# Patient Record
Sex: Female | Born: 1955 | Race: White | Hispanic: No | Smoking: Never smoker
Health system: Southern US, Community
[De-identification: ages and names within clinical notes are randomized; demographics above are authoritative.]

## PROBLEM LIST (undated history)

## (undated) DIAGNOSIS — K219 Gastro-esophageal reflux disease without esophagitis: Secondary | ICD-10-CM

## (undated) DIAGNOSIS — K5790 Diverticulosis of intestine, part unspecified, without perforation or abscess without bleeding: Secondary | ICD-10-CM

## (undated) DIAGNOSIS — S92819A Other fracture of unspecified foot, initial encounter for closed fracture: Secondary | ICD-10-CM

## (undated) DIAGNOSIS — T8859XA Other complications of anesthesia, initial encounter: Secondary | ICD-10-CM

## (undated) DIAGNOSIS — Z8719 Personal history of other diseases of the digestive system: Secondary | ICD-10-CM

## (undated) DIAGNOSIS — R112 Nausea with vomiting, unspecified: Secondary | ICD-10-CM

## (undated) DIAGNOSIS — Z800 Family history of malignant neoplasm of digestive organs: Secondary | ICD-10-CM

## (undated) DIAGNOSIS — T4145XA Adverse effect of unspecified anesthetic, initial encounter: Secondary | ICD-10-CM

## (undated) DIAGNOSIS — E669 Obesity, unspecified: Secondary | ICD-10-CM

## (undated) DIAGNOSIS — Z8489 Family history of other specified conditions: Secondary | ICD-10-CM

## (undated) DIAGNOSIS — M84376A Stress fracture, unspecified foot, initial encounter for fracture: Secondary | ICD-10-CM

## (undated) DIAGNOSIS — L409 Psoriasis, unspecified: Secondary | ICD-10-CM

## (undated) DIAGNOSIS — C801 Malignant (primary) neoplasm, unspecified: Secondary | ICD-10-CM

## (undated) DIAGNOSIS — K5730 Diverticulosis of large intestine without perforation or abscess without bleeding: Secondary | ICD-10-CM

## (undated) DIAGNOSIS — I10 Essential (primary) hypertension: Secondary | ICD-10-CM

## (undated) DIAGNOSIS — Z9889 Other specified postprocedural states: Secondary | ICD-10-CM

## (undated) HISTORY — PX: CHOLECYSTECTOMY: SHX55

## (undated) HISTORY — DX: Obesity, unspecified: E66.9

## (undated) HISTORY — PX: APPENDECTOMY: SHX54

## (undated) HISTORY — DX: Family history of malignant neoplasm of digestive organs: Z80.0

## (undated) HISTORY — PX: TONSILLECTOMY: SUR1361

## (undated) HISTORY — PX: OTHER SURGICAL HISTORY: SHX169

## (undated) HISTORY — DX: Essential (primary) hypertension: I10

## (undated) HISTORY — DX: Diverticulosis of large intestine without perforation or abscess without bleeding: K57.30

## (undated) HISTORY — DX: Stress fracture, unspecified foot, initial encounter for fracture: M84.376A

## (undated) HISTORY — DX: Gastro-esophageal reflux disease without esophagitis: K21.9

## (undated) HISTORY — DX: Psoriasis, unspecified: L40.9

## (undated) HISTORY — DX: Diverticulosis of intestine, part unspecified, without perforation or abscess without bleeding: K57.90

## (undated) HISTORY — DX: Other fracture of unspecified foot, initial encounter for closed fracture: S92.819A

## (undated) HISTORY — DX: Personal history of other diseases of the digestive system: Z87.19

## (undated) HISTORY — PX: ABDOMINAL HYSTERECTOMY: SHX81

---
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
## 1998-01-04 ENCOUNTER — Emergency Department (HOSPITAL_COMMUNITY): Admission: EM | Admit: 1998-01-04 | Discharge: 1998-01-04 | Payer: Self-pay

## 1998-01-10 ENCOUNTER — Emergency Department (HOSPITAL_COMMUNITY): Admission: EM | Admit: 1998-01-10 | Discharge: 1998-01-10 | Payer: Self-pay

## 1998-01-11 ENCOUNTER — Emergency Department (HOSPITAL_COMMUNITY): Admission: EM | Admit: 1998-01-11 | Discharge: 1998-01-11 | Payer: Self-pay

## 1998-01-12 ENCOUNTER — Emergency Department (HOSPITAL_COMMUNITY): Admission: EM | Admit: 1998-01-12 | Discharge: 1998-01-12 | Payer: Self-pay

## 1998-01-13 ENCOUNTER — Emergency Department (HOSPITAL_COMMUNITY): Admission: EM | Admit: 1998-01-13 | Discharge: 1998-01-13 | Payer: Self-pay | Admitting: Emergency Medicine

## 1998-05-15 ENCOUNTER — Other Ambulatory Visit: Admission: RE | Admit: 1998-05-15 | Discharge: 1998-05-15 | Payer: Self-pay | Admitting: Gastroenterology

## 1998-06-12 ENCOUNTER — Other Ambulatory Visit: Admission: RE | Admit: 1998-06-12 | Discharge: 1998-06-12 | Payer: Self-pay | Admitting: *Deleted

## 1999-09-01 ENCOUNTER — Encounter: Payer: Self-pay | Admitting: *Deleted

## 1999-09-01 ENCOUNTER — Emergency Department (HOSPITAL_COMMUNITY): Admission: EM | Admit: 1999-09-01 | Discharge: 1999-09-01 | Payer: Self-pay | Admitting: Emergency Medicine

## 2000-02-11 ENCOUNTER — Emergency Department (HOSPITAL_COMMUNITY): Admission: EM | Admit: 2000-02-11 | Discharge: 2000-02-11 | Payer: Self-pay | Admitting: Emergency Medicine

## 2000-02-12 ENCOUNTER — Encounter: Payer: Self-pay | Admitting: Emergency Medicine

## 2000-03-03 ENCOUNTER — Encounter: Payer: Self-pay | Admitting: Surgery

## 2000-03-05 ENCOUNTER — Ambulatory Visit (HOSPITAL_COMMUNITY): Admission: RE | Admit: 2000-03-05 | Discharge: 2000-03-06 | Payer: Self-pay | Admitting: Surgery

## 2000-07-31 ENCOUNTER — Encounter (INDEPENDENT_AMBULATORY_CARE_PROVIDER_SITE_OTHER): Payer: Self-pay | Admitting: Specialist

## 2000-07-31 ENCOUNTER — Other Ambulatory Visit: Admission: RE | Admit: 2000-07-31 | Discharge: 2000-07-31 | Payer: Self-pay | Admitting: Gastroenterology

## 2001-05-10 ENCOUNTER — Emergency Department (HOSPITAL_COMMUNITY): Admission: EM | Admit: 2001-05-10 | Discharge: 2001-05-11 | Payer: Self-pay | Admitting: Emergency Medicine

## 2001-06-08 ENCOUNTER — Emergency Department (HOSPITAL_COMMUNITY): Admission: EM | Admit: 2001-06-08 | Discharge: 2001-06-08 | Payer: Self-pay | Admitting: *Deleted

## 2001-06-11 ENCOUNTER — Emergency Department (HOSPITAL_COMMUNITY): Admission: EM | Admit: 2001-06-11 | Discharge: 2001-06-11 | Payer: Self-pay

## 2003-03-28 ENCOUNTER — Encounter: Admission: RE | Admit: 2003-03-28 | Discharge: 2003-03-28 | Payer: Self-pay | Admitting: Gastroenterology

## 2003-04-24 ENCOUNTER — Emergency Department (HOSPITAL_COMMUNITY): Admission: EM | Admit: 2003-04-24 | Discharge: 2003-04-25 | Payer: Self-pay | Admitting: Internal Medicine

## 2003-05-02 ENCOUNTER — Encounter: Payer: Self-pay | Admitting: Internal Medicine

## 2003-06-19 ENCOUNTER — Emergency Department (HOSPITAL_COMMUNITY): Admission: EM | Admit: 2003-06-19 | Discharge: 2003-06-20 | Payer: Self-pay | Admitting: Emergency Medicine

## 2003-06-20 ENCOUNTER — Ambulatory Visit (HOSPITAL_COMMUNITY): Admission: RE | Admit: 2003-06-20 | Discharge: 2003-06-20 | Payer: Self-pay | Admitting: Internal Medicine

## 2003-06-26 ENCOUNTER — Encounter: Admission: RE | Admit: 2003-06-26 | Discharge: 2003-06-26 | Payer: Self-pay | Admitting: Neurosurgery

## 2003-09-02 ENCOUNTER — Emergency Department (HOSPITAL_COMMUNITY): Admission: EM | Admit: 2003-09-02 | Discharge: 2003-09-02 | Payer: Self-pay | Admitting: Emergency Medicine

## 2004-03-12 ENCOUNTER — Ambulatory Visit: Payer: Self-pay | Admitting: Internal Medicine

## 2004-05-14 ENCOUNTER — Ambulatory Visit: Payer: Self-pay | Admitting: Internal Medicine

## 2004-08-06 ENCOUNTER — Ambulatory Visit: Payer: Self-pay | Admitting: Internal Medicine

## 2004-11-05 ENCOUNTER — Ambulatory Visit: Payer: Self-pay | Admitting: Internal Medicine

## 2004-12-17 ENCOUNTER — Ambulatory Visit: Payer: Self-pay | Admitting: Internal Medicine

## 2005-01-19 ENCOUNTER — Emergency Department (HOSPITAL_COMMUNITY): Admission: EM | Admit: 2005-01-19 | Discharge: 2005-01-20 | Payer: Self-pay | Admitting: *Deleted

## 2005-01-22 ENCOUNTER — Emergency Department (HOSPITAL_COMMUNITY): Admission: EM | Admit: 2005-01-22 | Discharge: 2005-01-22 | Payer: Self-pay | Admitting: Emergency Medicine

## 2005-02-19 ENCOUNTER — Ambulatory Visit: Payer: Self-pay | Admitting: Internal Medicine

## 2005-03-18 ENCOUNTER — Ambulatory Visit: Payer: Self-pay | Admitting: Internal Medicine

## 2005-05-13 ENCOUNTER — Ambulatory Visit: Payer: Self-pay | Admitting: Internal Medicine

## 2005-06-10 ENCOUNTER — Ambulatory Visit: Payer: Self-pay | Admitting: Internal Medicine

## 2005-07-15 ENCOUNTER — Ambulatory Visit: Payer: Self-pay | Admitting: Internal Medicine

## 2005-09-16 ENCOUNTER — Ambulatory Visit: Payer: Self-pay | Admitting: Internal Medicine

## 2005-12-16 ENCOUNTER — Ambulatory Visit: Payer: Self-pay | Admitting: Internal Medicine

## 2006-03-17 ENCOUNTER — Ambulatory Visit: Payer: Self-pay | Admitting: Internal Medicine

## 2006-06-16 ENCOUNTER — Ambulatory Visit: Payer: Self-pay | Admitting: Internal Medicine

## 2006-08-11 ENCOUNTER — Ambulatory Visit: Payer: Self-pay | Admitting: Internal Medicine

## 2006-09-29 DIAGNOSIS — K219 Gastro-esophageal reflux disease without esophagitis: Secondary | ICD-10-CM

## 2006-09-29 DIAGNOSIS — I872 Venous insufficiency (chronic) (peripheral): Secondary | ICD-10-CM

## 2006-09-29 DIAGNOSIS — J45909 Unspecified asthma, uncomplicated: Secondary | ICD-10-CM

## 2006-09-29 DIAGNOSIS — L409 Psoriasis, unspecified: Secondary | ICD-10-CM

## 2006-10-27 ENCOUNTER — Ambulatory Visit: Payer: Self-pay | Admitting: Internal Medicine

## 2007-01-26 ENCOUNTER — Ambulatory Visit: Payer: Self-pay | Admitting: Internal Medicine

## 2007-06-01 ENCOUNTER — Ambulatory Visit: Payer: Self-pay | Admitting: Internal Medicine

## 2007-06-01 DIAGNOSIS — I10 Essential (primary) hypertension: Secondary | ICD-10-CM

## 2007-06-07 ENCOUNTER — Emergency Department (HOSPITAL_COMMUNITY): Admission: EM | Admit: 2007-06-07 | Discharge: 2007-06-08 | Payer: Self-pay | Admitting: Emergency Medicine

## 2007-08-31 ENCOUNTER — Ambulatory Visit: Payer: Self-pay | Admitting: Internal Medicine

## 2007-11-23 ENCOUNTER — Ambulatory Visit: Payer: Self-pay | Admitting: Internal Medicine

## 2007-11-23 LAB — CONVERTED CEMR LAB
ALT: 27 units/L (ref 0–35)
Alkaline Phosphatase: 89 units/L (ref 39–117)
BUN: 12 mg/dL (ref 6–23)
Bilirubin, Direct: 0.1 mg/dL (ref 0.0–0.3)
CO2: 32 meq/L (ref 19–32)
Eosinophils Absolute: 0.2 10*3/uL (ref 0.0–0.7)
Glucose, Bld: 92 mg/dL (ref 70–99)
HCT: 36.7 % (ref 36.0–46.0)
HDL: 57.4 mg/dL (ref >39.0)
Lymphocytes Relative: 24.9 % (ref 12.0–46.0)
MCHC: 34.1 g/dL (ref 30.0–36.0)
Monocytes Absolute: 0.4 10*3/uL (ref 0.1–1.0)
Neutro Abs: 3.7 10*3/uL (ref 1.4–7.7)
Nitrite: NEGATIVE
RBC: 4.12 M/uL (ref 3.87–5.11)
RDW: 13.8 % (ref 11.5–14.6)
Sodium: 141 meq/L (ref 135–145)
TSH: 3.27 microintl units/mL (ref 0.35–5.50)
Total Bilirubin: 0.6 mg/dL (ref 0.3–1.2)
Triglycerides: 95 mg/dL (ref 0–149)
Urobilinogen, UA: 0.2
VLDL: 19 mg/dL (ref 0–40)
WBC: 5.7 10*3/uL (ref 4.5–10.5)
pH: 5.5

## 2007-11-30 ENCOUNTER — Ambulatory Visit: Payer: Self-pay | Admitting: Internal Medicine

## 2007-12-07 ENCOUNTER — Encounter: Admission: RE | Admit: 2007-12-07 | Discharge: 2007-12-07 | Payer: Self-pay | Admitting: Internal Medicine

## 2008-02-29 ENCOUNTER — Ambulatory Visit: Payer: Self-pay | Admitting: Internal Medicine

## 2008-02-29 DIAGNOSIS — J449 Chronic obstructive pulmonary disease, unspecified: Secondary | ICD-10-CM

## 2008-02-29 DIAGNOSIS — R05 Cough: Secondary | ICD-10-CM

## 2008-03-30 ENCOUNTER — Telehealth (INDEPENDENT_AMBULATORY_CARE_PROVIDER_SITE_OTHER): Payer: Self-pay | Admitting: *Deleted

## 2008-03-31 ENCOUNTER — Encounter (INDEPENDENT_AMBULATORY_CARE_PROVIDER_SITE_OTHER): Payer: Self-pay | Admitting: *Deleted

## 2008-04-11 ENCOUNTER — Ambulatory Visit: Payer: Self-pay | Admitting: Gastroenterology

## 2008-04-26 ENCOUNTER — Ambulatory Visit: Payer: Self-pay | Admitting: Gastroenterology

## 2008-04-26 LAB — HM COLONOSCOPY

## 2008-05-30 ENCOUNTER — Ambulatory Visit: Payer: Self-pay | Admitting: Internal Medicine

## 2008-05-30 DIAGNOSIS — K5730 Diverticulosis of large intestine without perforation or abscess without bleeding: Secondary | ICD-10-CM | POA: Insufficient documentation

## 2008-05-30 DIAGNOSIS — Z8719 Personal history of other diseases of the digestive system: Secondary | ICD-10-CM

## 2008-05-30 DIAGNOSIS — M79609 Pain in unspecified limb: Secondary | ICD-10-CM

## 2008-05-30 HISTORY — DX: Diverticulosis of large intestine without perforation or abscess without bleeding: K57.30

## 2008-05-30 HISTORY — DX: Personal history of other diseases of the digestive system: Z87.19

## 2008-07-05 ENCOUNTER — Encounter: Payer: Self-pay | Admitting: Internal Medicine

## 2008-08-22 ENCOUNTER — Telehealth: Payer: Self-pay | Admitting: Gastroenterology

## 2008-11-28 ENCOUNTER — Ambulatory Visit: Payer: Self-pay | Admitting: Internal Medicine

## 2008-11-28 LAB — CONVERTED CEMR LAB
ALT: 25 units/L (ref 0–35)
BUN: 10 mg/dL (ref 6–23)
Basophils Relative: 0.5 % (ref 0.0–3.0)
Bilirubin Urine: NEGATIVE
CO2: 30 meq/L (ref 19–32)
Creatinine, Ser: 0.6 mg/dL (ref 0.4–1.2)
Eosinophils Relative: 3 % (ref 0.0–5.0)
HCT: 38 % (ref 36.0–46.0)
HDL: 51.6 mg/dL (ref >39.00)
Ketones, urine, test strip: NEGATIVE
LDL Cholesterol: 120 mg/dL — ABNORMAL HIGH (ref 0–99)
Lymphs Abs: 1.7 10*3/uL (ref 0.7–4.0)
MCHC: 33.6 g/dL (ref 30.0–36.0)
MCV: 91.9 fL (ref 78.0–100.0)
Monocytes Absolute: 0.3 10*3/uL (ref 0.1–1.0)
Monocytes Relative: 4.8 % (ref 3.0–12.0)
Neutro Abs: 3.8 10*3/uL (ref 1.4–7.7)
Neutrophils Relative %: 64.2 % (ref 43.0–77.0)
RBC: 4.13 M/uL (ref 3.87–5.11)
Sodium: 144 meq/L (ref 135–145)
Specific Gravity, Urine: 1.02
Total Bilirubin: 0.9 mg/dL (ref 0.3–1.2)
Total CHOL/HDL Ratio: 4
Triglycerides: 115 mg/dL (ref 0.0–149.0)
WBC Urine, dipstick: NEGATIVE

## 2008-12-05 ENCOUNTER — Ambulatory Visit: Payer: Self-pay | Admitting: Internal Medicine

## 2008-12-05 DIAGNOSIS — M8580 Other specified disorders of bone density and structure, unspecified site: Secondary | ICD-10-CM

## 2008-12-05 DIAGNOSIS — M84376A Stress fracture, unspecified foot, initial encounter for fracture: Secondary | ICD-10-CM

## 2008-12-05 HISTORY — DX: Stress fracture, unspecified foot, initial encounter for fracture: M84.376A

## 2008-12-12 ENCOUNTER — Encounter: Payer: Self-pay | Admitting: Internal Medicine

## 2008-12-12 ENCOUNTER — Ambulatory Visit: Payer: Self-pay | Admitting: Family Medicine

## 2009-03-06 ENCOUNTER — Ambulatory Visit: Payer: Self-pay | Admitting: Internal Medicine

## 2009-04-07 ENCOUNTER — Emergency Department (HOSPITAL_COMMUNITY): Admission: EM | Admit: 2009-04-07 | Discharge: 2009-04-08 | Payer: Self-pay | Admitting: Emergency Medicine

## 2009-04-09 ENCOUNTER — Telehealth: Payer: Self-pay | Admitting: Internal Medicine

## 2009-04-10 ENCOUNTER — Ambulatory Visit: Payer: Self-pay | Admitting: Family Medicine

## 2009-04-24 ENCOUNTER — Ambulatory Visit: Payer: Self-pay | Admitting: Internal Medicine

## 2009-04-24 LAB — CONVERTED CEMR LAB
BUN: 7 mg/dL (ref 6–23)
CO2: 33 meq/L — ABNORMAL HIGH (ref 19–32)
Calcium: 8.9 mg/dL (ref 8.4–10.5)
Chloride: 104 meq/L (ref 96–112)
Creatinine, Ser: 0.7 mg/dL (ref 0.4–1.2)
GFR calc non Af Amer: 92.11 mL/min (ref >60)
Glucose, Bld: 96 mg/dL (ref 70–99)
Potassium: 3.6 meq/L (ref 3.5–5.1)
Sodium: 141 meq/L (ref 135–145)

## 2009-04-30 LAB — CONVERTED CEMR LAB: Vit D, 25-Hydroxy: 17 ng/mL — ABNORMAL LOW (ref 30–89)

## 2009-05-01 ENCOUNTER — Ambulatory Visit: Payer: Self-pay | Admitting: Internal Medicine

## 2009-05-15 ENCOUNTER — Telehealth: Payer: Self-pay | Admitting: Internal Medicine

## 2009-06-26 ENCOUNTER — Ambulatory Visit: Payer: Self-pay | Admitting: Internal Medicine

## 2009-06-26 DIAGNOSIS — B070 Plantar wart: Secondary | ICD-10-CM

## 2009-06-26 LAB — CONVERTED CEMR LAB
Basophils Absolute: 0 10*3/uL (ref 0.0–0.1)
Basophils Relative: 0.3 % (ref 0.0–3.0)
CO2: 33 meq/L — ABNORMAL HIGH (ref 19–32)
Creatinine, Ser: 0.5 mg/dL (ref 0.4–1.2)
Eosinophils Relative: 1.9 % (ref 0.0–5.0)
Glucose, Bld: 96 mg/dL (ref 70–99)
Hemoglobin: 13 g/dL (ref 12.0–15.0)
Lymphocytes Relative: 27.2 % (ref 12.0–46.0)
Monocytes Absolute: 0.4 10*3/uL (ref 0.1–1.0)
Monocytes Relative: 5.5 % (ref 3.0–12.0)
Neutro Abs: 4.8 10*3/uL (ref 1.4–7.7)
Platelets: 239 10*3/uL (ref 150.0–400.0)
Potassium: 4.1 meq/L (ref 3.5–5.1)
Sodium: 143 meq/L (ref 135–145)
WBC: 7.3 10*3/uL (ref 4.5–10.5)

## 2009-07-02 LAB — CONVERTED CEMR LAB: Vit D, 25-Hydroxy: 19 ng/mL — ABNORMAL LOW (ref 30–89)

## 2009-09-24 ENCOUNTER — Ambulatory Visit: Payer: Self-pay | Admitting: Internal Medicine

## 2009-09-24 DIAGNOSIS — N3281 Overactive bladder: Secondary | ICD-10-CM

## 2009-09-24 LAB — CONVERTED CEMR LAB
Eosinophils Absolute: 0.2 10*3/uL (ref 0.0–0.7)
Eosinophils Relative: 2.5 % (ref 0.0–5.0)
GFR calc non Af Amer: 142.15 mL/min (ref >60)
Glucose, Bld: 96 mg/dL (ref 70–99)
HCT: 37.7 % (ref 36.0–46.0)
MCV: 92.1 fL (ref 78.0–100.0)
Monocytes Absolute: 0.4 10*3/uL (ref 0.1–1.0)
Neutro Abs: 4.1 10*3/uL (ref 1.4–7.7)
Platelets: 206 10*3/uL (ref 150.0–400.0)
Potassium: 4.1 meq/L (ref 3.5–5.1)
RBC: 4.1 M/uL (ref 3.87–5.11)
RDW: 14.5 % (ref 11.5–14.6)

## 2009-11-20 ENCOUNTER — Ambulatory Visit: Payer: Self-pay | Admitting: Internal Medicine

## 2009-11-20 DIAGNOSIS — E559 Vitamin D deficiency, unspecified: Secondary | ICD-10-CM

## 2009-11-20 LAB — CONVERTED CEMR LAB
BUN: 11 mg/dL (ref 6–23)
Chloride: 104 meq/L (ref 96–112)
GFR calc non Af Amer: 114.2 mL/min (ref >60)
Glucose, Bld: 85 mg/dL (ref 70–99)
Potassium: 3.5 meq/L (ref 3.5–5.1)
Sodium: 141 meq/L (ref 135–145)

## 2009-12-01 ENCOUNTER — Emergency Department (HOSPITAL_COMMUNITY): Admission: EM | Admit: 2009-12-01 | Discharge: 2009-12-02 | Payer: Self-pay | Admitting: Emergency Medicine

## 2010-02-19 ENCOUNTER — Ambulatory Visit
Admission: RE | Admit: 2010-02-19 | Discharge: 2010-02-19 | Payer: Self-pay | Source: Home / Self Care | Attending: Internal Medicine | Admitting: Internal Medicine

## 2010-03-21 NOTE — Assessment & Plan Note (Signed)
Summary: ER follow up for bacterial inf in toe/per [place]e/cjr   Vital Signs:  Patient profile:   55 year old female Temp:     98 degrees F oral BP sitting:   120 / 78  (left arm) Cuff size:   large  Vitals Entered By: Sid Falcon LPN (April 10, 2009 9:30 AM) CC: Right middle toe infection follow-up   History of Present Illness: This patient is seen for urgent care followup. Last Thursday she noticed redness and a couple of pimple-like lesions right third toe. Patient went to urgent care placed on Bactrim. No cultures done. Antibiotics were started Saturday. Toe improved since then. No history of MRSA. Denies any fevers or chills. No history diabetes  Allergies: 1)  ! Advil (Ibuprofen) 2)  ! Aspirin 3)  ! Hyzaar (Losartan Potassium-Hctz) 4)  ! Strawberry Flavor (Flavoring Agent) 5)  ! * Golytely  Past History:  Past Medical History: Last updated: 12/05/2008 Asthma obesity psorisis venous stasis dematitis GERD Hypertension Diverticulitis, hx of Diverticulosis, colon stress fracture of the foot PMH reviewed for relevance  Review of Systems      See HPI  Physical Exam  General:  Well-developed,well-nourished,in no acute distress; alert,appropriate and cooperative throughout examination Extremities:  right third toe reveals minimal erythema distally. She has one very small pimple-like lesion which appears to be drying up. No fluctuance. No tenderness. No erythema involving the foot   Impression & Recommendations:  Problem # 1:  CELLULITIS, TOE (ICD-681.10) Assessment Improved finish out antibiotics. Frequent elevation. Follow up as needed  Complete Medication List: 1)  Enalapril-hydrochlorothiazide 5-12.5 Mg Tabs (Enalapril-hydrochlorothiazide) .... Once daily 2)  Calcium-vitamin D 600-200 Mg-unit Tabs (Calcium-vitamin d) .... Once daily 3)  Singulair 10 Mg Tabs (Montelukast sodium) .... Take 1 tablet by mouth once a day 4)  Advair Diskus 250-50 Mcg/dose Misc  (Fluticasone-salmeterol) .... Two times a day 5)  Furosemide 40 Mg Tabs (Furosemide) .... Once daily 6)  Multivitamins Caps (Multiple vitamin) .... Once daily 7)  Proair Hfa 108 (90 Base) Mcg/act Aers (Albuterol sulfate) .... As needed 8)  Ergocalciferol 50000 Unit Caps (Ergocalciferol) .... One by mouth weekly 9)  Tramadol Hcl 50 Mg Tabs (Tramadol hcl) .... One by mouth three times a day as needed pain  Patient Instructions: 1)  elevate foot frequently. 2)  Finish out antibiotics 3)  Follow up promptly if you notice any increased redness, warmth, or fever.

## 2010-03-21 NOTE — Progress Notes (Signed)
Summary: med request  Phone Note Call from Patient   Caller: Windy Canny, sister-in-law, [phone] Summary of Call: Mother passed yesterday.  Requesting med to help with the situation, a little something to take the edge off.  All tore up & give out, sitting at hospital a week +.  [facility] [address]  Allergic to aloe vera & see list.   Initial call taken by: Rudy Jew, RN,  May 15, 2009 9:29 AM  Follow-up for Phone Call        per dr Lovell Sheehan- may have xanax.25mg , #20-1 every 4-6 hours as needed for axiety Follow-up by: Willy Eddy, LPN,  May 15, 2009 9:34 AM  Additional Follow-up for Phone Call Additional follow up Details #1::        Phone Call Completed Additional Follow-up by: Rudy Jew, RN,  May 15, 2009 9:48 AM    New/Updated Medications: ALPRAZOLAM 0.25 MG TABS (ALPRAZOLAM) One every 4-6 hours as needed anxiety Prescriptions: ALPRAZOLAM 0.25 MG TABS (ALPRAZOLAM) One every 4-6 hours as needed anxiety  #20 x 0   Entered by:   Rudy Jew, RN   Authorized by:   Stacie Glaze MD   Signed by:   Rudy Jew, RN on 05/15/2009   Method used:   Telephoned to ...       [facility] [address] [phone]* (retail)       [address]       [place], [place]  [address]       Ph: [phone]       Fax: [phone]   RxID:   [id number]

## 2010-03-21 NOTE — Assessment & Plan Note (Signed)
Summary: 3 month fup//ccm   Vital Signs:  Patient profile:   55 year old female Height:      59 inches Weight:      284 pounds BMI:     57.57 Temp:     98.2 degrees F oral Pulse rate:   76 / minute Resp:     16 per minute BP sitting:   140 / 80  (left arm) Cuff size:   large  Vitals Entered By: Willy Eddy, LPN (February 19, 2010 11:52 AM) CC: roa, Hypertension Management Is Patient Diabetic? No   Primary Care Provider:  Stacie Glaze MD  CC:  roa and Hypertension Management.  History of Present Illness: Asthma has been stable but has a cold with increased cough thta has flaired her asthma no fever or chills The pt was exposed by her grandchild with a virus.... the infection started with a sore throat HTN has been stable Has been on dayquil for the cough   Hypertension History:      She denies headache, chest pain, palpitations, dyspnea with exertion, orthopnea, PND, peripheral edema, visual symptoms, neurologic problems, syncope, and side effects from treatment.        Positive major cardiovascular risk factors include female age 43 years old or older and hypertension.  Negative major cardiovascular risk factors include non-tobacco-user status.     Preventive Screening-Counseling & Management  Alcohol-Tobacco     Smoking Status: never  Problems Prior to Update: 1)  Unspecified Vitamin D Deficiency  (ICD-268.9) 2)  Urinary Incontinence, Stress, Female  (ICD-625.6) 3)  Plantar Wart, Right  (ICD-078.12) 4)  Osteopenia  (ICD-733.90) 5)  Stress Fracture, Foot  (ICD-733.94) 6)  Foot Pain, Bilateral  (ICD-729.5) 7)  Diverticulosis, Colon  (ICD-562.10) 8)  Diverticulitis, Hx of  (ICD-V12.79) 9)  Chronic Obstructive Asthma With Exacerbation  (ICD-493.22) 10)  Cough  (ICD-786.2) 11)  Preventive Health Care  (ICD-V70.0) 12)  Stasis Dermatitis, Chronic  (ICD-459.81) 13)  Hypertension  (ICD-401.9) 14)  Family History of Cervical Cancer  (ICD-V17.3) 15)   Insufficiency, Venous Nos  (ICD-459.81) 16)  Psoriasis  (ICD-696.1) 17)  Obesity, Morbid  (ICD-278.01) 18)  Gerd  (ICD-530.81) 19)  Asthma  (ICD-493.90)  Current Problems (verified): 1)  Unspecified Vitamin D Deficiency  (ICD-268.9) 2)  Urinary Incontinence, Stress, Female  (ICD-625.6) 3)  Plantar Wart, Right  (ICD-078.12) 4)  Osteopenia  (ICD-733.90) 5)  Stress Fracture, Foot  (ICD-733.94) 6)  Foot Pain, Bilateral  (ICD-729.5) 7)  Diverticulosis, Colon  (ICD-562.10) 8)  Diverticulitis, Hx of  (ICD-V12.79) 9)  Chronic Obstructive Asthma With Exacerbation  (ICD-493.22) 10)  Cough  (ICD-786.2) 11)  Preventive Health Care  (ICD-V70.0) 12)  Stasis Dermatitis, Chronic  (ICD-459.81) 13)  Hypertension  (ICD-401.9) 14)  Family History of Cervical Cancer  (ICD-V17.3) 15)  Insufficiency, Venous Nos  (ICD-459.81) 16)  Psoriasis  (ICD-696.1) 17)  Obesity, Morbid  (ICD-278.01) 18)  Gerd  (ICD-530.81) 19)  Asthma  (ICD-493.90)  Medications Prior to Update: 1)  Enalapril-Hydrochlorothiazide 5-12.5 Mg Tabs (Enalapril-Hydrochlorothiazide) .... Once Daily 2)  Calcium-Vitamin D 600-200 Mg-Unit  Tabs (Calcium-Vitamin D) .... Once Daily 3)  Singulair 10 Mg  Tabs (Montelukast Sodium) .... Take 1 Tablet By Mouth Once A Day 4)  Advair Diskus 250-50 Mcg/dose  Misc (Fluticasone-Salmeterol) .... Two Times A Day 5)  Furosemide 40 Mg  Tabs (Furosemide) .... Once Daily 6)  Multivitamins   Caps (Multiple Vitamin) .... Once Daily 7)  Proair Hfa 108 (90 Base) Mcg/act  Aers (Albuterol Sulfate) .... As Needed 8)  Ergocalciferol 50000 Unit Caps (Ergocalciferol) .... One By Mouth Twice A Week On Monday and Thursday 9)  Tramadol Hcl 50 Mg Tabs (Tramadol Hcl) .... One By Mouth Three Times A Day As Needed Pain 10)  B Complex-B12  Tabs (B Complex Vitamins) .Marland Kitchen.. 1 Once Daily 11)  Vesicare 5 Mg Tabs (Solifenacin Succinate) .... One By Mouth Daily 12)  Vitamin D 1000 Unit Tabs (Cholecalciferol) .... 2 Once Daily 13)   Boniva 150 Mg Tabs (Ibandronate Sodium) .... One By Mouth Each Monht On The Same Calendar Day 30 Min Before Eating in The Am  Current Medications (verified): 1)  Enalapril-Hydrochlorothiazide 5-12.5 Mg Tabs (Enalapril-Hydrochlorothiazide) .... Once Daily 2)  Calcium-Vitamin D 600-200 Mg-Unit  Tabs (Calcium-Vitamin D) .... Once Daily 3)  Singulair 10 Mg  Tabs (Montelukast Sodium) .... Take 1 Tablet By Mouth Once A Day 4)  Advair Diskus 250-50 Mcg/dose  Misc (Fluticasone-Salmeterol) .... Two Times A Day 5)  Furosemide 40 Mg  Tabs (Furosemide) .... Once Daily 6)  Multivitamins   Caps (Multiple Vitamin) .... Once Daily 7)  Proair Hfa 108 (90 Base) Mcg/act  Aers (Albuterol Sulfate) .... As Needed 8)  Ergocalciferol 50000 Unit Caps (Ergocalciferol) .... One By Mouth Twice A Week On Monday and Thursday 9)  Tramadol Hcl 50 Mg Tabs (Tramadol Hcl) .... One By Mouth Three Times A Day As Needed Pain 10)  B Complex-B12  Tabs (B Complex Vitamins) .Marland Kitchen.. 1 Once Daily 11)  Vesicare 5 Mg Tabs (Solifenacin Succinate) .... One By Mouth Daily 12)  Vitamin D 1000 Unit Tabs (Cholecalciferol) .... 2 Once Daily 13)  Boniva 150 Mg Tabs (Ibandronate Sodium) .... One By Mouth Each Monht On The Same Calendar Day 30 Min Before Eating in The Am  Allergies (verified): 1)  ! Advil (Ibuprofen) 2)  ! Aspirin 3)  ! Hyzaar (Losartan Potassium-Hctz) 4)  ! Strawberry Flavor (Flavoring Agent) 5)  ! * Golytely  Past History:  Family History: Last updated: 10/27/2006 mother Family History Hypertension obesity Family History of Stroke F 1st degree relative  father died from CAD in 67's  Social History: Last updated: 10/27/2006 Married Never Smoked Alcohol use-no Drug use-no Regular exercise-no  Risk Factors: Exercise: no (10/27/2006)  Risk Factors: Smoking Status: never (02/19/2010)  Past medical, surgical, family and social histories (including risk factors) reviewed, and no changes noted (except as noted  below).  Past Medical History: Reviewed history from 12/05/2008 and no changes required. Asthma obesity psorisis venous stasis dematitis GERD Hypertension Diverticulitis, hx of Diverticulosis, colon stress fracture of the foot  Past Surgical History: Reviewed history from 10/27/2006 and no changes required. Appendectomy Cholecystectomy Hysterectomy Tonsillectomy arthroscopic surgery to knee  Family History: Reviewed history from 10/27/2006 and no changes required. mother Family History Hypertension obesity Family History of Stroke F 1st degree relative  father died from CAD in 82's  Social History: Reviewed history from 10/27/2006 and no changes required. Married Never Smoked Alcohol use-no Drug use-no Regular exercise-no  Review of Systems       The patient complains of weight gain, dyspnea on exertion, prolonged cough, and headaches.  The patient denies anorexia, fever, weight loss, vision loss, decreased hearing, hoarseness, chest pain, syncope, peripheral edema, hemoptysis, abdominal pain, melena, hematochezia, severe indigestion/heartburn, hematuria, incontinence, genital sores, muscle weakness, suspicious skin lesions, transient blindness, difficulty walking, depression, unusual weight change, abnormal bleeding, enlarged lymph nodes, angioedema, breast masses, and testicular masses.    Physical Exam  General:  alert and overweight-appearing.   Head:  normocephalic and atraumatic.   Eyes:  pupils equal and pupils round.   Ears:  R ear normal and L ear normal.   Nose:  mucosal erythema and mucosal edema.   Mouth:  pharynx pink and moist and no erythema.   Neck:  No deformities, masses, or tenderness noted. Lungs:  normal respiratory effort and no wheezes.     Impression & Recommendations:  Problem # 1:  COUGH (ICD-786.2) Assessment Deteriorated moderately severe  Problem # 2:  CHRONIC OBSTRUCTIVE ASTHMA WITH EXACERBATION (ZOX-096.04) Assessment:  Deteriorated samples given Her updated medication list for this problem includes:    Singulair 10 Mg Tabs (Montelukast sodium) .Marland Kitchen... Take 1 tablet by mouth once a day    Advair Diskus 250-50 Mcg/dose Misc (Fluticasone-salmeterol) .Marland Kitchen..Marland Kitchen Two times a day    Proair Hfa 108 (90 Base) Mcg/act Aers (Albuterol sulfate) .Marland Kitchen... As needed  Current Asthma Management Plan:  Green Zone:  ADVAIR DISKUS 250-50 MCG/DOSE  MISC SINGULAIR 10 MG  TABS Yellow Zone:  PROAIR HFA 108 (90 BASE) MCG/ACT  AERS:  2 puffs every 3-4 hours (yellow zone)  Problem # 3:  HYPERTENSION (ICD-401.9) Assessment: Unchanged  Her updated medication list for this problem includes:    Enalapril-hydrochlorothiazide 5-12.5 Mg Tabs (Enalapril-hydrochlorothiazide) ..... Once daily    Furosemide 40 Mg Tabs (Furosemide) ..... Once daily  BP today: 140/80 Prior BP: 140/80 (11/20/2009)  Prior 10 Yr Risk Heart Disease: 11 % (06/26/2009)  Labs Reviewed: K+: 3.5 (11/20/2009) Creat: : 0.6 (11/20/2009)   Chol: 195 (11/28/2008)   HDL: 51.60 (11/28/2008)   LDL: 120 (11/28/2008)   TG: 115.0 (11/28/2008)  Complete Medication List: 1)  Enalapril-hydrochlorothiazide 5-12.5 Mg Tabs (Enalapril-hydrochlorothiazide) .... Once daily 2)  Calcium-vitamin D 600-200 Mg-unit Tabs (Calcium-vitamin d) .... Once daily 3)  Singulair 10 Mg Tabs (Montelukast sodium) .... Take 1 tablet by mouth once a day 4)  Advair Diskus 250-50 Mcg/dose Misc (Fluticasone-salmeterol) .... Two times a day 5)  Furosemide 40 Mg Tabs (Furosemide) .... Once daily 6)  Multivitamins Caps (Multiple vitamin) .... Once daily 7)  Proair Hfa 108 (90 Base) Mcg/act Aers (Albuterol sulfate) .... As needed 8)  Ergocalciferol 50000 Unit Caps (Ergocalciferol) .... One by mouth twice a week on monday and thursday 9)  Tramadol Hcl 50 Mg Tabs (Tramadol hcl) .... One by mouth three times a day as needed pain 10)  B Complex-b12 Tabs (B complex vitamins) .Marland Kitchen.. 1 once daily 11)  Vesicare 5 Mg  Tabs (Solifenacin succinate) .... One by mouth daily 12)  Vitamin D 1000 Unit Tabs (Cholecalciferol) .... 2 once daily 13)  Boniva 150 Mg Tabs (Ibandronate sodium) .... One by mouth each monht on the same calendar day 30 min before eating in the am 14)  Amoxicillin 500 Mg Caps (Amoxicillin) .... One by mouth three times a day for 10 days  Asthma Management Plan    Asthma Severity: Mild Persistent    Control Assessment: Not Well Controlled    Plan based on PEF formula: [place] and Dinah Beers Zone:ADVAIR DISKUS 250-50 MCG/DOSE  MISC SINGULAIR 10 MG  TABS  Yellow Zone: PROAIR HFA 108 (90 BASE) MCG/ACT  AERS:  2 puffs every 3-4 hours (yellow zone)  Hypertension Assessment/Plan:      The patient's hypertensive risk group is category B: At least one risk factor (excluding diabetes) with no target organ damage.  Her calculated 10 year risk of coronary heart disease is 11 %.  Today's blood pressure  is 140/80.  Her blood pressure goal is < 140/90.  Patient Instructions: 1)  Please schedule a follow-up appointment in 3 months. Prescriptions: AMOXICILLIN 500 MG CAPS (AMOXICILLIN) one by mouth three times a day for 10 days  #30 x 0   Entered and Authorized by:   Stacie Glaze MD   Signed by:   Stacie Glaze MD on 02/19/2010   Method used:   Electronically to        [facility] [phone]* (retail)       [address]       [place], [place]  [address]       Ph: [phone]       Fax: [phone]   RxID:   [id number]    Orders Added: 1)  Est. Patient Level IV [27253]

## 2010-03-21 NOTE — Assessment & Plan Note (Signed)
Summary: 2 month fup//ccm   Vital Signs:  Patient profile:   55 year old female Height:      59 inches Weight:      286 pounds BMI:     57.97 Temp:     98.2 degrees F oral Pulse rate:   76 / minute Resp:     16 per minute BP sitting:   140 / 80  (left arm)  Vitals Entered By: Willy Eddy, LPN (November 20, 2009 11:47 AM) CC: roa, Hypertension Management Is Patient Diabetic? No   Primary Care Provider:  Stacie Glaze MD  CC:  roa and Hypertension Management.  History of Present Illness: had been on 400 mg and has been taking 5 a day until she uses them up ( ran out sunday) she was treated for stress fracture she has been seeing the podiatriast she foot pain has worsened increased pain in foot and increased swelling Bone density showed osteoposis and osteopenia   Hypertension History:      She denies headache, chest pain, palpitations, dyspnea with exertion, orthopnea, PND, peripheral edema, visual symptoms, neurologic problems, syncope, and side effects from treatment.        Positive major cardiovascular risk factors include female age 67 years old or older and hypertension.  Negative major cardiovascular risk factors include non-tobacco-user status.     Preventive Screening-Counseling & Management  Alcohol-Tobacco     Smoking Status: never  Problems Prior to Update: 1)  Urinary Incontinence, Stress, Female  (ICD-625.6) 2)  Plantar Wart, Right  (ICD-078.12) 3)  Osteopenia  (ICD-733.90) 4)  Stress Fracture, Foot  (ICD-733.94) 5)  Foot Pain, Bilateral  (ICD-729.5) 6)  Diverticulosis, Colon  (ICD-562.10) 7)  Diverticulitis, Hx of  (ICD-V12.79) 8)  Chronic Obstructive Asthma With Exacerbation  (ICD-493.22) 9)  Cough  (ICD-786.2) 10)  Preventive Health Care  (ICD-V70.0) 11)  Stasis Dermatitis, Chronic  (ICD-459.81) 12)  Hypertension  (ICD-401.9) 13)  Family History of Cervical Cancer  (ICD-V17.3) 14)  Insufficiency, Venous Nos  (ICD-459.81) 15)  Psoriasis   (ICD-696.1) 16)  Obesity, Morbid  (ICD-278.01) 17)  Gerd  (ICD-530.81) 18)  Asthma  (ICD-493.90)  Current Problems (verified): 1)  Urinary Incontinence, Stress, Female  (ICD-625.6) 2)  Plantar Wart, Right  (ICD-078.12) 3)  Osteopenia  (ICD-733.90) 4)  Stress Fracture, Foot  (ICD-733.94) 5)  Foot Pain, Bilateral  (ICD-729.5) 6)  Diverticulosis, Colon  (ICD-562.10) 7)  Diverticulitis, Hx of  (ICD-V12.79) 8)  Chronic Obstructive Asthma With Exacerbation  (ICD-493.22) 9)  Cough  (ICD-786.2) 10)  Preventive Health Care  (ICD-V70.0) 11)  Stasis Dermatitis, Chronic  (ICD-459.81) 12)  Hypertension  (ICD-401.9) 13)  Family History of Cervical Cancer  (ICD-V17.3) 14)  Insufficiency, Venous Nos  (ICD-459.81) 15)  Psoriasis  (ICD-696.1) 16)  Obesity, Morbid  (ICD-278.01) 17)  Gerd  (ICD-530.81) 18)  Asthma  (ICD-493.90)  Medications Prior to Update: 1)  Enalapril-Hydrochlorothiazide 5-12.5 Mg Tabs (Enalapril-Hydrochlorothiazide) .... Once Daily 2)  Calcium-Vitamin D 600-200 Mg-Unit  Tabs (Calcium-Vitamin D) .... Once Daily 3)  Singulair 10 Mg  Tabs (Montelukast Sodium) .... Take 1 Tablet By Mouth Once A Day 4)  Advair Diskus 250-50 Mcg/dose  Misc (Fluticasone-Salmeterol) .... Two Times A Day 5)  Furosemide 40 Mg  Tabs (Furosemide) .... Once Daily 6)  Multivitamins   Caps (Multiple Vitamin) .... Once Daily 7)  Proair Hfa 108 (90 Base) Mcg/act  Aers (Albuterol Sulfate) .... As Needed 8)  Ergocalciferol 50000 Unit Caps (Ergocalciferol) .... One By Mouth  Twice A Week On Monday and Thursday 9)  Tramadol Hcl 50 Mg Tabs (Tramadol Hcl) .... One By Mouth Three Times A Day As Needed Pain 10)  B Complex-B12  Tabs (B Complex Vitamins) .Marland Kitchen.. 1 Once Daily 11)  Vesicare 5 Mg Tabs (Solifenacin Succinate) .... One By Mouth Daily 12)  Vitamin D 1000 Unit Tabs (Cholecalciferol) .... 2 Once Daily  Current Medications (verified): 1)  Enalapril-Hydrochlorothiazide 5-12.5 Mg Tabs (Enalapril-Hydrochlorothiazide)  .... Once Daily 2)  Calcium-Vitamin D 600-200 Mg-Unit  Tabs (Calcium-Vitamin D) .... Once Daily 3)  Singulair 10 Mg  Tabs (Montelukast Sodium) .... Take 1 Tablet By Mouth Once A Day 4)  Advair Diskus 250-50 Mcg/dose  Misc (Fluticasone-Salmeterol) .... Two Times A Day 5)  Furosemide 40 Mg  Tabs (Furosemide) .... Once Daily 6)  Multivitamins   Caps (Multiple Vitamin) .... Once Daily 7)  Proair Hfa 108 (90 Base) Mcg/act  Aers (Albuterol Sulfate) .... As Needed 8)  Ergocalciferol 50000 Unit Caps (Ergocalciferol) .... One By Mouth Twice A Week On Monday and Thursday 9)  Tramadol Hcl 50 Mg Tabs (Tramadol Hcl) .... One By Mouth Three Times A Day As Needed Pain 10)  B Complex-B12  Tabs (B Complex Vitamins) .Marland Kitchen.. 1 Once Daily 11)  Vesicare 5 Mg Tabs (Solifenacin Succinate) .... One By Mouth Daily 12)  Vitamin D 1000 Unit Tabs (Cholecalciferol) .... 2 Once Daily 13)  Boniva 150 Mg Tabs (Ibandronate Sodium) .... One By Mouth Each Monht On The Same Calendar Day 30 Min Before Eating in The Am  Allergies (verified): 1)  ! Advil (Ibuprofen) 2)  ! Aspirin 3)  ! Hyzaar (Losartan Potassium-Hctz) 4)  ! Strawberry Flavor (Flavoring Agent) 5)  ! * Golytely  Past History:  Family History: Last updated: 10/27/2006 mother Family History Hypertension obesity Family History of Stroke F 1st degree relative  father died from CAD in 72's  Social History: Last updated: 10/27/2006 Married Never Smoked Alcohol use-no Drug use-no Regular exercise-no  Risk Factors: Exercise: no (10/27/2006)  Risk Factors: Smoking Status: never (11/20/2009)  Past medical, surgical, family and social histories (including risk factors) reviewed, and no changes noted (except as noted below).  Past Medical History: Reviewed history from 12/05/2008 and no changes required. Asthma obesity psorisis venous stasis dematitis GERD Hypertension Diverticulitis, hx of Diverticulosis, colon stress fracture of the foot  Past  Surgical History: Reviewed history from 10/27/2006 and no changes required. Appendectomy Cholecystectomy Hysterectomy Tonsillectomy arthroscopic surgery to knee  Family History: Reviewed history from 10/27/2006 and no changes required. mother Family History Hypertension obesity Family History of Stroke F 1st degree relative  father died from CAD in 56's  Social History: Reviewed history from 10/27/2006 and no changes required. Married Never Smoked Alcohol use-no Drug use-no Regular exercise-no  Review of Systems  The patient denies anorexia, fever, weight loss, weight gain, vision loss, decreased hearing, hoarseness, chest pain, syncope, dyspnea on exertion, peripheral edema, prolonged cough, headaches, hemoptysis, abdominal pain, melena, hematochezia, severe indigestion/heartburn, hematuria, incontinence, genital sores, muscle weakness, suspicious skin lesions, transient blindness, difficulty walking, depression, unusual weight change, abnormal bleeding, enlarged lymph nodes, angioedema, and breast masses.         Flu Vaccine Consent Questions     Do you have a history of severe allergic reactions to this vaccine? no    Any prior history of allergic reactions to egg and/or gelatin? no    Do you have a sensitivity to the preservative Thimersol? no    Do you have a past  history of Guillan-Barre Syndrome? no    Do you currently have an acute febrile illness? no    Have you ever had a severe reaction to latex? no    Vaccine information given and explained to patient? yes    Are you currently pregnant? no    Lot Number:[id number]   Exp Date:08/17/2010   Site Given  Left Deltoid IM   Physical Exam  General:  alert and overweight-appearing.   Head:  normocephalic and atraumatic.   Eyes:  pupils equal and pupils round.   Ears:  R ear normal and L ear normal.   Nose:  mucosal erythema and mucosal edema.   Neck:  No deformities, masses, or tenderness noted. Lungs:  normal  respiratory effort and no wheezes.   Heart:  normal rate and regular rhythm.   Abdomen:  soft, no rigidity, and distended.   Msk:  no joint warmth, joint tenderness, and joint swelling.   Extremities:  2+ left pedal edema and 2+ right pedal edema.   Neurologic:  alert & oriented X3 and abnormal gait.     Impression & Recommendations:  Problem # 1:  OSTEOPENIA (ICD-733.90)  Orders: Venipuncture (04540) T-Vitamin D (25-Hydroxy) (98119-14782)  Discussed medication use, applications of heat or ice, and exercises.   Her updated medication list for this problem includes:    Boniva 150 Mg Tabs (Ibandronate sodium) ..... One by mouth each monht on the same calendar day 30 min before eating in the am  Problem # 2:  STRESS FRACTURE, FOOT (ICD-733.94)  refer to orthopedist, failed the conservative approach of podiatry swelling does not get better after laying down and pain is worse Discussed medication use, applications of heat or ice, and exercises.   Her updated medication list for this problem includes:    Boniva 150 Mg Tabs (Ibandronate sodium) ..... One by mouth each monht on the same calendar day 30 min before eating in the am  Orders: Orthopedic Referral (Ortho)  Problem # 3:  CHRONIC OBSTRUCTIVE ASTHMA WITH EXACERBATION (NFA-213.08)  Her updated medication list for this problem includes:    Singulair 10 Mg Tabs (Montelukast sodium) .Marland Kitchen... Take 1 tablet by mouth once a day    Advair Diskus 250-50 Mcg/dose Misc (Fluticasone-salmeterol) .Marland Kitchen..Marland Kitchen Two times a day    Proair Hfa 108 (90 Base) Mcg/act Aers (Albuterol sulfate) .Marland Kitchen... As needed  Current Asthma Management Plan:  Green Zone:  ADVAIR DISKUS 250-50 MCG/DOSE  MISC SINGULAIR 10 MG  TABS Yellow Zone:  PROAIR HFA 108 (90 BASE) MCG/ACT  AERS:  2 puffs every 3-4 hours (yellow zone)  Problem # 4:  HYPERTENSION (ICD-401.9)  Her updated medication list for this problem includes:    Enalapril-hydrochlorothiazide 5-12.5 Mg Tabs  (Enalapril-hydrochlorothiazide) ..... Once daily    Furosemide 40 Mg Tabs (Furosemide) ..... Once daily  BP today: 140/80 Prior BP: 140/80 (09/24/2009)  Prior 10 Yr Risk Heart Disease: 11 % (06/26/2009)  Labs Reviewed: K+: 4.1 (09/24/2009) Creat: : 0.5 (09/24/2009)   Chol: 195 (11/28/2008)   HDL: 51.60 (11/28/2008)   LDL: 120 (11/28/2008)   TG: 115.0 (11/28/2008)  Orders: TLB-BMP (Basic Metabolic Panel-BMET) (80048-METABOL)  Problem # 5:  STASIS DERMATITIS, CHRONIC (ICD-459.81) worsening swelling in feet with pain  Complete Medication List: 1)  Enalapril-hydrochlorothiazide 5-12.5 Mg Tabs (Enalapril-hydrochlorothiazide) .... Once daily 2)  Calcium-vitamin D 600-200 Mg-unit Tabs (Calcium-vitamin d) .... Once daily 3)  Singulair 10 Mg Tabs (Montelukast sodium) .... Take 1 tablet by mouth once a day 4)  Advair Diskus  250-50 Mcg/dose Misc (Fluticasone-salmeterol) .... Two times a day 5)  Furosemide 40 Mg Tabs (Furosemide) .... Once daily 6)  Multivitamins Caps (Multiple vitamin) .... Once daily 7)  Proair Hfa 108 (90 Base) Mcg/act Aers (Albuterol sulfate) .... As needed 8)  Ergocalciferol 50000 Unit Caps (Ergocalciferol) .... One by mouth twice a week on monday and thursday 9)  Tramadol Hcl 50 Mg Tabs (Tramadol hcl) .... One by mouth three times a day as needed pain 10)  B Complex-b12 Tabs (B complex vitamins) .Marland Kitchen.. 1 once daily 11)  Vesicare 5 Mg Tabs (Solifenacin succinate) .... One by mouth daily 12)  Vitamin D 1000 Unit Tabs (Cholecalciferol) .... 2 once daily 13)  Boniva 150 Mg Tabs (Ibandronate sodium) .... One by mouth each monht on the same calendar day 30 min before eating in the am  Other Orders: Admin 1st Vaccine (28413) Flu Vaccine 43yrs + (24401)  Hypertension Assessment/Plan:      The patient's hypertensive risk group is category B: At least one risk factor (excluding diabetes) with no target organ damage.  Her calculated 10 year risk of coronary heart disease is 11 %.   Today's blood pressure is 140/80.  Her blood pressure goal is < 140/90.  Patient Instructions: 1)  Please schedule a follow-up appointment in 3 months.  Appended Document: Orders Update    Clinical Lists Changes  Orders: Added new Service order of Specimen Handling (02725) - Signed

## 2010-03-21 NOTE — Assessment & Plan Note (Signed)
Summary: 3 mnth rov//slm   Vital Signs:  Patient profile:   55 year old female Height:      59 inches Weight:      290 pounds BMI:     58.78 Temp:     98.2 degrees F oral Pulse rate:   76 / minute Resp:     14 per minute BP sitting:   138 / 80  (left arm) Cuff size:   large  Vitals Entered By: Willy Eddy, LPN (March 06, 2009 11:08 AM) CC: roa- bone density, Hypertension Management   CC:  roa- bone density and Hypertension Management.  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) bone density reviewed takes calcium with d but not extra D her asthma has been stable biut she has increased restrictive lung dz from the obesity  Asthma History    Initial Asthma Severity Rating:    Age range: 12+ years    Symptoms: 0-2 days/week    Nighttime Awakenings: 0-2/month    Interferes w/ normal activity: minor limitations    Exacerbations requiring oral systemic steroids: 0-1/year    Asthma Severity Assessment: Mild Persistent  Hypertension History:      She denies headache, chest pain, palpitations, dyspnea with exertion, orthopnea, PND, peripheral edema, visual symptoms, neurologic problems, syncope, and side effects from treatment.        Positive major cardiovascular risk factors include female age 11 years old or older and hypertension.  Negative major cardiovascular risk factors include non-tobacco-user status.      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 Once Daily 2)  Calcium-Vitamin D 600-200 Mg-Unit  Tabs (Calcium-Vitamin D) .... Once Daily 3)  Singulair 10 Mg  Tabs (Montelukast Sodium) .... Take 1 Tablet By Mouth Once A Day 4)  Advair Diskus 250-50 Mcg/dose  Misc (Fluticasone-Salmeterol) .... Two Times A Day 5)  Propoxyphene N-Apap 100-650 Mg  Tabs (Propoxyphene N-Apap) .... Take 1 Tablet By Mouth Once A Day  and As Needed 6)  Furosemide 40 Mg  Tabs (Furosemide) .... Once Daily 7)  Multivitamins   Caps (Multiple Vitamin) .... Once Daily 8)  Proair Hfa 108 (90 Base) Mcg/act  Aers (Albuterol Sulfate) .... As Needed  Current Medications (verified): 1)  Enalapril-Hydrochlorothiazide 5-12.5 Mg Tabs (Enalapril-Hydrochlorothiazide) .... Once Daily 2)  Calcium-Vitamin D 600-200 Mg-Unit  Tabs (Calcium-Vitamin D) .... Once Daily 3)  Singulair 10 Mg  Tabs (Montelukast Sodium) .... Take 1 Tablet By Mouth Once A Day 4)  Advair Diskus 250-50 Mcg/dose  Misc (Fluticasone-Salmeterol) .... Two Times A Day 5)  Furosemide 40 Mg  Tabs (Furosemide) .... Once Daily  6)  Multivitamins   Caps (Multiple Vitamin) .... Once Daily 7)  Proair Hfa 108 (90 Base) Mcg/act  Aers (Albuterol Sulfate) .... As Needed 8)  Ergocalciferol 50000 Unit Caps (Ergocalciferol) .... One By Mouth Weekly 9)  Tramadol Hcl 50 Mg Tabs (Tramadol Hcl) .... One By Mouth Three Times A Day As Needed Pain  Allergies (verified): 1)  ! Advil (Ibuprofen) 2)  ! Aspirin 3)  ! Hyzaar (Losartan Potassium-Hctz) 4)  ! Strawberry Flavor (Flavoring Agent) 5)  ! * Golytely  Past  History:  Family History: Last updated: 10/27/2006 mother Family History Hypertension obesity Family History of Stroke F 1st degree relative  father died from CAD in 45's  Social History: Last updated: 10/27/2006 Married Never Smoked Alcohol use-no Drug use-no Regular exercise-no  Risk Factors: Exercise: no (10/27/2006)  Risk Factors: Smoking Status: never (03/06/2009)  Past medical, surgical, family and social histories (including risk factors) reviewed, and no changes noted (except as noted below).  Past Medical History: Reviewed history from 12/05/2008 and no changes required. Asthma obesity psorisis venous stasis dematitis GERD Hypertension Diverticulitis, hx of Diverticulosis, colon stress fracture of the foot  Past Surgical History: Reviewed history from 10/27/2006 and no changes required. Appendectomy Cholecystectomy Hysterectomy Tonsillectomy arthroscopic surgery to knee  Family History: Reviewed history from 10/27/2006 and no changes required. mother Family History Hypertension obesity Family History of Stroke F 1st degree relative  father died from CAD in 26's  Social History: Reviewed history from 10/27/2006 and no changes required. Married Never Smoked Alcohol use-no Drug use-no Regular exercise-no  Review of Systems       The patient complains of weight gain, hoarseness, dyspnea on exertion, and peripheral edema.  The patient denies anorexia, fever, weight loss, vision loss, decreased hearing, chest pain, syncope, prolonged cough, headaches, hemoptysis, abdominal pain, melena, hematochezia, severe indigestion/heartburn, hematuria, incontinence, genital sores, muscle weakness, suspicious skin lesions, transient blindness, difficulty walking, depression, unusual weight change, abnormal bleeding, enlarged lymph nodes, angioedema, and breast masses.    Physical Exam  General:  alert and overweight-appearing.   Head:  Normocephalic and  atraumatic without obvious abnormalities. No apparent alopecia or balding. Ears:  R ear normal and L ear normal.   Nose:  mucosal erythema and mucosal edema.   Neck:  No deformities, masses, or tenderness noted. Lungs:    end exp wheezeR wheezes and L wheezes.  distant breath sound bilaterally  Heart:  normal rate and regular rhythm.   Abdomen:  soft, no rigidity, and distended.   Msk:  no joint tenderness, no joint swelling, and no joint warmth.   Extremities:  1+ left pedal edema and 1+ right pedal edema.   Skin:  stasis dermatitis Psych:  Oriented X3 and not anxious appearing.     Impression & Recommendations:  Problem # 1:  OSTEOPENIA (ICD-733.90) measure vitamin d and found to be low Discussed medication use, applications of heat or ice, and exercises.  add ergocalciferol  Problem # 2:  HYPERTENSION (ICD-401.9) fair control Her updated medication list for this problem includes:    Enalapril-hydrochlorothiazide 5-12.5 Mg Tabs (Enalapril-hydrochlorothiazide) ..... Once daily    Furosemide 40 Mg Tabs (Furosemide) ..... Once daily  BP today: 138/80 Prior BP: 144/80 (12/05/2008)  10 Yr Risk Heart Disease: 8 % Prior 10 Yr Risk Heart Disease: 7 % (02/29/2008)  Labs Reviewed: K+: 3.5 (11/28/2008) Creat: : 0.6 (11/28/2008)   Chol: 195 (11/28/2008)   HDL: 51.60 (11/28/2008)   LDL: 120 (11/28/2008)   TG: 115.0 (11/28/2008)  Problem # 3:  STRESS FRACTURE, FOOT (ICD-733.94) risk is obesisty weight loss and dereaqsed weigth bearing of the joint, ortho consult persistant pain  Discussed medication use, applications of heat or ice, and exercises.   Problem # 4:  CHRONIC OBSTRUCTIVE ASTHMA WITH EXACERBATION (ICD-493.22) stable Her updated medication list for this problem includes:    Singulair 10 Mg Tabs (Montelukast sodium) .Marland Kitchen... Take 1 tablet by mouth once a day    Advair Diskus 250-50 Mcg/dose Misc (Fluticasone-salmeterol) .Marland Kitchen..Marland Kitchen Two times a day    Proair Hfa 108 (90 Base)  Mcg/act Aers (Albuterol sulfate) .Marland Kitchen... As needed  Problem # 5:  STASIS DERMATITIS, CHRONIC (ICD-459.81) weigth loss elevate legs refused copression stockings  Complete Medication List: 1)  Enalapril-hydrochlorothiazide 5-12.5 Mg Tabs (Enalapril-hydrochlorothiazide) .... Once daily 2)  Calcium-vitamin D 600-200 Mg-unit Tabs (Calcium-vitamin d) .... Once daily 3)  Singulair 10 Mg Tabs (Montelukast sodium) .... Take 1 tablet by mouth once a day 4)  Advair Diskus 250-50 Mcg/dose Misc (Fluticasone-salmeterol) .... Two times a day 5)  Furosemide 40 Mg Tabs (Furosemide) .... Once daily 6)  Multivitamins Caps (Multiple vitamin) .... Once daily 7)  Proair Hfa 108 (90 Base) Mcg/act Aers (Albuterol sulfate) .... As needed 8)  Ergocalciferol 50000 Unit Caps (Ergocalciferol) .... One by mouth weekly 9)  Tramadol Hcl 50 Mg Tabs (Tramadol hcl) .... One by mouth three times a day as needed pain  Hypertension Assessment/Plan:      The patient's hypertensive risk group is category B: At least one risk factor (excluding diabetes) with no target organ damage.  Her calculated 10 year risk of coronary heart disease is 8 %.  Today's blood pressure is 138/80.  Her blood pressure goal is < 140/90.  Patient Instructions: 1)  Please schedule a follow-up appointment in 2 months. 2)  BMP prior to visit, ICD-9: 401.90 3)  vit d level 733.00 Prescriptions: ERGOCALCIFEROL 50000 UNIT CAPS (ERGOCALCIFEROL) one by mouth weekly  #5 x 11   Entered by:   Willy Eddy, LPN   Authorized by:   Stacie Glaze MD   Signed by:   Willy Eddy, LPN on 16/11/9602   Method used:   Electronically to        [facility] [phone]* (retail)       [address]       [place], [place]  [address]       Ph: [phone]       Fax: [phone]   RxID:   [id number] FUROSEMIDE 40 MG  TABS (FUROSEMIDE) once daily  #90 x 3   Entered by:   Willy Eddy, LPN   Authorized by:   Stacie Glaze MD   Signed by:   Willy Eddy, LPN on 25/36/6440   Method used:   Electronically to        [facility] [phone]* (retail)       [address]       [place], [place]  [address]       Ph: [phone]       Fax: [phone]   RxID:   [id number] SINGULAIR 10 MG  TABS (MONTELUKAST SODIUM) Take 1 tablet by mouth once a day  #30 x 6   Entered by:   Willy Eddy, LPN   Authorized by:   Stacie Glaze MD   Signed by:   Willy Eddy, LPN on 20/25/4270   Method used:   Electronically to  [facility] [address] [phone]* (retail)       [address]       [place], [place]  [address]       Ph: [phone]       Fax: [phone]   RxID:   [id number] ENALAPRIL-HYDROCHLOROTHIAZIDE 5-12.5 MG TABS (ENALAPRIL-HYDROCHLOROTHIAZIDE) once daily  #90 x 3   Entered by:   Willy Eddy, LPN   Authorized by:   Stacie Glaze MD   Signed by:   Willy Eddy, LPN on 03/47/4259   Method used:   Electronically to        [facility] [phone]* (retail)       [address]       [place], [place]  [address]       Ph: [phone]       Fax: [phone]   RxID:   [id number] ERGOCALCIFEROL 50000 UNIT CAPS (ERGOCALCIFEROL) one by mouth weekly  #5 x 11   Entered and Authorized by:   Stacie Glaze MD   Signed by:   Stacie Glaze MD on 03/06/2009   Method used:   Electronically to        [facility] [phone]* (retail)       [address]       [place], [place]  [address]       Ph: [phone]       Fax: [phone]   RxID:   [id number] FUROSEMIDE 40 MG  TABS (FUROSEMIDE) once daily  #90 x 3   Entered and Authorized by:   Stacie Glaze MD   Signed by:   Stacie Glaze MD on 03/06/2009   Method used:   Electronically to        [facility] [phone]* (retail)       [address]       [place], [place]  [address]       Ph: [phone]       Fax: [phone]   RxID:   [id number] ENALAPRIL-HYDROCHLOROTHIAZIDE 5-12.5 MG TABS (ENALAPRIL-HYDROCHLOROTHIAZIDE) once  daily  #90 x 3   Entered and Authorized by:   Stacie Glaze MD   Signed by:   Stacie Glaze MD on 03/06/2009   Method used:   Electronically to        [facility] [phone]* (retail)       [address]       [place], [place]  [address]       Ph: [phone]       Fax: [phone]   RxID:   [id number] SINGULAIR 10 MG  TABS (MONTELUKAST SODIUM) Take 1 tablet by mouth once a day  #30 x 6   Entered and Authorized by:   Stacie Glaze MD   Signed by:   Stacie Glaze MD on 03/06/2009   Method used:   Electronically to        [facility] [phone]* (retail)       [address]       [place], [place]  [address]       Ph: [phone]       Fax: [phone]   RxID:   [id number] TRAMADOL HCL 50 MG TABS (TRAMADOL HCL) one by mouth three times a day as needed pain  #90 x 3   Entered and Authorized by:   Stacie Glaze MD   Signed by:   Stacie Glaze MD on 03/06/2009   Method  used:   Electronically to        [facility] [phone]* (retail)       [address]       [place], [place]  [address]       Ph: [phone]       Fax: [phone]   RxID:   [id number] FUROSEMIDE 40 MG  TABS (FUROSEMIDE) once daily  #90 x 3   Entered and Authorized by:   Stacie Glaze MD   Signed by:   Stacie Glaze MD on 03/06/2009   Method used:   Electronically to        [facility] [address] [phone]* (retail)       [address]       [place], [place]  [address]       Ph: [phone] or [phone]       Fax: [phone]   RxID:   [id number] ENALAPRIL-HYDROCHLOROTHIAZIDE 5-12.5 MG TABS (ENALAPRIL-HYDROCHLOROTHIAZIDE) once daily  #90 x 3   Entered and Authorized by:   Stacie Glaze MD   Signed by:   Stacie Glaze MD on 03/06/2009   Method used:   Electronically to        [facility] [address] [phone]* (retail)       [address]       [place], [place]  [address]       Ph: [phone] or [phone]       Fax:  [phone]   RxID:   [id number] SINGULAIR 10 MG  TABS (MONTELUKAST SODIUM) Take 1 tablet by mouth once a day  #30 x 6   Entered and Authorized by:   Stacie Glaze MD   Signed by:   Stacie Glaze MD on 03/06/2009   Method used:   [facility]        [facility] [address] [phone]* (retail)       [address]       [place], [place]  [address]       Ph: [phone] or [phone]       Fax: [phone]   RxID:   [id number] ERGOCALCIFEROL 50000 UNIT CAPS (ERGOCALCIFEROL) one by mouth weekly  #5 x 11   Entered and Authorized by:   Stacie Glaze MD   Signed by:   Stacie Glaze MD on 03/06/2009   Method used:   [facility]ron[facility]        [facility] [address] [phone]* (retail)       [address]       [place], [place]  [address]       Ph: [phone] or [phone]       Fax: [phone]   RxID:   [id number]

## 2010-03-21 NOTE — Assessment & Plan Note (Signed)
Summary: 2 month rov/njr   Vital Signs:  Patient profile:   55 year old female Height:      59 inches Weight:      288 pounds BMI:     58.38 Temp:     98.2 degrees F oral Pulse rate:   76 / minute Resp:     14 per minute BP sitting:   150 / 80  (left arm) Cuff size:   large  Vitals Entered By: Willy Eddy, LPN (May 01, 2009 12:02 PM) CC: roa- labs- has been taking vitamind 50,900 weekly since january    CC:  roa- labs- has been taking vitamind 50 and 900 weekly since january.  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) 13)  Psoriasis  (ICD-696.1) 14)  Obesity, Morbid  (ICD-278.01) 15)  Gerd  (ICD-530.81) 16)  Asthma  (ICD-493.90)  Current Problems (verified): 1)  Osteopenia  (ICD-733.90) 2)  Stress Fracture, Foot  (ICD-733.94) 3)  Foot Pain, Bilateral  (ICD-729.5) 4)  Diverticulosis, Colon  (ICD-562.10) 5)  Diverticulitis, Hx of  (ICD-V12.79) 6)  Chronic Obstructive Asthma With Exacerbation  (ICD-493.22) 7)  Cough  (ICD-786.2) 8)  Preventive Health Care  (ICD-V70.0) 9)  Stasis Dermatitis, Chronic  (ICD-459.81) 10)  Hypertension  (ICD-401.9) 11)  Family History of Cervical Cancer  (ICD-V17.3) 12)  Insufficiency, 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 Once Daily 2)  Calcium-Vitamin D 600-200 Mg-Unit  Tabs (Calcium-Vitamin D) .... Once Daily 3)  Singulair 10 Mg  Tabs (Montelukast Sodium) .... Take 1 Tablet By Mouth Once A Day 4)  Advair Diskus 250-50 Mcg/dose  Misc (Fluticasone-Salmeterol) .... Two Times A Day 5)  Furosemide 40 Mg  Tabs (Furosemide) .... Once Daily 6)  Multivitamins   Caps (Multiple Vitamin) .... Once Daily 7)  Proair Hfa 108 (90 Base) Mcg/act  Aers (Albuterol Sulfate) .... As Needed 8)  Ergocalciferol 50000 Unit Caps (Ergocalciferol) .... One By Mouth Weekly 9)  Tramadol Hcl 50 Mg Tabs (Tramadol Hcl) .... One By Mouth Three Times A Day As Needed Pain  Current Medications (verified): 1)  Enalapril-Hydrochlorothiazide 5-12.5 Mg Tabs (Enalapril-Hydrochlorothiazide) .... Once Daily 2)  Calcium-Vitamin D 600-200 Mg-Unit  Tabs (Calcium-Vitamin D) .... Once Daily 3)  Singulair 10 Mg  Tabs (Montelukast Sodium) .... Take 1 Tablet By Mouth Once A Day 4)  Advair Diskus 250-50 Mcg/dose  Misc (Fluticasone-Salmeterol) .... Two Times A Day 5)  Furosemide 40 Mg  Tabs (Furosemide) .... Once Daily 6)  Multivitamins   Caps (Multiple Vitamin) .... Once Daily 7)  Proair Hfa 108 (90 Base) Mcg/act  Aers (Albuterol Sulfate) .... As Needed 8)  Ergocalciferol 50000 Unit Caps (Ergocalciferol) .... One By Mouth Twice A Week On Monday and Thursday 9)  Tramadol Hcl 50 Mg Tabs (Tramadol Hcl) .Marland KitchenMarland KitchenMarland Kitchen  One By Mouth Three Times A Day As Needed Pain  Allergies (verified): 1)  ! Advil (Ibuprofen) 2)  ! Aspirin 3)  ! Hyzaar (Losartan Potassium-Hctz) 4)  ! Strawberry Flavor (Flavoring Agent) 5)  ! * Golytely  Past History:  Family History: Last updated: 10/27/2006 mother Family History Hypertension obesity Family History of Stroke F 1st degree relative  father died from CAD in 69's  Social History: Last updated: 10/27/2006 Married Never Smoked Alcohol use-no Drug  use-no Regular exercise-no  Risk Factors: Exercise: no (10/27/2006)  Risk Factors: Smoking Status: never (05/01/2009)  Past medical, surgical, family and social histories (including risk factors) reviewed, and no changes noted (except as noted below).  Past Medical History: Reviewed history from 12/05/2008 and no changes required. Asthma obesity psorisis venous stasis dematitis GERD Hypertension Diverticulitis, hx of Diverticulosis, colon stress fracture of the foot  Past Surgical History: Reviewed history from 10/27/2006 and no changes required. Appendectomy Cholecystectomy Hysterectomy Tonsillectomy arthroscopic surgery to knee  Family History: Reviewed history from 10/27/2006 and no changes required. mother Family History Hypertension obesity Family History of Stroke F 1st degree relative  father died from CAD in 38's  Social History: Reviewed history from 10/27/2006 and no changes required. Married Never Smoked Alcohol use-no Drug use-no Regular exercise-no  Review of Systems       The patient complains of peripheral edema.  The patient denies anorexia, fever, weight loss, weight gain, vision loss, decreased hearing, hoarseness, chest pain, syncope, dyspnea on exertion, prolonged cough, headaches, hemoptysis, abdominal pain, melena, hematochezia, severe indigestion/heartburn, hematuria, incontinence, genital sores, muscle weakness, suspicious skin lesions, transient blindness, difficulty walking, depression, unusual weight change, abnormal bleeding, enlarged lymph nodes, angioedema, and breast masses.    Physical Exam  General:  alert and overweight-appearing.   Head:  normocephalic and atraumatic.   Eyes:  pupils equal and pupils round.   Ears:  R ear normal and L ear normal.   Neck:  No deformities, masses, or tenderness noted. Lungs:  normal respiratory effort and no wheezes.   Heart:  normal rate and regular rhythm.   Abdomen:  soft, no rigidity,  and distended.   Msk:  no joint tenderness, no joint swelling, and no joint warmth.   Extremities:  right third toe reveals minimal erythema distally. She has one very small pimple-like lesion which appears to be drying up. No fluctuance. No tenderness. No erythema involving the foot Neurologic:  alert & oriented X3, cranial nerves II-XII intact, and abnormal gait.     Impression & Recommendations:  Problem # 1:  OSTEOPENIA (ICD-733.90) Assessment Deteriorated  Discussed medication use, applications of heat or ice, and exercises.   Problem # 2:  STRESS FRACTURE, FOOT (ICD-733.94) Assessment: Unchanged due to osteopenia and vit  d def? also urging calcium use and weight loss and part of the therapy plan.  Problem # 3:  CHRONIC OBSTRUCTIVE ASTHMA WITH EXACERBATION (ZOX-096.04) Assessment: Unchanged  Her updated medication list for this problem includes:    Singulair 10 Mg Tabs (Montelukast sodium) .Marland Kitchen... Take 1 tablet by mouth once a day    Advair Diskus 250-50 Mcg/dose Misc (Fluticasone-salmeterol) .Marland Kitchen..Marland Kitchen Two times a day    Proair Hfa 108 (90 Base) Mcg/act Aers (Albuterol sulfate) .Marland Kitchen... As needed  Problem # 4:  HYPERTENSION (ICD-401.9) Assessment: Unchanged increased stress Her updated medication list for this problem includes:    Enalapril-hydrochlorothiazide 5-12.5 Mg Tabs (Enalapril-hydrochlorothiazide) ..... Once daily    Furosemide 40 Mg Tabs (Furosemide) ..... Once daily  BP today:  150/80 Prior BP: 120/78 (04/10/2009)  Prior 10 Yr Risk Heart Disease: 8 % (03/06/2009)  Labs Reviewed: K+: 3.6 (04/24/2009) Creat: : 0.7 (04/24/2009)   Chol: 195 (11/28/2008)   HDL: 51.60 (11/28/2008)   LDL: 120 (11/28/2008)   TG: 115.0 (11/28/2008)  Complete Medication List: 1)  Enalapril-hydrochlorothiazide 5-12.5 Mg Tabs (Enalapril-hydrochlorothiazide) .... Once daily 2)  Calcium-vitamin D 600-200 Mg-unit Tabs (Calcium-vitamin d) .... Once daily 3)  Singulair 10 Mg Tabs (Montelukast  sodium) .... Take 1 tablet by mouth once a day 4)  Advair Diskus 250-50 Mcg/dose Misc (Fluticasone-salmeterol) .... Two times a day 5)  Furosemide 40 Mg Tabs (Furosemide) .... Once daily 6)  Multivitamins Caps (Multiple vitamin) .... Once daily 7)  Proair Hfa 108 (90 Base) Mcg/act Aers (Albuterol sulfate) .... As needed 8)  Ergocalciferol 50000 Unit Caps (Ergocalciferol) .... One by mouth twice a week on monday and thursday 9)  Tramadol Hcl 50 Mg Tabs (Tramadol hcl) .... One by mouth three times a day as needed pain  Patient Instructions: 1)  Please schedule a follow-up appointment in 2 months. Prescriptions: ERGOCALCIFEROL 50000 UNIT CAPS (ERGOCALCIFEROL) one by mouth twice a week on monday and thursday  #10 x 11   Entered and Authorized by:   Stacie Glaze MD   Signed by:   Stacie Glaze MD on 05/01/2009   Method used:   Electronically to        [facility] [phone]* (retail)       [address]       [place], [place]  [address]       Ph: [phone]       Fax: [phone]   RxID:   [id number]

## 2010-03-21 NOTE — Assessment & Plan Note (Signed)
Summary: 2 month rov/njr   Vital Signs:  Patient profile:   55 year old female Height:      59 inches Weight:      282 pounds BMI:     57.16 Temp:     98.2 degrees F oral Pulse rate:   76 / minute Resp:     14 per minute BP sitting:   140 / 80  (left arm) Cuff size:   large  Vitals Entered By: Willy Eddy, LPN (Jun 26, 2009 9:59 AM) CC: roa, Hypertension Management   CC:  roa and Hypertension Management.  History of Present Illness: follow up for obesity  and HTN weight is is down 5 pounds!  Asthma History    Asthma Control Assessment:    Age range: 12+ years    Symptoms: 0-2 days/week    Nighttime Awakenings: 1-3/week    Interferes w/ normal activity: no limitations    SABA use (not for EIB): 0-2 days/week    Asthma Control Assessment: Not Well Controlled  Hypertension History:      She denies headache, chest pain, palpitations, dyspnea with exertion, orthopnea, PND, peripheral edema, visual symptoms, neurologic problems, syncope, and side effects from treatment.        Positive major cardiovascular risk factors include female age 58 years old or older and hypertension.  Negative major cardiovascular risk factors include non-tobacco-user status.       Preventive Screening-Counseling & Management  Alcohol-Tobacco     Smoking Status: never  Current Problems (verified): 1)  Osteopenia  (ICD-733.90) 2)  Stress Fracture, Foot  (ICD-733.94) 3)  Foot Pain, Bilateral  (ICD-729.5) 4)  Diverticulosis, Colon  (ICD-562.10) 5)  Diverticulitis, Hx of  (ICD-V12.79) 6)  Chronic Obstructive Asthma With Exacerbation  (ICD-493.22) 7)  Cough  (ICD-786.2) 8)  Preventive Health Care  (ICD-V70.0) 9)  Stasis Dermatitis, Chronic  (ICD-459.81) 10)  Hypertension  (ICD-401.9) 11)  Family History of Cervical Cancer  (ICD-V17.3) 12)  Insufficiency, Venous Nos  (ICD-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 Once Daily 2)  Calcium-Vitamin D 600-200 Mg-Unit  Tabs (Calcium-Vitamin D) .... Once Daily 3)  Singulair 10 Mg  Tabs (Montelukast Sodium) .... Take 1 Tablet By Mouth Once A Day 4)  Advair Diskus 250-50 Mcg/dose  Misc (Fluticasone-Salmeterol) .... Two Times A Day 5)  Furosemide 40 Mg  Tabs (Furosemide) .... Once Daily 6)  Multivitamins   Caps (Multiple Vitamin) .... Once Daily 7)  Proair Hfa 108 (90 Base) Mcg/act  Aers (Albuterol Sulfate) .... As Needed 8)  Ergocalciferol 50000 Unit Caps (Ergocalciferol) .... One By Mouth Twice A Week On Monday and Thursday 9)  Tramadol Hcl 50 Mg Tabs (Tramadol Hcl) .... One By Mouth Three Times A Day As Needed Pain 10)  Alprazolam 0.25 Mg Tabs (Alprazolam) .... One Every 4-6 Hours As Needed Anxiety 11)  B Complex-B12  Tabs (B Complex Vitamins) .Marland Kitchen.. 1 Once Daily  Allergies (verified): 1)  ! Advil (Ibuprofen) 2)  ! Aspirin 3)  ! Hyzaar (Losartan Potassium-Hctz) 4)  ! Strawberry Flavor (Flavoring Agent) 5)  ! * Golytely   Impression & Recommendations:  Problem # 1:  CHRONIC OBSTRUCTIVE ASTHMA WITH EXACERBATION (ICD-493.22) fair control with monitmal flairs the key to therapy is weight reduction Her updated medication list for this problem includes:    Singulair 10 Mg Tabs (  Montelukast sodium) .Marland Kitchen... Take 1 tablet by mouth once a day    Advair Diskus 250-50 Mcg/dose Misc (Fluticasone-salmeterol) .Marland Kitchen..Marland Kitchen Two times a day    Proair Hfa 108 (90 Base) Mcg/act Aers (Albuterol sulfate) .Marland Kitchen... As needed  Current Asthma Management Plan:  Green Zone:  ADVAIR DISKUS 250-50 MCG/DOSE  MISC SINGULAIR 10 MG  TABS Yellow Zone:  PROAIR HFA 108 (90 BASE) MCG/ACT  AERS:  2 puffs every 3-4 hours (yellow zone)  Problem # 2:  STRESS FRACTURE, FOOT (ICD-733.94)  healed but still has some pain  Discussed medication use, applications of  heat or ice, and exercises.   Problem # 3:  STASIS DERMATITIS, CHRONIC (ICD-459.81)  the obesity results in edema ans venous stasis changes no ulcers see  Orders: Durable Medical Equipment (DME) TLB-CBC Platelet - w/Differential (85025-CBCD)  Problem # 4:  HYPERTENSION (ICD-401.9)  Her updated medication list for this problem includes:    Enalapril-hydrochlorothiazide 5-12.5 Mg Tabs (Enalapril-hydrochlorothiazide) ..... Once daily    Furosemide 40 Mg Tabs (Furosemide) ..... Once daily  BP today: 140/80 Prior BP: 150/80 (05/01/2009)  10 Yr Risk Heart Disease: 11 % Prior 10 Yr Risk Heart Disease: 8 % (03/06/2009)  Labs Reviewed: K+: 3.6 (04/24/2009) Creat: : 0.7 (04/24/2009)   Chol: 195 (11/28/2008)   HDL: 51.60 (11/28/2008)   LDL: 120 (11/28/2008)   TG: 115.0 (11/28/2008)  Orders: TLB-BMP (Basic Metabolic Panel-BMET) (80048-METABOL)  Problem # 5:  OSTEOPENIA (ICD-733.90) on 50,000 international units twice a week Orders: T-Vitamin D (25-Hydroxy) (16109-60454)  Discussed medication use, applications of heat or ice, and exercises.   Problem # 6:  PLANTAR WART, RIGHT (ICD-078.12) Assessment: Unchanged the lesion was identifies as a   plantar wart on the right great toe       and 40 seconds of cryotherapy with the liguid nitrogen gun was apllied to the site. The pt tolerated the procedure and post procedure care was discussed  Orders: Cryotherapy/Destruction benign or premalignant lesion (1st lesion)  (17000)  Complete Medication List: 1)  Enalapril-hydrochlorothiazide 5-12.5 Mg Tabs (Enalapril-hydrochlorothiazide) .... Once daily 2)  Calcium-vitamin D 600-200 Mg-unit Tabs (Calcium-vitamin d) .... Once daily 3)  Singulair 10 Mg Tabs (Montelukast sodium) .... Take 1 tablet by mouth once a day 4)  Advair Diskus 250-50 Mcg/dose Misc (Fluticasone-salmeterol) .... Two times a day 5)  Furosemide 40 Mg Tabs (Furosemide) .... Once daily 6)  Multivitamins Caps (Multiple vitamin)  .... Once daily 7)  Proair Hfa 108 (90 Base) Mcg/act Aers (Albuterol sulfate) .... As needed 8)  Ergocalciferol 50000 Unit Caps (Ergocalciferol) .... One by mouth twice a week on monday and thursday 9)  Tramadol Hcl 50 Mg Tabs (Tramadol hcl) .... One by mouth three times a day as needed pain 10)  Alprazolam 0.25 Mg Tabs (Alprazolam) .... One every 4-6 hours as needed anxiety 11)  B Complex-b12 Tabs (B complex vitamins) .Marland Kitchen.. 1 once daily  Asthma Management Plan    Asthma Severity: Mild Persistent    Control Assessment: Not Well Controlled    Plan based on PEF formula: [place] and Dinah Beers Zone:ADVAIR DISKUS 250-50 MCG/DOSE  MISC SINGULAIR 10 MG  TABS  Yellow Zone: PROAIR HFA 108 (90 BASE) MCG/ACT  AERS:  2 puffs every 3-4 hours (yellow zone)  Hypertension Assessment/Plan:      The patient's hypertensive risk group is category B: At least one risk factor (excluding diabetes) with no target organ damage.  Her calculated 10 year risk of coronary heart disease is 11 %.  Today's blood pressure is 140/80.  Her blood pressure goal is < 140/90.  Patient Instructions: 1)  Please schedule a follow-up appointment in 3 months.

## 2010-03-21 NOTE — Assessment & Plan Note (Signed)
Summary: 3 month fup//ccm   Vital Signs:  Patient profile:   55 year old female Height:      59 inches Weight:      290 pounds BMI:     58.78 Temp:     98.2 degrees F oral Pulse rate:   80 / minute Resp:     16 per minute BP sitting:   140 / 80  (left arm) Cuff size:   large  Vitals Entered By: Willy Eddy, LPN (September 24, 2009 9:13 AM) CC: roa, Hypertension Management Is Patient Diabetic? No   Primary Care Provider:  Stacie Glaze MD  CC:  roa and Hypertension Management.  History of Present Illness: has gained  weight not walking but has been working  long hours works at [employer] and she has a problem with the fried foods and the carbohydrates has noted wheezing with the heat has not had to use the rescue inhailer has episodes of stress incontinance  Hypertension History:      She denies headache, chest pain, palpitations, dyspnea with exertion, orthopnea, PND, peripheral edema, visual symptoms, neurologic problems, syncope, and side effects from treatment.        Positive major cardiovascular risk factors include female age 36 years old or older and hypertension.  Negative major cardiovascular risk factors include non-tobacco-user status.     Preventive Screening-Counseling & Management  Alcohol-Tobacco     Smoking Status: never  Problems Prior to Update: 1)  Plantar Wart, Right  (ICD-078.12) 2)  Osteopenia  (ICD-733.90) 3)  Stress Fracture, Foot  (ICD-733.94) 4)  Foot Pain, Bilateral  (ICD-729.5) 5)  Diverticulosis, Colon  (ICD-562.10) 6)  Diverticulitis, Hx of  (ICD-V12.79) 7)  Chronic Obstructive Asthma With Exacerbation  (ICD-493.22) 8)  Cough  (ICD-786.2) 9)  Preventive Health Care  (ICD-V70.0) 10)  Stasis Dermatitis, Chronic  (ICD-459.81) 11)  Hypertension  (ICD-401.9) 12)  Family History of Cervical Cancer  (ICD-V17.3) 13)  Insufficiency, Venous Nos  (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) 14)  Psoriasis  (ICD-696.1) 15)  Obesity, Morbid  (ICD-278.01) 16)  Gerd   (ICD-530.81) 17)  Asthma  (ICD-493.90)  Current Problems (verified): 1)  Plantar Wart, Right  (ICD-078.12) 2)  Osteopenia  (ICD-733.90) 3)  Stress Fracture, Foot  (ICD-733.94) 4)  Foot Pain, Bilateral  (ICD-729.5) 5)  Diverticulosis, Colon  (ICD-562.10) 6)  Diverticulitis, Hx of  (ICD-V12.79) 7)  Chronic Obstructive Asthma With Exacerbation  (ICD-493.22) 8)  Cough  (ICD-786.2) 9)  Preventive Health Care  (ICD-V70.0) 10)  Stasis Dermatitis, Chronic  (ICD-459.81) 11)  Hypertension  (ICD-401.9) 12)  Family History of Cervical Cancer  (ICD-V17.3) 13)  Insufficiency, 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 Once Daily 2)  Calcium-Vitamin D 600-200 Mg-Unit  Tabs (Calcium-Vitamin D) .... Once Daily 3)  Singulair 10 Mg  Tabs (Montelukast Sodium) .... Take 1 Tablet By Mouth Once A Day 4)  Advair Diskus 250-50 Mcg/dose  Misc (Fluticasone-Salmeterol) .... Two Times A Day 5)  Furosemide 40 Mg  Tabs (Furosemide) .... Once Daily 6)  Multivitamins   Caps (Multiple Vitamin) .... Once Daily 7)  Proair Hfa 108 (90 Base) Mcg/act  Aers (Albuterol Sulfate) .... As Needed 8)  Ergocalciferol 50000 Unit Caps (Ergocalciferol) .... One By Mouth Twice A Week On Monday and Thursday 9)  Tramadol Hcl 50 Mg Tabs (Tramadol Hcl) .Marland KitchenMarland KitchenMarland Kitchen  One By Mouth Three Times A Day As Needed Pain 10)  Alprazolam 0.25 Mg Tabs (Alprazolam) .... One Every 4-6 Hours As Needed Anxiety 11)  B Complex-B12  Tabs (B Complex Vitamins) .Marland Kitchen.. 1 Once Daily  Current Medications (verified): 1)  Enalapril-Hydrochlorothiazide 5-12.5 Mg Tabs (Enalapril-Hydrochlorothiazide) .... Once Daily 2)  Calcium-Vitamin D 600-200 Mg-Unit  Tabs (Calcium-Vitamin D) .... Once Daily 3)  Singulair 10 Mg  Tabs (Montelukast Sodium) .... Take 1 Tablet By  Mouth Once A Day 4)  Advair Diskus 250-50 Mcg/dose  Misc (Fluticasone-Salmeterol) .... Two Times A Day 5)  Furosemide 40 Mg  Tabs (Furosemide) .... Once Daily 6)  Multivitamins   Caps (Multiple Vitamin) .... Once Daily 7)  Proair Hfa 108 (90 Base) Mcg/act  Aers (Albuterol Sulfate) .... As Needed 8)  Ergocalciferol 50000 Unit Caps (Ergocalciferol) .... One By Mouth Twice A Week On Monday and Thursday 9)  Tramadol Hcl 50 Mg Tabs (Tramadol Hcl) .... One By Mouth Three Times A Day As Needed Pain 10)  B Complex-B12  Tabs (B Complex Vitamins) .Marland Kitchen.. 1 Once Daily  Allergies (verified): 1)  ! Advil (Ibuprofen) 2)  ! Aspirin 3)  ! Hyzaar (Losartan Potassium-Hctz) 4)  ! Strawberry Flavor (Flavoring Agent) 5)  ! * Golytely  Past History:  Family History: Last updated: 10/27/2006 mother Family History Hypertension obesity Family History of Stroke F 1st degree relative  father died from CAD in 29's  Social History: Last updated: 10/27/2006 Married Never Smoked Alcohol use-no Drug use-no Regular exercise-no  Risk Factors: Exercise: no (10/27/2006)  Risk Factors: Smoking Status: never (09/24/2009)  Past medical, surgical, family and social histories (including risk factors) reviewed, and no changes noted (except as noted below).  Past Medical History: Reviewed history from 12/05/2008 and no changes required. Asthma obesity psorisis venous stasis dematitis GERD Hypertension Diverticulitis, hx of Diverticulosis, colon stress fracture of the foot  Past Surgical History: Reviewed history from 10/27/2006 and no changes required. Appendectomy Cholecystectomy Hysterectomy Tonsillectomy arthroscopic surgery to knee  Family History: Reviewed history from 10/27/2006 and no changes required. mother Family History Hypertension obesity Family History of Stroke F 1st degree relative  father died from CAD in 49's  Social History: Reviewed history from 10/27/2006 and no changes  required. Married Never Smoked Alcohol use-no Drug use-no Regular exercise-no  Review of Systems       The patient complains of weight gain, dyspnea on exertion, and peripheral edema.  The patient denies anorexia, fever, weight loss, vision loss, decreased hearing, hoarseness, chest pain, syncope, prolonged cough, headaches, hemoptysis, abdominal pain, melena, hematochezia, severe indigestion/heartburn, hematuria, incontinence, genital sores, muscle weakness, suspicious skin lesions, transient blindness, difficulty walking, depression, unusual weight change, abnormal bleeding, enlarged lymph nodes, angioedema, and breast masses.    Physical Exam  General:  alert and overweight-appearing.   Head:  normocephalic and atraumatic.   Eyes:  pupils equal and pupils round.   Ears:  R ear normal and L ear normal.   Nose:  mucosal erythema and mucosal edema.   Mouth:  pharynx pink and moist and no erythema.   Neck:  No deformities, masses, or tenderness noted. Lungs:  normal respiratory effort and no wheezes.   Heart:  normal rate and regular rhythm.   Msk:  no joint swelling, no joint warmth, no redness over joints, decreased ROM, and joint tenderness.   Extremities:  1+ left pedal edema and 1+ right pedal edema.   Neurologic:  alert & oriented X3 and finger-to-nose normal.  Impression & Recommendations:  Problem # 1:  OSTEOPENIA (ICD-733.90)  Discussed medication use, applications of heat or ice, and exercises.   Orders: Venipuncture (81191) T-Vitamin D (25-Hydroxy) (47829-56213)  Problem # 2:  URINARY INCONTINENCE, STRESS, FEMALE (ICD-625.6) vesicare trial of 5 mg samples  Problem # 3:  HYPERTENSION (ICD-401.9)  Her updated medication list for this problem includes:    Enalapril-hydrochlorothiazide 5-12.5 Mg Tabs (Enalapril-hydrochlorothiazide) ..... Once daily    Furosemide 40 Mg Tabs (Furosemide) ..... Once daily  Orders: TLB-BMP (Basic Metabolic Panel-BMET)  (80048-METABOL)  BP today: 140/80 Prior BP: 140/80 (06/26/2009)  Prior 10 Yr Risk Heart Disease: 11 % (06/26/2009)  Labs Reviewed: K+: 4.1 (06/26/2009) Creat: : 0.5 (06/26/2009)   Chol: 195 (11/28/2008)   HDL: 51.60 (11/28/2008)   LDL: 120 (11/28/2008)   TG: 115.0 (11/28/2008)  Problem # 4:  CHRONIC OBSTRUCTIVE ASTHMA WITH EXACERBATION (YQM-578.46)  Her updated medication list for this problem includes:    Singulair 10 Mg Tabs (Montelukast sodium) .Marland Kitchen... Take 1 tablet by mouth once a day    Advair Diskus 250-50 Mcg/dose Misc (Fluticasone-salmeterol) .Marland Kitchen..Marland Kitchen Two times a day    Proair Hfa 108 (90 Base) Mcg/act Aers (Albuterol sulfate) .Marland Kitchen... As needed  Current Asthma Management Plan:  Green Zone:  ADVAIR DISKUS 250-50 MCG/DOSE  MISC SINGULAIR 10 MG  TABS Yellow Zone:  PROAIR HFA 108 (90 BASE) MCG/ACT  AERS:  2 puffs every 3-4 hours (yellow zone)  Complete Medication List: 1)  Enalapril-hydrochlorothiazide 5-12.5 Mg Tabs (Enalapril-hydrochlorothiazide) .... Once daily 2)  Calcium-vitamin D 600-200 Mg-unit Tabs (Calcium-vitamin d) .... Once daily 3)  Singulair 10 Mg Tabs (Montelukast sodium) .... Take 1 tablet by mouth once a day 4)  Advair Diskus 250-50 Mcg/dose Misc (Fluticasone-salmeterol) .... Two times a day 5)  Furosemide 40 Mg Tabs (Furosemide) .... Once daily 6)  Multivitamins Caps (Multiple vitamin) .... Once daily 7)  Proair Hfa 108 (90 Base) Mcg/act Aers (Albuterol sulfate) .... As needed 8)  Ergocalciferol 50000 Unit Caps (Ergocalciferol) .... One by mouth twice a week on monday and thursday 9)  Tramadol Hcl 50 Mg Tabs (Tramadol hcl) .... One by mouth three times a day as needed pain 10)  B Complex-b12 Tabs (B complex vitamins) .Marland Kitchen.. 1 once daily 11)  Vesicare 5 Mg Tabs (Solifenacin succinate) .... One by mouth daily  Other Orders: TLB-CBC Platelet - w/Differential (85025-CBCD)  Hypertension Assessment/Plan:      The patient's hypertensive risk group is category B: At  least one risk factor (excluding diabetes) with no target organ damage.  Her calculated 10 year risk of coronary heart disease is 11 %.  Today's blood pressure is 140/80.  Her blood pressure goal is < 140/90.  Patient Instructions: 1)  Please schedule a follow-up appointment in 2 months.  Appended Document: Orders Update    Clinical Lists Changes  Orders: Added new Service order of Specimen Handling ([id number]) - Signed

## 2010-03-21 NOTE — Progress Notes (Signed)
Summary: Pt needs work in appt for tomorrow with Dr. Lovell Sheehan only  Phone Note Call from Patient Call back at [phone] cell   Caller: Patient Reason for Call: Acute Illness Action Taken: Patient advised to call 911 Summary of Call: Pt was seen in ER on Sat 04/07/09 for bacterial inf in toe. Pt is suppose to follow up with PCP with 48hrs. Pt would like a work in appt for tomorrow with Dr. Lovell Sheehan only.  Initial call taken by: Lucy Antigua,  April 09, 2009 3:06 PM  Follow-up for Phone Call        per dr Lovell Sheehan- anyone can see her tomorrow- we have no openings Follow-up by: Willy Eddy, LPN,  April 09, 2009 3:13 PM  Additional Follow-up for Phone Call Additional follow up Details #1::        I called pt and sch her an appt to see Dr. Caryl Never tomorrow, as noted. Additional Follow-up by: Lucy Antigua,  April 09, 2009 3:23 PM

## 2010-04-19 ENCOUNTER — Other Ambulatory Visit: Payer: Self-pay | Admitting: Internal Medicine

## 2010-05-21 ENCOUNTER — Ambulatory Visit: Payer: Self-pay | Admitting: Internal Medicine

## 2010-05-22 ENCOUNTER — Emergency Department (HOSPITAL_COMMUNITY)
Admission: EM | Admit: 2010-05-22 | Discharge: 2010-05-23 | Disposition: A | Discharge status: Home / Self Care | Payer: No Typology Code available for payment source | Attending: Emergency Medicine | Admitting: Emergency Medicine

## 2010-05-22 DIAGNOSIS — S6000XA Contusion of unspecified finger without damage to nail, initial encounter: Secondary | ICD-10-CM | POA: Insufficient documentation

## 2010-05-22 DIAGNOSIS — S139XXA Sprain of joints and ligaments of unspecified parts of neck, initial encounter: Secondary | ICD-10-CM | POA: Insufficient documentation

## 2010-05-22 DIAGNOSIS — Z79899 Other long term (current) drug therapy: Secondary | ICD-10-CM | POA: Insufficient documentation

## 2010-05-22 DIAGNOSIS — S335XXA Sprain of ligaments of lumbar spine, initial encounter: Secondary | ICD-10-CM | POA: Insufficient documentation

## 2010-05-22 DIAGNOSIS — Z9889 Other specified postprocedural states: Secondary | ICD-10-CM | POA: Insufficient documentation

## 2010-05-22 DIAGNOSIS — M25579 Pain in unspecified ankle and joints of unspecified foot: Secondary | ICD-10-CM | POA: Insufficient documentation

## 2010-05-22 DIAGNOSIS — Y929 Unspecified place or not applicable: Secondary | ICD-10-CM | POA: Insufficient documentation

## 2010-05-22 DIAGNOSIS — J45909 Unspecified asthma, uncomplicated: Secondary | ICD-10-CM | POA: Insufficient documentation

## 2010-05-22 DIAGNOSIS — M1990 Unspecified osteoarthritis, unspecified site: Secondary | ICD-10-CM | POA: Insufficient documentation

## 2010-05-22 DIAGNOSIS — R609 Edema, unspecified: Secondary | ICD-10-CM | POA: Insufficient documentation

## 2010-05-22 DIAGNOSIS — M545 Low back pain, unspecified: Secondary | ICD-10-CM | POA: Insufficient documentation

## 2010-05-22 DIAGNOSIS — M542 Cervicalgia: Secondary | ICD-10-CM | POA: Insufficient documentation

## 2010-05-22 DIAGNOSIS — M79609 Pain in unspecified limb: Secondary | ICD-10-CM | POA: Insufficient documentation

## 2010-05-22 DIAGNOSIS — I10 Essential (primary) hypertension: Secondary | ICD-10-CM | POA: Insufficient documentation

## 2010-05-23 ENCOUNTER — Emergency Department (HOSPITAL_COMMUNITY): Payer: No Typology Code available for payment source

## 2010-06-19 ENCOUNTER — Encounter: Payer: Self-pay | Admitting: Internal Medicine

## 2010-06-25 ENCOUNTER — Encounter: Payer: Self-pay | Admitting: Internal Medicine

## 2010-06-25 ENCOUNTER — Ambulatory Visit (INDEPENDENT_AMBULATORY_CARE_PROVIDER_SITE_OTHER): Payer: PRIVATE HEALTH INSURANCE | Admitting: Internal Medicine

## 2010-06-25 VITALS — BP 130/80 | HR 76 | Temp 98.2°F | Resp 16 | Ht 59.0 in | Wt 280.0 lb

## 2010-06-25 DIAGNOSIS — M810 Age-related osteoporosis without current pathological fracture: Secondary | ICD-10-CM

## 2010-06-25 DIAGNOSIS — I10 Essential (primary) hypertension: Secondary | ICD-10-CM

## 2010-06-25 DIAGNOSIS — J45909 Unspecified asthma, uncomplicated: Secondary | ICD-10-CM

## 2010-06-25 MED ORDER — ALENDRONATE SODIUM 70 MG PO TABS: 70.0000 mg | ORAL_TABLET | ORAL | Status: DC

## 2010-06-25 MED ORDER — ALBUTEROL SULFATE HFA 108 (90 BASE) MCG/ACT IN AERS: 2.0000 | INHALATION_SPRAY | Freq: Four times a day (QID) | RESPIRATORY_TRACT | Status: DC | PRN
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
## 2010-07-05 NOTE — Op Note (Signed)
[place]  Patient:    Melissa Kim, Melissa Kim                       MRN: [id number] Proc. Date: 03/05/00 Adm. Date:  [id number] Attending:  Bonnetta Barry CC:         Stacie Glaze, M.D. [facility]                           Operative Report  PREOPERATIVE DIAGNOSIS:  Symptomatic cholelithiasis.  POSTOPERATIVE DIAGNOSIS:  Chronic cholecystitis, cholelithiasis.  OPERATION PERFORMED:  Laparoscopic cholecystectomy.  SURGEON:  Velora Heckler, M.D.  ASSISTANT:  Adolph Pollack, M.D.  ANESTHESIA:  General.  ANESTHESIOLOGIST:  Dr. Randa Evens.  ESTIMATED BLOOD LOSS:  Minimal.  PREPARATION:  Betadine.  COMPLICATIONS:  None.  INDICATIONS FOR PROCEDURE:  The patient is a 55 year old white female who presents with a two-year history of intermittent epigastric abdominal pain associated with nausea and occasional emesis.  Most recent episode was February 12, 2000.  She was seen at [facility]. Ultrasound at that time revealed a large gallstone impacted in the neck of the gallbladder with sludge in the gallbladder.  Laboratory studies were normal. the patient now presents for cholecystectomy for symptomatic cholelithiasis.  DESCRIPTION OF PROCEDURE:  The procedure was done in OR #17 at the [facility].  The patient was brought to the operating room and placed in supine position on the operating room table.  Following administration of general anesthesia, the patient was prepped and draped in the usual strict aseptic fashion.  After ascertaining that an adequate level of anesthesia had been obtained, an infraumbilical incision was made in the midline at the site of an old surgical wound.  Dissection was carried down to the fascia.  The fascia was incised in the midline and the peritoneal cavity was entered cautiously.  Omental adhesions were freed from the abdominal wall with blunt dissection.  A 0 Vicryl suture was  placed in the fascia as a pursestring to secure the Hasson cannula at the umbilicus.  The abdomen was then insufflated with carbon dioxide.  The laparoscope was introduced under direct vision.  The operative port was placed in the subxiphoid position. Adhesions to the anterior abdominal wall were taken down bluntly with the electrocautery used for hemostasis.  Two further operative ports were placed on the right abdominal wall.  The gallbladder was grasped and retracted cephalad.  The gallbladder was mildly intrahepatic.  There is a large amount of intra-abdominal adipose tissue making visualization somewhat difficult. The neck of the gallbladder is dissected out.  The periotoneum was incised. The cystic duct was identified and dissected out along its length.  It is doubly clipped and divided.  The anterior branch of the cystic artery was dissected out, doubly clipped and divided.  The posterior branch of the cystic artery was dissected out, doubly clipped, and divided.  The gallbladder was excised from the gallbladder bed using the spatula electrocautery for hemostasis.  The gallbladder was inadvertently opened.  A large stone was noted and an endocatch bag was used to capture the stone and withdraw it through the umbilical port.  The remaining gallbladder was then excised from the gallbladder bed including the intrahepatic part near its fundus.  The gallbladder was placed into an endocatch bag and also withdrawn through the umbilical port.  The right upper quadrant was irrigated copiously and hemostasis  was obtained with the electrocautery in the gallbladder bed.  Fluid was evacuated.  Ports were removed under direct vision and good hemostasis was noted.  The pneumoperitoneum was released.  Port sites were anesthetized with local anesthetic.  0 Vicryl pursestring sutures tied securely at the umbilicus.  All four wounds were closed with interrupted 4-0 Vicryl subcuticular sutures.  The  wounds were washed and dried and benzoin and Steri-Strips were applied.  Sterile gauze dressings were applied.  The patient was awakened from anesthesia and transferred to the recovery room in stable condition.  The patient tolerated the procedure well. DD:  03/05/00 TD:  03/05/00 Job: [id number]

## 2010-09-20 ENCOUNTER — Other Ambulatory Visit: Payer: Self-pay | Admitting: Internal Medicine

## 2010-10-01 ENCOUNTER — Ambulatory Visit (INDEPENDENT_AMBULATORY_CARE_PROVIDER_SITE_OTHER): Payer: PRIVATE HEALTH INSURANCE | Admitting: Internal Medicine

## 2010-10-01 ENCOUNTER — Encounter: Payer: Self-pay | Admitting: Internal Medicine

## 2010-10-01 DIAGNOSIS — I10 Essential (primary) hypertension: Secondary | ICD-10-CM

## 2010-10-01 DIAGNOSIS — K219 Gastro-esophageal reflux disease without esophagitis: Secondary | ICD-10-CM

## 2010-10-01 LAB — CBC WITH DIFFERENTIAL/PLATELET
Basophils Relative: 0.3 % (ref 0.0–3.0)
Eosinophils Relative: 1.6 % (ref 0.0–5.0)
HCT: 41 % (ref 36.0–46.0)
Lymphs Abs: 2 10*3/uL (ref 0.7–4.0)
MCV: 89.9 fl (ref 78.0–100.0)
Monocytes Absolute: 0.4 10*3/uL (ref 0.1–1.0)
Neutro Abs: 5 10*3/uL (ref 1.4–7.7)
Platelets: 223 10*3/uL (ref 150.0–400.0)
RBC: 4.56 Mil/uL (ref 3.87–5.11)
WBC: 7.6 10*3/uL (ref 4.5–10.5)

## 2010-10-01 LAB — BASIC METABOLIC PANEL
BUN: 16 mg/dL (ref 6–23)
Chloride: 97 mEq/L (ref 96–112)
Potassium: 3.7 mEq/L (ref 3.5–5.1)

## 2010-10-01 NOTE — Progress Notes (Signed)
Subjective:    Patient ID: Melissa Kim, female    DOB: March 15, 1955, 55 y.o.   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  Her asthma has been stable and she has not had to use her rescue inhaler She's been compliant with her blood pressure medicine or gastroesophageal reflux stable as long as she does not eat too much or too late  Review of Systems  Constitutional: Negative for activity change, appetite change and fatigue.  HENT: Negative for ear pain, congestion, neck pain, postnasal drip and sinus pressure.   Eyes: Negative for redness and visual disturbance.  Respiratory: Negative for cough, shortness of breath and wheezing.   Gastrointestinal: Negative for abdominal pain and abdominal distention.  Genitourinary: Negative for dysuria, frequency and menstrual problem.  Musculoskeletal: Negative for myalgias, joint swelling and arthralgias.  Skin: Negative for rash and wound.  Neurological: Negative for dizziness, weakness and headaches.  Hematological: Negative for adenopathy. Does not bruise/bleed easily.  Psychiatric/Behavioral: Negative for sleep disturbance and decreased concentration.   Past Medical History  Diagnosis Date  . Asthma   . Obesity   . Psoriasis   . Venous stasis dermatitis   . GERD (gastroesophageal reflux disease)   . Hypertension   . Fx foot bone NEC-closed   . Diverticulosis   . History of colonic diverticulitis    Past Surgical History  Procedure Date  . Appendectomy   . Cholecystectomy   . Abdominal hysterectomy   . Tonsikl   . Tonsillectomy   . Arthroscopic  of knee     reports that she has never smoked. She does not have any smokeless tobacco history on file. She reports that she does not drink alcohol or use illicit drugs. family history includes Heart disease in her father; Hypertension in her mother; Obesity in an unspecified family member; and Stroke in her father. Allergies  Allergen Reactions  . Aspirin     REACTION: Gi upset  . Hyzaar     REACTION: itching  . Ibuprofen     REACTION: GI upset  . Peg 3350-Kcl-Nabcb-Nacl-Nasulf     REACTION: vomiting  . Strawberry Flavor     REACTION: hives       Objective:   Physical Exam  Constitutional: She is oriented to person, place, and time. She appears well-developed and well-nourished. No distress.  HENT:  Head: Normocephalic and atraumatic.  Right Ear: External ear normal.  Left Ear: External ear normal.  Nose: Nose normal.  Mouth/Throat: Oropharynx is clear and moist.  Eyes: Conjunctivae and EOM are normal. Pupils are equal, round, and reactive to light.  Neck: Normal range of motion. Neck supple. No JVD present. No tracheal deviation present. No thyromegaly present.  Cardiovascular: Normal rate, regular rhythm, normal heart sounds and intact distal pulses.   No murmur heard. Pulmonary/Chest: Effort normal and breath sounds normal. She has no wheezes. She exhibits no tenderness.  Abdominal: Soft. Bowel sounds are normal.  Musculoskeletal: Normal range of motion. She exhibits no edema and no tenderness.  Lymphadenopathy:    She has no cervical adenopathy.  Neurological: She is alert and oriented to person, place, and time. She has normal reflexes. No  cranial nerve deficit.  Skin: Skin is warm and dry. She is not diaphoretic.  Psychiatric: She has a normal mood and affect. Her behavior is normal.          Assessment & Plan:  Morbid obesity again weight loss is urged to follow the diet modification portions being careful not to eat desserts and drink sweet  tea;Marland Kitchen  Degenerative joint disease of the knees severe degenerative joint disease with loss of cartilage total knee replacement will be required soon she is concerned about her ability to continue to work and I am as well. I have suggested that she return to the orthopedist to get a functional study done to see if their assessment is that she can perform her job.  She is concerned that she needs to apply for disability soon and this would be after a functional assessment has been made.  Her asthma and hypertension and gastroesophageal reflux are stable

## 2010-10-02 ENCOUNTER — Ambulatory Visit: Payer: PRIVATE HEALTH INSURANCE | Admitting: Internal Medicine

## 2010-12-15 ENCOUNTER — Emergency Department (HOSPITAL_COMMUNITY): Payer: No Typology Code available for payment source

## 2010-12-15 ENCOUNTER — Other Ambulatory Visit: Payer: Self-pay | Admitting: Internal Medicine

## 2010-12-15 ENCOUNTER — Encounter (HOSPITAL_COMMUNITY): Payer: Self-pay | Admitting: Radiology

## 2010-12-15 ENCOUNTER — Emergency Department (HOSPITAL_COMMUNITY)
Admission: EM | Admit: 2010-12-15 | Discharge: 2010-12-15 | Disposition: A | Discharge status: Home / Self Care | Payer: No Typology Code available for payment source | Attending: Emergency Medicine | Admitting: Emergency Medicine

## 2010-12-15 DIAGNOSIS — Z79899 Other long term (current) drug therapy: Secondary | ICD-10-CM | POA: Insufficient documentation

## 2010-12-15 DIAGNOSIS — I10 Essential (primary) hypertension: Secondary | ICD-10-CM | POA: Insufficient documentation

## 2010-12-15 DIAGNOSIS — S301XXA Contusion of abdominal wall, initial encounter: Secondary | ICD-10-CM | POA: Insufficient documentation

## 2010-12-15 DIAGNOSIS — R079 Chest pain, unspecified: Secondary | ICD-10-CM | POA: Insufficient documentation

## 2010-12-15 DIAGNOSIS — M1990 Unspecified osteoarthritis, unspecified site: Secondary | ICD-10-CM | POA: Insufficient documentation

## 2010-12-15 DIAGNOSIS — M25519 Pain in unspecified shoulder: Secondary | ICD-10-CM | POA: Insufficient documentation

## 2010-12-15 DIAGNOSIS — R109 Unspecified abdominal pain: Secondary | ICD-10-CM | POA: Insufficient documentation

## 2010-12-15 HISTORY — DX: Malignant (primary) neoplasm, unspecified: C80.1

## 2010-12-15 LAB — POCT I-STAT, CHEM 8
Calcium, Ion: 1.17 mmol/L (ref 1.12–1.32)
Creatinine, Ser: 0.8 mg/dL (ref 0.50–1.10)
Glucose, Bld: 120 mg/dL — ABNORMAL HIGH (ref 70–99)
Hemoglobin: 13.9 g/dL (ref 12.0–15.0)
TCO2: 28 mmol/L (ref 0–100)

## 2010-12-15 LAB — URINALYSIS, ROUTINE W REFLEX MICROSCOPIC
Bilirubin Urine: NEGATIVE
Glucose, UA: NEGATIVE mg/dL
Hgb urine dipstick: NEGATIVE
Ketones, ur: NEGATIVE mg/dL
Leukocytes, UA: NEGATIVE
Protein, ur: NEGATIVE mg/dL
pH: 5 (ref 5.0–8.0)

## 2010-12-15 LAB — CBC
HCT: 40.1 % (ref 36.0–46.0)
Hemoglobin: 13.1 g/dL (ref 12.0–15.0)
MCH: 30 pg (ref 26.0–34.0)
MCHC: 32.7 g/dL (ref 30.0–36.0)
MCV: 91.8 fL (ref 78.0–100.0)
RBC: 4.37 MIL/uL (ref 3.87–5.11)

## 2010-12-15 MED ORDER — IOHEXOL 300 MG/ML  SOLN
150.0000 mL | Freq: Once | INTRAMUSCULAR | Status: AC | PRN
  Administered 2010-12-15: 150 mL via INTRAVENOUS

## 2010-12-17 ENCOUNTER — Encounter: Payer: Self-pay | Admitting: Family Medicine

## 2010-12-17 ENCOUNTER — Ambulatory Visit (INDEPENDENT_AMBULATORY_CARE_PROVIDER_SITE_OTHER): Payer: PRIVATE HEALTH INSURANCE | Admitting: Family Medicine

## 2010-12-17 VITALS — BP 130/68 | Temp 97.8°F | Wt 294.0 lb

## 2010-12-17 DIAGNOSIS — S20219A Contusion of unspecified front wall of thorax, initial encounter: Secondary | ICD-10-CM

## 2010-12-17 DIAGNOSIS — S301XXA Contusion of abdominal wall, initial encounter: Secondary | ICD-10-CM

## 2010-12-17 MED ORDER — HYDROCODONE-ACETAMINOPHEN 5-500 MG PO TABS: 1.0000 | ORAL_TABLET | Freq: Four times a day (QID) | ORAL | Status: DC | PRN
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
## 2010-12-17 NOTE — Patient Instructions (Signed)
Start stool softener today such as colace or Senokot.

## 2010-12-30 ENCOUNTER — Ambulatory Visit (INDEPENDENT_AMBULATORY_CARE_PROVIDER_SITE_OTHER): Payer: PRIVATE HEALTH INSURANCE | Admitting: Internal Medicine

## 2010-12-30 ENCOUNTER — Encounter: Payer: Self-pay | Admitting: Internal Medicine

## 2010-12-30 VITALS — BP 140/80 | HR 76 | Temp 98.3°F | Resp 16 | Ht 59.0 in | Wt 294.0 lb

## 2010-12-30 DIAGNOSIS — R0602 Shortness of breath: Secondary | ICD-10-CM

## 2010-12-30 DIAGNOSIS — J45909 Unspecified asthma, uncomplicated: Secondary | ICD-10-CM

## 2010-12-30 DIAGNOSIS — S20219A Contusion of unspecified front wall of thorax, initial encounter: Secondary | ICD-10-CM | POA: Insufficient documentation

## 2010-12-30 DIAGNOSIS — I10 Essential (primary) hypertension: Secondary | ICD-10-CM

## 2010-12-30 DIAGNOSIS — Z23 Encounter for immunization: Secondary | ICD-10-CM
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
## 2010-12-31 LAB — CBC WITH DIFFERENTIAL/PLATELET
Basophils Absolute: 0.1 10*3/uL (ref 0.0–0.1)
HCT: 35.3 % — ABNORMAL LOW (ref 36.0–46.0)
Hemoglobin: 11.6 g/dL — ABNORMAL LOW (ref 12.0–15.0)
Lymphs Abs: 1.5 10*3/uL (ref 0.7–4.0)
MCV: 89.4 fl (ref 78.0–100.0)
Monocytes Absolute: 0.6 10*3/uL (ref 0.1–1.0)
Monocytes Relative: 6.8 % (ref 3.0–12.0)
Neutro Abs: 6.8 10*3/uL (ref 1.4–7.7)
Platelets: 438 10*3/uL — ABNORMAL HIGH (ref 150.0–400.0)
RDW: 14.6 % (ref 11.5–14.6)

## 2010-12-31 LAB — BASIC METABOLIC PANEL
CO2: 32 mEq/L (ref 19–32)
Calcium: 9.2 mg/dL (ref 8.4–10.5)
Creatinine, Ser: 0.6 mg/dL (ref 0.4–1.2)
GFR: 101.53 mL/min (ref >60.00)
Glucose, Bld: 88 mg/dL (ref 70–99)
Sodium: 140 mEq/L (ref 135–145)

## 2011-01-23 ENCOUNTER — Encounter (HOSPITAL_COMMUNITY): Payer: Self-pay | Admitting: Emergency Medicine

## 2011-01-23 ENCOUNTER — Emergency Department (HOSPITAL_COMMUNITY)
Admission: EM | Admit: 2011-01-23 | Discharge: 2011-01-24 | Disposition: A | Discharge status: Home / Self Care | Payer: No Typology Code available for payment source | Attending: Emergency Medicine | Admitting: Emergency Medicine

## 2011-01-23 DIAGNOSIS — K219 Gastro-esophageal reflux disease without esophagitis: Secondary | ICD-10-CM | POA: Insufficient documentation

## 2011-01-23 DIAGNOSIS — I10 Essential (primary) hypertension: Secondary | ICD-10-CM | POA: Insufficient documentation

## 2011-01-23 DIAGNOSIS — E669 Obesity, unspecified: Secondary | ICD-10-CM | POA: Insufficient documentation

## 2011-01-23 DIAGNOSIS — S301XXA Contusion of abdominal wall, initial encounter: Secondary | ICD-10-CM | POA: Insufficient documentation

## 2011-01-23 DIAGNOSIS — R109 Unspecified abdominal pain: Secondary | ICD-10-CM | POA: Insufficient documentation

## 2011-01-23 DIAGNOSIS — J45909 Unspecified asthma, uncomplicated: Secondary | ICD-10-CM | POA: Insufficient documentation

## 2011-01-23 LAB — DIFFERENTIAL
Basophils Relative: 1 % (ref 0–1)
Eosinophils Absolute: 0.2 10*3/uL (ref 0.0–0.7)
Neutro Abs: 6.8 10*3/uL (ref 1.7–7.7)
Neutrophils Relative %: 72 % (ref 43–77)

## 2011-01-23 LAB — HEPATIC FUNCTION PANEL
ALT: 17 U/L (ref 0–35)
AST: 15 U/L (ref 0–37)
Albumin: 3.7 g/dL (ref 3.5–5.2)
Bilirubin, Direct: <0.1 mg/dL (ref 0.0–0.3)
Total Bilirubin: 0.4 mg/dL (ref 0.3–1.2)
Total Protein: 7.7 g/dL (ref 6.0–8.3)

## 2011-01-23 LAB — BASIC METABOLIC PANEL
Chloride: 96 mEq/L (ref 96–112)
GFR calc Af Amer: >90 mL/min (ref >90)
GFR calc non Af Amer: >90 mL/min (ref >90)
Potassium: 3.4 mEq/L — ABNORMAL LOW (ref 3.5–5.1)
Sodium: 138 mEq/L (ref 135–145)

## 2011-01-23 LAB — CBC
MCH: 28.2 pg (ref 26.0–34.0)
MCHC: 31.3 g/dL (ref 30.0–36.0)
Platelets: 221 10*3/uL (ref 150–400)
RBC: 4.47 MIL/uL (ref 3.87–5.11)

## 2011-01-23 LAB — LIPASE, BLOOD: Lipase: 36 U/L (ref 11–59)
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
## 2011-01-23 NOTE — ED Notes (Signed)
Lab tech advised nurse that the initial urine specimen is not enough for urinalysis . Will attempt to recollect specimen / pt. Notified.

## 2011-01-23 NOTE — ED Notes (Signed)
PT. REPORTS LOW ABDOMINAL PAIN FOR 2 MONTHS ,  DENIES NAUSEA OR VOMITTING , NO DIARRHEA , NO FEVER OR CHILLS .

## 2011-01-24 ENCOUNTER — Emergency Department (HOSPITAL_COMMUNITY): Payer: No Typology Code available for payment source

## 2011-01-24 LAB — URINALYSIS, ROUTINE W REFLEX MICROSCOPIC
Nitrite: NEGATIVE
Specific Gravity, Urine: 1.018 (ref 1.005–1.030)
Urobilinogen, UA: 1 mg/dL (ref 0.0–1.0)

## 2011-01-24 LAB — URINE MICROSCOPIC-ADD ON

## 2011-01-24 MED ORDER — IOHEXOL 300 MG/ML  SOLN
100.0000 mL | Freq: Once | INTRAMUSCULAR | Status: AC | PRN
  Administered 2011-01-24: 100 mL via INTRAVENOUS

## 2011-04-01 ENCOUNTER — Ambulatory Visit (INDEPENDENT_AMBULATORY_CARE_PROVIDER_SITE_OTHER): Payer: PRIVATE HEALTH INSURANCE | Admitting: Internal Medicine

## 2011-04-01 ENCOUNTER — Encounter: Payer: Self-pay | Admitting: Internal Medicine

## 2011-04-01 VITALS — BP 142/82 | HR 84 | Temp 98.2°F | Ht 59.0 in | Wt 285.0 lb

## 2011-04-01 DIAGNOSIS — I10 Essential (primary) hypertension: Secondary | ICD-10-CM

## 2011-04-01 DIAGNOSIS — R109 Unspecified abdominal pain: Secondary | ICD-10-CM

## 2011-04-01 LAB — BASIC METABOLIC PANEL
BUN: 11 mg/dL (ref 6–23)
CO2: 29 mEq/L (ref 19–32)
Chloride: 104 mEq/L (ref 96–112)
Creatinine, Ser: 0.5 mg/dL (ref 0.4–1.2)
Potassium: 4.1 mEq/L (ref 3.5–5.1)

## 2011-04-01 MED ORDER — ENALAPRIL MALEATE 10 MG PO TABS: 10.0000 mg | ORAL_TABLET | Freq: Every day | ORAL | Status: DC

## 2011-04-01 NOTE — Patient Instructions (Signed)
The patient is instructed to continue all medications as prescribed. Schedule followup with check out clerk upon leaving the clinic  
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
## 2011-07-01 ENCOUNTER — Ambulatory Visit: Payer: PRIVATE HEALTH INSURANCE | Admitting: Internal Medicine

## 2011-08-05 ENCOUNTER — Ambulatory Visit (INDEPENDENT_AMBULATORY_CARE_PROVIDER_SITE_OTHER): Payer: BC Managed Care – PPO | Admitting: Internal Medicine

## 2011-08-05 VITALS — BP 144/84 | HR 80 | Temp 98.2°F | Resp 18 | Ht 59.0 in | Wt 282.0 lb

## 2011-08-05 DIAGNOSIS — J45909 Unspecified asthma, uncomplicated: Secondary | ICD-10-CM

## 2011-08-05 DIAGNOSIS — R52 Pain, unspecified: Secondary | ICD-10-CM

## 2011-08-05 DIAGNOSIS — I10 Essential (primary) hypertension: Secondary | ICD-10-CM

## 2011-08-05 MED ORDER — GABAPENTIN 100 MG PO CAPS: 100.0000 mg | ORAL_CAPSULE | Freq: Three times a day (TID) | ORAL | Status: DC
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
## 2011-08-05 NOTE — Patient Instructions (Signed)
The patient is instructed to continue all medications as prescribed. Schedule followup with check out clerk upon leaving the clinic  

## 2011-11-11 ENCOUNTER — Ambulatory Visit: Payer: PRIVATE HEALTH INSURANCE | Admitting: Internal Medicine

## 2011-11-11 ENCOUNTER — Encounter: Payer: Self-pay | Admitting: Internal Medicine

## 2011-11-25 ENCOUNTER — Ambulatory Visit (INDEPENDENT_AMBULATORY_CARE_PROVIDER_SITE_OTHER): Payer: BC Managed Care – PPO | Admitting: Internal Medicine

## 2011-11-25 ENCOUNTER — Encounter: Payer: Self-pay | Admitting: Internal Medicine

## 2011-11-25 DIAGNOSIS — I10 Essential (primary) hypertension: Secondary | ICD-10-CM

## 2011-11-25 DIAGNOSIS — J449 Chronic obstructive pulmonary disease, unspecified: Secondary | ICD-10-CM

## 2011-11-25 DIAGNOSIS — Z23 Encounter for immunization: Secondary | ICD-10-CM

## 2011-11-25 LAB — BASIC METABOLIC PANEL
CO2: 28 mEq/L (ref 19–32)
Calcium: 9 mg/dL (ref 8.4–10.5)
Chloride: 103 mEq/L (ref 96–112)
Glucose, Bld: 94 mg/dL (ref 70–99)
Sodium: 139 mEq/L (ref 135–145)

## 2011-11-25 LAB — LIPID PANEL: HDL: 65.7 mg/dL (ref >39.00)

## 2011-11-25 NOTE — Progress Notes (Signed)
Subjective:    Patient ID: Melissa Kim, female    DOB: 1955/11/10, 56 y.o.   MRN: [id number]  HPI Patient is a 56 year old female with morbid obesity gastroesophageal reflux hypertension and multifactorial asthma/COPD.  She presents today for followup of her obesity and hypertension having gained weight and her blood pressure is elevated as a result.  She states that she is dietary noncompliance because at work they have been having a lot of "cobblers" and that she has been eating more pork barbecue which is salty.  We discussed the implication of her obesity long-term health including the fact that she has had multiple joint complaints including stress fractures of her lower extremity and that she has severe asthma as well as reflux.     Review of Systems  Constitutional: Positive for fatigue. Negative for activity change and appetite change.       Obese  HENT: Positive for congestion and rhinorrhea. Negative for ear pain, neck pain, postnasal drip and sinus pressure.   Eyes: Negative.  Negative for redness and visual disturbance.  Respiratory: Positive for cough and shortness of breath. Negative for wheezing.   Cardiovascular: Negative.   Gastrointestinal: Positive for abdominal distention. Negative for abdominal pain.  Genitourinary: Positive for frequency. Negative for dysuria and menstrual problem.  Musculoskeletal: Positive for arthralgias and gait problem. Negative for myalgias and joint swelling.  Skin: Negative for rash and wound.  Neurological: Negative.  Negative for dizziness, weakness and headaches.  Hematological: Negative for adenopathy. Does not bruise/bleed easily.  Psychiatric/Behavioral: Negative.  Negative for disturbed wake/sleep cycle and decreased concentration.   Past Medical History  Diagnosis Date  . Asthma   . Obesity   . Psoriasis   . Venous stasis dermatitis   . GERD (gastroesophageal reflux disease)   . Hypertension   . Fx foot bone  NEC-closed   . Diverticulosis   . History of colonic diverticulitis   . Cancer     History   Social History  . Marital Status: Married    Spouse Name: N/A    Number of Children: N/A  . Years of Education: N/A   Occupational History  . Not on file.   Social History Main Topics  . Smoking status: Never Smoker   . Smokeless tobacco: Not on file  . Alcohol Use: No  . Drug Use: No  . Sexually Active: Not on file   Other Topics Concern  . Not on file   Social History Narrative  . No narrative on file    Past Surgical History  Procedure Date  . Appendectomy   . Cholecystectomy   . Abdominal hysterectomy   . Tonsikl   . Tonsillectomy   . Arthroscopic of knee     Family History  Problem Relation Age of Onset  . Obesity    . Hypertension Mother   . Heart disease Father   . Stroke Father     Allergies  Allergen Reactions  . Aspirin     REACTION: Gi upset  . Ibuprofen     REACTION: GI upset  . Losartan Potassium-Hctz     REACTION: itching  . Peg 3350-Electrolytes     REACTION: vomiting  . Strawberry Flavor     REACTION: hives      BP 144/80  Pulse 80  Temp 98 F (36.7 C)  Resp 20  Ht 4\' 11"  (1.499 m)  Wt 291 lb (131.997 kg)  BMI 58.77 kg/m2      Objective:  Physical Exam  Nursing note and vitals reviewed. Constitutional: She is oriented to person, place, and time. No distress.       Morbidly obese  HENT:  Head: Normocephalic and atraumatic.  Right Ear: External ear normal.  Left Ear: External ear normal.  Nose: Nose normal.  Mouth/Throat: Oropharynx is clear and moist.  Eyes: Conjunctivae normal and EOM are normal. Pupils are equal, round, and reactive to light.  Neck: Normal range of motion. Neck supple. No JVD present. No tracheal deviation present. No thyromegaly present.  Cardiovascular: Normal rate, regular rhythm, normal heart sounds and intact distal pulses.   No murmur heard. Pulmonary/Chest: Effort normal and breath sounds  normal. She has no wheezes. She exhibits no tenderness.       Decreased breath sounds at the bases  Abdominal: Soft. Bowel sounds are normal. She exhibits distension. There is no tenderness. There is no rebound.  Musculoskeletal: She exhibits edema.  Lymphadenopathy:    She has no cervical adenopathy.  Neurological: She is alert and oriented to person, place, and time. She has normal reflexes. No cranial nerve deficit.  Skin: Skin is warm and dry. She is not diaphoretic.  Psychiatric: She has a normal mood and affect. Her behavior is normal.        Assessment & Plan:  Patient has hypertension that is not well controlled due to her obesity and weight gain.  We discussed her diet we identified items in her diet the need to be a laminated including excessive sweets and high salt foods.  We discussed walking and exercise.  Patient's asthma has been relatively stable although the fall is one of her allergy seasons. We discussed using her prophylactic medication which is her Advair

## 2011-11-25 NOTE — Patient Instructions (Signed)
The patient is instructed to continue all medications as prescribed. Schedule followup with check out clerk upon leaving the clinic  

## 2011-12-29 ENCOUNTER — Other Ambulatory Visit: Payer: Self-pay | Admitting: Internal Medicine

## 2012-03-30 ENCOUNTER — Ambulatory Visit: Payer: BC Managed Care – PPO | Admitting: Internal Medicine

## 2012-04-08 ENCOUNTER — Other Ambulatory Visit: Payer: Self-pay | Admitting: Internal Medicine

## 2012-04-16 ENCOUNTER — Other Ambulatory Visit: Payer: Self-pay | Admitting: Internal Medicine

## 2012-06-16 ENCOUNTER — Ambulatory Visit: Payer: BC Managed Care – PPO | Admitting: Internal Medicine

## 2012-07-01 ENCOUNTER — Other Ambulatory Visit: Payer: Self-pay | Admitting: Internal Medicine

## 2012-07-02 ENCOUNTER — Other Ambulatory Visit: Payer: Self-pay | Admitting: *Deleted

## 2012-07-02 MED ORDER — ENALAPRIL MALEATE 10 MG PO TABS: ORAL_TABLET | ORAL | Status: DC

## 2012-08-09 ENCOUNTER — Encounter: Payer: Self-pay | Admitting: Internal Medicine

## 2012-08-09 ENCOUNTER — Ambulatory Visit (INDEPENDENT_AMBULATORY_CARE_PROVIDER_SITE_OTHER): Payer: BC Managed Care – PPO | Admitting: Internal Medicine

## 2012-08-09 DIAGNOSIS — K219 Gastro-esophageal reflux disease without esophagitis: Secondary | ICD-10-CM

## 2012-08-09 DIAGNOSIS — I10 Essential (primary) hypertension: Secondary | ICD-10-CM

## 2012-08-09 DIAGNOSIS — J449 Chronic obstructive pulmonary disease, unspecified: Secondary | ICD-10-CM

## 2012-08-09 DIAGNOSIS — Z23 Encounter for immunization: Secondary | ICD-10-CM

## 2012-08-09 LAB — BASIC METABOLIC PANEL
BUN: 12 mg/dL (ref 6–23)
Calcium: 8.9 mg/dL (ref 8.4–10.5)
Creatinine, Ser: 0.5 mg/dL (ref 0.4–1.2)
GFR: 122.83 mL/min (ref >60.00)
Glucose, Bld: 108 mg/dL — ABNORMAL HIGH (ref 70–99)

## 2012-08-09 LAB — CBC WITH DIFFERENTIAL/PLATELET
Basophils Relative: 0.4 % (ref 0.0–3.0)
Eosinophils Relative: 2.2 % (ref 0.0–5.0)
HCT: 38.4 % (ref 36.0–46.0)
Hemoglobin: 12.8 g/dL (ref 12.0–15.0)
Lymphs Abs: 1.7 10*3/uL (ref 0.7–4.0)
MCV: 91.2 fl (ref 78.0–100.0)
Monocytes Absolute: 0.4 10*3/uL (ref 0.1–1.0)
Neutro Abs: 4.3 10*3/uL (ref 1.4–7.7)
Platelets: 196 10*3/uL (ref 150.0–400.0)
RBC: 4.21 Mil/uL (ref 3.87–5.11)
WBC: 6.6 10*3/uL (ref 4.5–10.5)

## 2012-08-09 NOTE — Progress Notes (Signed)
  Subjective:    Patient ID: Melissa Kim, female    DOB: 1955/05/08, 57 y.o.   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       Morbid obesisty  HENT: Negative for ear pain, congestion, neck pain, postnasal drip and sinus pressure.   Eyes: Negative for redness and visual disturbance.  Respiratory: Negative for cough, shortness of breath and wheezing.   Gastrointestinal: Negative for abdominal pain and abdominal distention.  Genitourinary: Negative for dysuria, frequency and menstrual problem.  Musculoskeletal: Positive for joint swelling and gait problem. Negative for myalgias and arthralgias.       Foot pain  Skin: Negative for rash and wound.  Neurological: Negative for dizziness, weakness and headaches.  Hematological: Negative for adenopathy. Does not bruise/bleed easily.  Psychiatric/Behavioral: Negative for sleep disturbance and decreased concentration.       Objective:   Physical Exam  Constitutional: She is oriented to person, place, and time. She appears well-developed and well-nourished. No distress.  Morbidly obese white female  HENT:  Head: Normocephalic and atraumatic.  Right Ear: External ear normal.  Left Ear: External ear normal.  Nose: Nose normal.  Mouth/Throat: Oropharynx is clear and moist.  Eyes: Conjunctivae and EOM are normal. Pupils are equal, round, and reactive to light.  Neck: Normal range of motion. Neck supple. No JVD present. No tracheal deviation present. No thyromegaly present.  Cardiovascular: Normal rate, regular rhythm, normal heart sounds and intact distal pulses.   No murmur heard. Pulmonary/Chest: Effort normal and breath sounds normal. She has no wheezes. She exhibits no tenderness.  Abdominal: Soft. Bowel  sounds are normal.  Musculoskeletal: She exhibits edema and tenderness.  Lymphadenopathy:    She has no cervical adenopathy.  Neurological: She is alert and oriented to person, place, and time. She has normal reflexes. No cranial nerve deficit.  Skin: Skin is warm and dry. She is not diaphoretic.  Psychiatric: She has a normal mood and affect. Her behavior is normal.          Assessment & Plan:  HTN stable bmet ordered for renal function monitoring COPD stable DPT ordered mammogram ordered

## 2012-08-09 NOTE — Patient Instructions (Addendum)
Weight loss needed mammogram  needed patient to schedule

## 2012-08-09 NOTE — Addendum Note (Signed)
Addended by: Willy Eddy on: 08/09/2012 09:26 AM   Modules accepted: Orders

## 2012-10-31 ENCOUNTER — Encounter (HOSPITAL_COMMUNITY): Payer: Self-pay | Admitting: Nurse Practitioner

## 2012-10-31 ENCOUNTER — Emergency Department (HOSPITAL_COMMUNITY)
Admission: EM | Admit: 2012-10-31 | Discharge: 2012-10-31 | Disposition: A | Discharge status: Home / Self Care | Payer: BC Managed Care – PPO | Attending: Emergency Medicine | Admitting: Emergency Medicine

## 2012-10-31 DIAGNOSIS — I10 Essential (primary) hypertension: Secondary | ICD-10-CM | POA: Insufficient documentation

## 2012-10-31 DIAGNOSIS — E669 Obesity, unspecified: Secondary | ICD-10-CM | POA: Insufficient documentation

## 2012-10-31 DIAGNOSIS — IMO0002 Reserved for concepts with insufficient information to code with codable children: Secondary | ICD-10-CM | POA: Insufficient documentation

## 2012-10-31 DIAGNOSIS — J45909 Unspecified asthma, uncomplicated: Secondary | ICD-10-CM | POA: Insufficient documentation

## 2012-10-31 DIAGNOSIS — L408 Other psoriasis: Secondary | ICD-10-CM | POA: Insufficient documentation

## 2012-10-31 DIAGNOSIS — R209 Unspecified disturbances of skin sensation: Secondary | ICD-10-CM | POA: Insufficient documentation

## 2012-10-31 DIAGNOSIS — Z79899 Other long term (current) drug therapy: Secondary | ICD-10-CM | POA: Insufficient documentation

## 2012-10-31 DIAGNOSIS — R200 Anesthesia of skin: Secondary | ICD-10-CM

## 2012-10-31 DIAGNOSIS — Z8719 Personal history of other diseases of the digestive system: Secondary | ICD-10-CM | POA: Insufficient documentation

## 2012-10-31 DIAGNOSIS — Z8781 Personal history of (healed) traumatic fracture: Secondary | ICD-10-CM | POA: Insufficient documentation

## 2012-10-31 DIAGNOSIS — Z85828 Personal history of other malignant neoplasm of skin: Secondary | ICD-10-CM | POA: Insufficient documentation

## 2012-10-31 LAB — CBC WITH DIFFERENTIAL/PLATELET
Basophils Absolute: 0.1 10*3/uL (ref 0.0–0.1)
Eosinophils Relative: 3 % (ref 0–5)
HCT: 38.4 % (ref 36.0–46.0)
Lymphocytes Relative: 23 % (ref 12–46)
Lymphs Abs: 1.8 10*3/uL (ref 0.7–4.0)
MCV: 91.6 fL (ref 78.0–100.0)
Neutro Abs: 5.3 10*3/uL (ref 1.7–7.7)
Platelets: 216 10*3/uL (ref 150–400)
RBC: 4.19 MIL/uL (ref 3.87–5.11)
RDW: 14.2 % (ref 11.5–15.5)
WBC: 8 10*3/uL (ref 4.0–10.5)

## 2012-10-31 LAB — BASIC METABOLIC PANEL
CO2: 28 mEq/L (ref 19–32)
Calcium: 9.1 mg/dL (ref 8.4–10.5)
Chloride: 101 mEq/L (ref 96–112)
Glucose, Bld: 98 mg/dL (ref 70–99)
Sodium: 139 mEq/L (ref 135–145)

## 2012-10-31 NOTE — ED Notes (Signed)
Pt c/o tingling sensation in bilateral feet and hands since yesterday. Pt denies any other complaints. No bowel/bladder changes, no pain. Pt is ambulatory

## 2012-10-31 NOTE — ED Provider Notes (Signed)
CSN: [id number]     Arrival date & time 10/31/12  1711 History  This chart was scribed for non-physician practitioner, Junious Silk PA-C,  working with Audree Camel, MD by Arlan Organ, ED Scribe. This patient was seen in room TR09C/TR09C and the patient's care was started at 1740.  Chief Complaint  Patient presents with  . Numbness    The history is provided by the patient. No language interpreter was used.   HPI Comments: Melissa Kim is a 57 y.o. female with a hx of HTN and asthma who presents to the Emergency Department complaining of numbness in her fingers with radiation up both her arms and her feet with radiation up bother her legs. The tingling and numbness started yesterday when she woke up. Pt states nothing makes the numbness better or worse. Pt denies previous history of numbness. She recently started an inhaler prescribed by her PCP. This is the only change in medication. She was recently at her PCP and is not diabetic. A1c from 1 year ago was 5.7.   Past Medical History  Diagnosis Date  . Asthma   . Obesity   . Psoriasis   . Venous stasis dermatitis   . GERD (gastroesophageal reflux disease)   . Hypertension   . Fx foot bone NEC-closed   . Diverticulosis   . History of colonic diverticulitis   . Cancer    Past Surgical History  Procedure Laterality Date  . Appendectomy    . Cholecystectomy    . Abdominal hysterectomy    . Tonsikl    . Tonsillectomy    . Arthroscopic of knee     Family History  Problem Relation Age of Onset  . Obesity    . Hypertension Mother   . Heart disease Father   . Stroke Father    History  Substance Use Topics  . Smoking status: Never Smoker   . Smokeless tobacco: Not on file  . Alcohol Use: No   OB History   Grav Para Term Preterm Abortions TAB SAB Ect Mult Living                 Review of Systems  Constitutional: Negative for fever and chills.  Respiratory: Negative for shortness of breath.   Cardiovascular:  Negative for chest pain.  Gastrointestinal: Negative for nausea, vomiting and abdominal pain.  Neurological: Positive for numbness. Negative for weakness.  All other systems reviewed and are negative.    Allergies  Aspirin; Ibuprofen; Losartan potassium-hctz; Peg 3350-electrolytes; and Strawberry flavor  Home Medications   Current Outpatient Rx  Name  Route  Sig  Dispense  Refill  . albuterol (PROVENTIL HFA;VENTOLIN HFA) 108 (90 BASE) MCG/ACT inhaler   Inhalation   Inhale 2 puffs into the lungs every 6 (six) hours as needed. For shortness of breath          . b complex vitamins tablet   Oral   Take 1 tablet by mouth daily.           . Calcium 600-200 MG-UNIT per tablet   Oral   Take 1 tablet by mouth daily.           . Cholecalciferol (VITAMIN D HIGH POTENCY) 1000 UNITS capsule   Oral   Take 1,000 Units by mouth daily.           . enalapril (VASOTEC) 10 MG tablet      TAKE ONE TABLET BY MOUTH EVERY DAY   30 tablet  6   . Fluticasone-Salmeterol (ADVAIR DISKUS) 250-50 MCG/DOSE AEPB   Inhalation   Inhale 1 puff into the lungs every 12 (twelve) hours.           . montelukast (SINGULAIR) 10 MG tablet   Oral   Take 10 mg by mouth at bedtime.           . multivitamin (THERAGRAN) per tablet   Oral   Take 1 tablet by mouth daily.           . solifenacin (VESICARE) 5 MG tablet   Oral   Take 10 mg by mouth daily.           . traMADol (ULTRAM) 50 MG tablet   Oral   Take 50 mg by mouth 2 (two) times daily as needed. For pain Maximum dose= 8 tablets per day           BP 181/79  Pulse 77  Temp(Src) 98.3 F (36.8 C) (Oral)  Resp 18  SpO2 97% Physical Exam  Nursing note and vitals reviewed. Constitutional: She is oriented to person, place, and time. She appears well-developed and well-nourished. No distress.  HENT:  Head: Normocephalic and atraumatic.  Right Ear: External ear normal.  Left Ear: External ear normal.  Nose: Nose normal.   Mouth/Throat: Oropharynx is clear and moist.  Eyes: Conjunctivae are normal.  Neck: Normal range of motion.  Cardiovascular: Normal rate, regular rhythm and normal heart sounds.   Cap refill less than 3 seconds  Pulmonary/Chest: Effort normal and breath sounds normal. No stridor. No respiratory distress. She has no wheezes. She has no rales.  Abdominal: Soft. She exhibits no distension.  Musculoskeletal: Normal range of motion.  Neurological: She is alert and oriented to person, place, and time. She has normal strength.  Can differentiate between sharp and dull touch in toes, but not in fingers Strength normal  Skin: Skin is warm and dry. She is not diaphoretic. No erythema.  Psychiatric: She has a normal mood and affect. Her behavior is normal.    ED Course  Procedures (including critical care time)  DIAGNOSTIC STUDIES: Oxygen Saturation is 96% on RA, Normal by my interpretation.    COORDINATION OF CARE: 5:45 PM-Discussed treatment plan which includes speaking with attending physician. Pt agreed to plan.     Labs Review Labs Reviewed  CBC WITH DIFFERENTIAL  BASIC METABOLIC PANEL   Imaging Review No results found.  MDM   1. Numbness and tingling    Patient presents with numbness and tingling since yesterday morning. It is bilateral in both her hands and feet. Strength normal. Labs are unremarkable. No red flags with numbness. No indication for imaging at this time. Discussed importance of follow up with PCP. Discussed case with Dr. Criss Alvine who agrees with plan. Return instructions given. Vital signs stable for discharge. Patient / Family / Caregiver informed of clinical course, understand medical decision-making process, and agree with plan.   I personally performed the services described in this documentation, which was scribed in my presence. The recorded information has been reviewed and is accurate.     Mora Bellman, PA-C 10/31/12 2315

## 2012-11-01 NOTE — ED Provider Notes (Signed)
Medical screening examination/treatment/procedure(s) were performed by non-physician practitioner and as supervising physician I was immediately available for consultation/collaboration.   Audree Camel, MD 11/01/12 [phone]

## 2012-11-02 ENCOUNTER — Ambulatory Visit (INDEPENDENT_AMBULATORY_CARE_PROVIDER_SITE_OTHER): Payer: BC Managed Care – PPO | Admitting: Family Medicine

## 2012-11-02 VITALS — BP 140/80 | Temp 98.3°F | Wt 295.0 lb

## 2012-11-02 DIAGNOSIS — R209 Unspecified disturbances of skin sensation: Secondary | ICD-10-CM

## 2012-11-02 DIAGNOSIS — R202 Paresthesia of skin: Secondary | ICD-10-CM

## 2012-11-02 DIAGNOSIS — Z23 Encounter for immunization: Secondary | ICD-10-CM

## 2012-11-02 NOTE — Progress Notes (Signed)
Chief Complaint  Patient presents with  . Hospitalization Follow-up    HPI:  Melissa Kim is a 57 yo patient of Dr. Lovell Sheehan followed closely by him for her chronic medication problems here for an ED follow up for paresthesias: -seen in ED 9/11 for mild tingling sensation in fingers, toes, bilat extremities -reports she actually has had tingling in feet for years intermittently -was better today but usually gets worse with activity -no fevers, HA, weakness, chills, dysuria, polyuria, polydipsia, palpitations, depression, CP, bleeding, bowel or bladder incontinence, falls, dizziness, vision changes, muscle or joint pains that are new -she does have asthma and report chronic issues with this - but unchanged -reviewed ED notes, labs ok - she reports these were fasting -she had a hemoglobin A1c in October and was in low prediabetic range then -she doesn't t -she reports she takes vit B 12, she also take B complex   ROS: See pertinent positives and negatives per HPI.  Past Medical History  Diagnosis Date  . Asthma   . Obesity   . Psoriasis   . Venous stasis dermatitis   . GERD (gastroesophageal reflux disease)   . Hypertension   . Fx foot bone NEC-closed   . Diverticulosis   . History of colonic diverticulitis   . Cancer     Past Surgical History  Procedure Laterality Date  . Appendectomy    . Cholecystectomy    . Abdominal hysterectomy    . Tonsikl    . Tonsillectomy    . Arthroscopic of knee      Family History  Problem Relation Age of Onset  . Obesity    . Hypertension Mother   . Heart disease Father   . Stroke Father     History   Social History  . Marital Status: Married    Spouse Name: N/A    Number of Children: N/A  . Years of Education: N/A   Social History Main Topics  . Smoking status: Never Smoker   . Smokeless tobacco: Not on file  . Alcohol Use: No  . Drug Use: No  . Sexual Activity: Not on file   Other Topics Concern  . Not on file    Social History Narrative  . No narrative on file    EXAM:  Filed Vitals:   11/02/12 1545  BP: 140/80  Temp: 98.3 F (36.8 C)    Body mass index is 59.55 kg/(m^2).  GENERAL: vitals reviewed and listed above, alert, oriented, appears well hydrated and in no acute distress  HEENT: atraumatic, conjunttiva clear, no obvious abnormalities on inspection of external nose and ears  NECK: no obvious masses on inspection  LUNGS: clear to auscultation bilaterally, no wheezes, rales or rhonchi, good air movement  CV: HRRR, no peripheral edema  MS: moves all extremities without noticeable abnormality  PSYCH: pleasant and cooperative, no obvious depression or anxiety  NEURO: gait impacted by morbid obesity but otherwise ok, normal strength and sensation throughout UEs and LEs, PERRLA, CN II-XII grossly intact, DTRs normal, no clonus, finger to nose normal  ASSESSMENT AND PLAN:  Discussed the following assessment and plan:  Paresthesias - Plan: Hemoglobin A1c, Vitamin B12, Folate, TSH, T4, Free  Need for prophylactic vaccination and inoculation against influenza - Plan: Flu Vaccine QUAD 36+ mos PF IM (Fluarix)  -normal neuro exam and symptoms improving -will check B12, folate, hgba1c, TSH (wa prediabetic 1 year ago) -offered neurology referral, but she will hold off until sees PCP -follow up  with PCP next available appt to review labs, explore further if needed in 3-4 weeks -of course in meantime if worsening of symptoms or new symptoms she will let us know -Patient advised to return or notify a doctor immediately if symptoms worsen or persist or new concerns arise.  There are no Patient Instructions on file for this visit.   Kriste Basque R.

## 2012-11-03 LAB — HEMOGLOBIN A1C: Hgb A1c MFr Bld: 6 % (ref 4.6–6.5)

## 2012-11-03 LAB — T4, FREE: Free T4: 0.93 ng/dL (ref 0.60–1.60)

## 2012-11-03 LAB — FOLATE: Folate: 21.4 ng/mL (ref >5.9)

## 2012-11-03 LAB — VITAMIN B12: Vitamin B-12: 160 pg/mL — ABNORMAL LOW (ref 211–911)

## 2012-11-03 LAB — TSH: TSH: 2.54 u[IU]/mL (ref 0.35–5.50)

## 2012-11-05 NOTE — Progress Notes (Signed)
Quick Note:  Left a message for return call. ______ 

## 2012-11-06 NOTE — Progress Notes (Signed)
Quick Note:  Left a message for return call. ______ 

## 2012-11-08 ENCOUNTER — Encounter: Payer: Self-pay | Admitting: Internal Medicine

## 2012-11-08 NOTE — Progress Notes (Signed)
Quick Note:  Spoke with pt and pt is aware. ______ 
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
## 2012-12-20 ENCOUNTER — Ambulatory Visit (INDEPENDENT_AMBULATORY_CARE_PROVIDER_SITE_OTHER): Payer: BC Managed Care – PPO | Admitting: Internal Medicine

## 2012-12-20 ENCOUNTER — Encounter: Payer: Self-pay | Admitting: Internal Medicine

## 2012-12-20 VITALS — BP 160/86 | HR 80 | Temp 98.2°F | Resp 18 | Ht 59.0 in | Wt 302.0 lb

## 2012-12-20 DIAGNOSIS — I10 Essential (primary) hypertension: Secondary | ICD-10-CM

## 2012-12-20 DIAGNOSIS — R7309 Other abnormal glucose: Secondary | ICD-10-CM

## 2012-12-20 DIAGNOSIS — R739 Hyperglycemia, unspecified: Secondary | ICD-10-CM

## 2012-12-20 LAB — BASIC METABOLIC PANEL
CO2: 27 mEq/L (ref 19–32)
Calcium: 9.1 mg/dL (ref 8.4–10.5)
Creatinine, Ser: 0.6 mg/dL (ref 0.4–1.2)
GFR: 120.11 mL/min (ref >60.00)
Sodium: 141 mEq/L (ref 135–145)

## 2012-12-20 LAB — HEMOGLOBIN A1C: Hgb A1c MFr Bld: 5.9 % (ref 4.6–6.5)

## 2012-12-20 MED ORDER — ENALAPRIL MALEATE 10 MG PO TABS: 10.0000 mg | ORAL_TABLET | Freq: Two times a day (BID) | ORAL | Status: DC

## 2012-12-20 NOTE — Patient Instructions (Signed)
Increase the Vasotec to twice daily

## 2012-12-20 NOTE — Progress Notes (Signed)
  Subjective:    Patient ID: Melissa Kim, female    DOB: 08/06/1955, 57 y.o.   MRN: [id number]  HPI Increased consumption.  Weight is up over 10 pounds blood pressure is also up Comorbid findings of asthma and GERD also worsened   Review of Systems  Constitutional: Positive for fatigue. Negative for activity change and appetite change.       Morbid obesity  HENT: Positive for sinus pressure. Negative for congestion, ear pain and postnasal drip.   Eyes: Negative for redness and visual disturbance.  Respiratory: Positive for shortness of breath. Negative for cough and wheezing.   Gastrointestinal: Positive for abdominal distention. Negative for abdominal pain.  Genitourinary: Negative for dysuria, frequency and menstrual problem.  Musculoskeletal: Positive for gait problem and joint swelling. Negative for arthralgias, myalgias and neck pain.  Skin: Negative for rash and wound.  Neurological: Negative for dizziness, weakness and headaches.  Hematological: Negative for adenopathy. Does not bruise/bleed easily.  Psychiatric/Behavioral: Negative for sleep disturbance and decreased concentration.       Objective:   Physical Exam  Constitutional: She is oriented to person, place, and time. She appears well-developed and well-nourished. No distress.  HENT:  Head: Normocephalic and atraumatic.  Eyes: Conjunctivae and EOM are normal. Pupils are equal, round, and reactive to light.  Neck: Normal range of motion. Neck supple. No JVD present. No tracheal deviation present. No thyromegaly present.  Cardiovascular: Normal rate, regular rhythm and intact distal pulses.   Murmur heard. Pulmonary/Chest: Effort normal and breath sounds normal. She has no wheezes. She exhibits no tenderness.  Abdominal: Soft. Bowel sounds are normal.  Musculoskeletal: Normal range of motion. She exhibits no edema and no tenderness.  Lymphadenopathy:    She has no cervical adenopathy.  Neurological: She is  alert and oriented to person, place, and time. She has normal reflexes. No cranial nerve deficit.  Skin: Skin is warm and dry. She is not diaphoretic.  Psychiatric: She has a normal mood and affect. Her behavior is normal.          Assessment & Plan:  Morbid obesity with high DM risk Screen for DM urge weight loss Stress incontinence and GERD worsened with weight gain

## 2012-12-28 ENCOUNTER — Other Ambulatory Visit: Payer: Self-pay | Admitting: *Deleted

## 2012-12-28 DIAGNOSIS — I10 Essential (primary) hypertension: Secondary | ICD-10-CM

## 2012-12-28 MED ORDER — ENALAPRIL MALEATE 10 MG PO TABS: 10.0000 mg | ORAL_TABLET | Freq: Two times a day (BID) | ORAL | Status: DC

## 2013-03-15 ENCOUNTER — Encounter: Payer: Self-pay | Admitting: Gastroenterology

## 2013-04-19 ENCOUNTER — Other Ambulatory Visit: Payer: Self-pay

## 2013-04-19 ENCOUNTER — Encounter (HOSPITAL_COMMUNITY): Payer: Self-pay | Admitting: Emergency Medicine

## 2013-04-19 ENCOUNTER — Emergency Department (HOSPITAL_COMMUNITY): Payer: BC Managed Care – PPO

## 2013-04-19 ENCOUNTER — Emergency Department (HOSPITAL_COMMUNITY)
Admission: EM | Admit: 2013-04-19 | Discharge: 2013-04-20 | Disposition: A | Discharge status: Home / Self Care | Payer: BC Managed Care – PPO | Attending: Emergency Medicine | Admitting: Emergency Medicine

## 2013-04-19 DIAGNOSIS — Z872 Personal history of diseases of the skin and subcutaneous tissue: Secondary | ICD-10-CM | POA: Insufficient documentation

## 2013-04-19 DIAGNOSIS — I10 Essential (primary) hypertension: Secondary | ICD-10-CM | POA: Insufficient documentation

## 2013-04-19 DIAGNOSIS — IMO0002 Reserved for concepts with insufficient information to code with codable children: Secondary | ICD-10-CM | POA: Insufficient documentation

## 2013-04-19 DIAGNOSIS — Z8781 Personal history of (healed) traumatic fracture: Secondary | ICD-10-CM | POA: Insufficient documentation

## 2013-04-19 DIAGNOSIS — J45909 Unspecified asthma, uncomplicated: Secondary | ICD-10-CM | POA: Insufficient documentation

## 2013-04-19 DIAGNOSIS — Z79899 Other long term (current) drug therapy: Secondary | ICD-10-CM | POA: Insufficient documentation

## 2013-04-19 DIAGNOSIS — Z859 Personal history of malignant neoplasm, unspecified: Secondary | ICD-10-CM | POA: Insufficient documentation

## 2013-04-19 DIAGNOSIS — Z8719 Personal history of other diseases of the digestive system: Secondary | ICD-10-CM | POA: Insufficient documentation

## 2013-04-19 DIAGNOSIS — J209 Acute bronchitis, unspecified: Secondary | ICD-10-CM | POA: Insufficient documentation

## 2013-04-19 DIAGNOSIS — E669 Obesity, unspecified: Secondary | ICD-10-CM | POA: Insufficient documentation

## 2013-04-19 LAB — I-STAT TROPONIN, ED: Troponin i, poc: 0 ng/mL (ref 0.00–0.08)

## 2013-04-19 LAB — BASIC METABOLIC PANEL
BUN: 10 mg/dL (ref 6–23)
CALCIUM: 9.3 mg/dL (ref 8.4–10.5)
CO2: 27 mEq/L (ref 19–32)
Chloride: 100 mEq/L (ref 96–112)
Creatinine, Ser: 0.51 mg/dL (ref 0.50–1.10)
GFR calc Af Amer: >90 mL/min (ref >90)
GLUCOSE: 96 mg/dL (ref 70–99)
POTASSIUM: 3.4 meq/L — AB (ref 3.7–5.3)
Sodium: 142 mEq/L (ref 137–147)

## 2013-04-19 LAB — CBC
HEMATOCRIT: 42.7 % (ref 36.0–46.0)
HEMOGLOBIN: 13.9 g/dL (ref 12.0–15.0)
MCH: 29.4 pg (ref 26.0–34.0)
MCHC: 32.6 g/dL (ref 30.0–36.0)
MCV: 90.3 fL (ref 78.0–100.0)
Platelets: 182 10*3/uL (ref 150–400)
RBC: 4.73 MIL/uL (ref 3.87–5.11)
RDW: 14.4 % (ref 11.5–15.5)
WBC: 3 10*3/uL — ABNORMAL LOW (ref 4.0–10.5)

## 2013-04-19 MED ORDER — IPRATROPIUM-ALBUTEROL 0.5-2.5 (3) MG/3ML IN SOLN
3.0000 mL | Freq: Once | RESPIRATORY_TRACT | Status: AC
  Administered 2013-04-19: 3 mL via RESPIRATORY_TRACT
  Filled 2013-04-19: qty 3

## 2013-04-19 NOTE — ED Notes (Signed)
Pt states she has been having a cold with bad cough for 5 weeks.  Pt states tonight it feels like she has an elephant sitting on her chest.

## 2013-04-20 MED ORDER — FUROSEMIDE 20 MG PO TABS: 20.0000 mg | ORAL_TABLET | Freq: Every day | ORAL | Status: DC

## 2013-04-20 MED ORDER — AZITHROMYCIN 250 MG PO TABS: ORAL_TABLET | ORAL | Status: DC

## 2013-04-20 NOTE — Discharge Instructions (Signed)
Zithromax and Lasix as prescribed.  Return to the emergency department if you develop severe chest pain, difficulty breathing, or other new or concerning symptoms. You should followup with your primary Dr. in the next week to be rechecked.   Acute Bronchitis Bronchitis is inflammation of the airways that extend from the windpipe into the lungs (bronchi). The inflammation often causes mucus to develop. This leads to a cough, which is the most common symptom of bronchitis.  In acute bronchitis, the condition usually develops suddenly and goes away over time, usually in a couple weeks. Smoking, allergies, and asthma can make bronchitis worse. Repeated episodes of bronchitis may cause further lung problems.  CAUSES Acute bronchitis is most often caused by the same virus that causes a cold. The virus can spread from person to person (contagious).  SIGNS AND SYMPTOMS   Cough.   Fever.   Coughing up mucus.   Body aches.   Chest congestion.   Chills.   Shortness of breath.   Sore throat.  DIAGNOSIS  Acute bronchitis is usually diagnosed through a physical exam. Tests, such as chest X-rays, are sometimes done to rule out other conditions.  TREATMENT  Acute bronchitis usually goes away in a couple weeks. Often times, no medical treatment is necessary. Medicines are sometimes given for relief of fever or cough. Antibiotics are usually not needed but may be prescribed in certain situations. In some cases, an inhaler may be recommended to help reduce shortness of breath and control the cough. A cool mist vaporizer may also be used to help thin bronchial secretions and make it easier to clear the chest.  HOME CARE INSTRUCTIONS  Get plenty of rest.   Drink enough fluids to keep your urine clear or pale yellow (unless you have a medical condition that requires fluid restriction). Increasing fluids may help thin your secretions and will prevent dehydration.   Only take over-the-counter  or prescription medicines as directed by your health care provider.   Avoid smoking and secondhand smoke. Exposure to cigarette smoke or irritating chemicals will make bronchitis worse. If you are a smoker, consider using nicotine gum or skin patches to help control withdrawal symptoms. Quitting smoking will help your lungs heal faster.   Reduce the chances of another bout of acute bronchitis by washing your hands frequently, avoiding people with cold symptoms, and trying not to touch your hands to your mouth, nose, or eyes.   Follow up with your health care provider as directed.  SEEK MEDICAL CARE IF: Your symptoms do not improve after 1 week of treatment.  SEEK IMMEDIATE MEDICAL CARE IF:  You develop an increased fever or chills.   You have chest pain.   You have severe shortness of breath.  You have bloody sputum.   You develop dehydration.  You develop fainting.  You develop repeated vomiting.  You develop a severe headache. MAKE SURE YOU:   Understand these instructions.  Will watch your condition.  Will get help right away if you are not doing well or get worse. Document Released: 03/13/2004 Document Revised: 10/06/2012 Document Reviewed: 07/27/2012 [facility] Patient Information 2014 [place].

## 2013-04-20 NOTE — ED Provider Notes (Signed)
CSN: [id number]     Arrival date & time 04/19/13  1927 History   First MD Initiated Contact with Patient 04/20/13 0009     Chief Complaint  Patient presents with  . Chest Pain  . Cough     (Consider location/radiation/quality/duration/timing/severity/associated sxs/prior Treatment) HPI Comments: and the patient is a 58 year old female with history of obesity and asthma. She presents today with complaints of chest congestion and cough for the past 5 weeks. She has no prior cardiac history. She feels somewhat tight in her chest this evening, but otherwise denies chest pain.  Patient is a 58 y.o. female presenting with cough. The history is provided by the patient.  Cough Cough characteristics:  Productive Sputum characteristics:  White Severity:  Moderate Onset quality:  Gradual Duration:  5 weeks Timing:  Constant Progression:  Worsening Chronicity:  New Smoker: no   Relieved by:  Nothing Worsened by:  Nothing tried Ineffective treatments:  None tried Associated symptoms: chills and fever   Associated symptoms: no chest pain     Past Medical History  Diagnosis Date  . Asthma   . Obesity   . Psoriasis   . Venous stasis dermatitis   . GERD (gastroesophageal reflux disease)   . Hypertension   . Fx foot bone NEC-closed   . Diverticulosis   . History of colonic diverticulitis   . Cancer    Past Surgical History  Procedure Laterality Date  . Appendectomy    . Cholecystectomy    . Abdominal hysterectomy    . Tonsikl    . Tonsillectomy    . Arthroscopic of knee     Family History  Problem Relation Age of Onset  . Obesity    . Hypertension Mother   . Heart disease Father   . Stroke Father    History  Substance Use Topics  . Smoking status: Never Smoker   . Smokeless tobacco: Not on file  . Alcohol Use: No   OB History   Grav Para Term Preterm Abortions TAB SAB Ect Mult Living                 Review of Systems  Constitutional: Positive for fever and chills.   Respiratory: Positive for cough.   Cardiovascular: Negative for chest pain.  All other systems reviewed and are negative.      Allergies  Aspirin; Ibuprofen; Losartan potassium-hctz; Peg 3350-electrolytes; and Strawberry flavor  Home Medications   Current Outpatient Rx  Name  Route  Sig  Dispense  Refill  . albuterol (PROVENTIL HFA;VENTOLIN HFA) 108 (90 BASE) MCG/ACT inhaler   Inhalation   Inhale 2 puffs into the lungs every 6 (six) hours as needed. For shortness of breath          . b complex vitamins tablet   Oral   Take 1 tablet by mouth daily.           . Calcium 600-200 MG-UNIT per tablet   Oral   Take 1 tablet by mouth daily.           . Cholecalciferol (VITAMIN D HIGH POTENCY) 1000 UNITS capsule   Oral   Take 1,000 Units by mouth daily.          . enalapril (VASOTEC) 10 MG tablet   Oral   Take 1 tablet (10 mg total) by mouth 2 (two) times daily.   60 tablet   6   . Fluticasone-Salmeterol (ADVAIR DISKUS) 250-50 MCG/DOSE AEPB   Inhalation  Inhale 1 puff into the lungs every 12 (twelve) hours.           . montelukast (SINGULAIR) 10 MG tablet   Oral   Take 10 mg by mouth at bedtime.           . multivitamin (THERAGRAN) per tablet   Oral   Take 1 tablet by mouth daily.          . solifenacin (VESICARE) 5 MG tablet   Oral   Take 10 mg by mouth daily.            BP 154/55  Pulse 75  Temp(Src) 98.1 F (36.7 C) (Oral)  Resp 16  Wt 301 lb (136.533 kg)  SpO2 99% Physical Exam  Nursing note and vitals reviewed. Constitutional: She is oriented to person, place, and time. She appears well-developed and well-nourished. No distress.  HENT:  Head: Normocephalic and atraumatic.  Mouth/Throat: Oropharynx is clear and moist.  Neck: Normal range of motion. Neck supple.  Cardiovascular: Normal rate and regular rhythm.  Exam reveals no gallop and no friction rub.   No murmur heard. Pulmonary/Chest: Effort normal and breath sounds normal. No  respiratory distress. She has no wheezes.  Abdominal: Soft. Bowel sounds are normal. She exhibits no distension. There is no tenderness.  Musculoskeletal: Normal range of motion.  Neurological: She is alert and oriented to person, place, and time.  Skin: Skin is warm and dry. She is not diaphoretic.    ED Course  Procedures (including critical care time) Labs Review Labs Reviewed  CBC - Abnormal; Notable for the following:    WBC 3.0 (*)    All other components within normal limits  BASIC METABOLIC PANEL - Abnormal; Notable for the following:    Potassium 3.4 (*)    All other components within normal limits  I-STAT TROPOININ, ED   Imaging Review Dg Chest 2 View  04/19/2013   CLINICAL DATA:  Chest pain and cough  EXAM: CHEST  2 VIEW  COMPARISON:  12/15/2010  FINDINGS: Mild cardiomegaly with chronic vascular congestion. No CHF. External artifact overlies the right chest. No effusion or pneumothorax. Degenerative changes of the spine. Prior cholecystectomy noted. Diffuse thoracic degenerative changes.  IMPRESSION: Cardiomegaly with vascular congestion.  Stable exam   Electronically Signed   By: Daryll Brod M.D.   On: 04/19/2013 20:39     Date: 04/20/2013  Rate: 60  Rhythm: normal sinus rhythm  QRS Axis: normal  Intervals: normal  ST/T Wave abnormalities: normal  Conduction Disutrbances:none  Narrative Interpretation:   Old EKG Reviewed: none available    MDM   Final diagnoses:  None    Patient presents with complaints of cough for the past 5 weeks. This has been productive of white sputum. She has felt fevered and chilled intermittently. Nothing in the workup indicates a cardiac etiology. Her EKG is normal and troponin is negative. She does have some vascular congestion on the chest x-ray but no infiltrate. For this reason I will prescribe a low dose of Lasix and also start an antibiotic. She is to followup with her primary Dr. Linna Hoff of the week for followup.    Veryl Speak,  MD 04/20/13 [phone]

## 2013-05-23 ENCOUNTER — Encounter: Payer: Self-pay | Admitting: Internal Medicine

## 2013-05-23 ENCOUNTER — Ambulatory Visit (INDEPENDENT_AMBULATORY_CARE_PROVIDER_SITE_OTHER): Payer: BC Managed Care – PPO | Admitting: Internal Medicine

## 2013-05-23 VITALS — BP 142/80 | HR 78 | Temp 97.6°F | Ht 59.0 in | Wt 294.0 lb

## 2013-05-23 DIAGNOSIS — I10 Essential (primary) hypertension: Secondary | ICD-10-CM

## 2013-05-23 DIAGNOSIS — J45909 Unspecified asthma, uncomplicated: Secondary | ICD-10-CM

## 2013-05-23 NOTE — Patient Instructions (Signed)
The patient is instructed to continue all medications as prescribed. Schedule followup with check out clerk upon leaving the clinic  

## 2013-05-23 NOTE — Progress Notes (Signed)
Subjective:    Patient ID: Melissa Kim, female    DOB: 11-03-1955, 58 y.o.   MRN: [id number]  HPI Asthma and bronchitis hx Morbid obesity  Exercise and weight loss are key   Review of Systems  Constitutional: Negative for activity change, appetite change and fatigue.  HENT: Positive for postnasal drip, rhinorrhea and sinus pressure. Negative for congestion and ear pain.   Eyes: Negative for redness and visual disturbance.  Respiratory: Positive for cough. Negative for shortness of breath and wheezing.   Gastrointestinal: Negative for abdominal pain and abdominal distention.  Genitourinary: Negative for dysuria, frequency and menstrual problem.  Musculoskeletal: Negative for arthralgias, joint swelling, myalgias and neck pain.  Skin: Negative for rash and wound.  Neurological: Negative for dizziness, weakness and headaches.  Hematological: Negative for adenopathy. Does not bruise/bleed easily.  Psychiatric/Behavioral: Negative for sleep disturbance and decreased concentration.   Past Medical History  Diagnosis Date  . Asthma   . Obesity   . Psoriasis   . Venous stasis dermatitis   . GERD (gastroesophageal reflux disease)   . Hypertension   . Fx foot bone NEC-closed   . Diverticulosis   . History of colonic diverticulitis   . Cancer     History   Social History  . Marital Status: Married    Spouse Name: N/A    Number of Children: N/A  . Years of Education: N/A   Occupational History  . Not on file.   Social History Main Topics  . Smoking status: Never Smoker   . Smokeless tobacco: Not on file  . Alcohol Use: No  . Drug Use: No  . Sexual Activity: Not on file   Other Topics Concern  . Not on file   Social History Narrative  . No narrative on file    Past Surgical History  Procedure Laterality Date  . Appendectomy    . Cholecystectomy    . Abdominal hysterectomy    . Tonsikl    . Tonsillectomy    . Arthroscopic of knee      Family History    Problem Relation Age of Onset  . Obesity    . Hypertension Mother   . Heart disease Father   . Stroke Father     Allergies  Allergen Reactions  . Aspirin     REACTION: Gi upset  . Ibuprofen     REACTION: GI upset  . Losartan Potassium-Hctz     REACTION: itching  . Peg 3350-Electrolytes     REACTION: vomiting  . Strawberry     Current Outpatient Prescriptions on File Prior to Visit  Medication Sig Dispense Refill  . albuterol (PROVENTIL HFA;VENTOLIN HFA) 108 (90 BASE) MCG/ACT inhaler Inhale 2 puffs into the lungs every 6 (six) hours as needed. For shortness of breath       . azithromycin (ZITHROMAX Z-PAK) 250 MG tablet 2 po day one, then 1 daily x 4 days  6 tablet  0  . b complex vitamins tablet Take 1 tablet by mouth daily.        . Calcium 600-200 MG-UNIT per tablet Take 1 tablet by mouth daily.        . Cholecalciferol (VITAMIN D HIGH POTENCY) 1000 UNITS capsule Take 1,000 Units by mouth daily.       . enalapril (VASOTEC) 10 MG tablet Take 1 tablet (10 mg total) by mouth 2 (two) times daily.  60 tablet  6  . Fluticasone-Salmeterol (ADVAIR DISKUS) 250-50 MCG/DOSE AEPB Inhale  1 puff into the lungs every 12 (twelve) hours.        . furosemide (LASIX) 20 MG tablet Take 1 tablet (20 mg total) by mouth daily.  15 tablet  0  . montelukast (SINGULAIR) 10 MG tablet Take 10 mg by mouth at bedtime.        . multivitamin (THERAGRAN) per tablet Take 1 tablet by mouth daily.       . solifenacin (VESICARE) 5 MG tablet Take 10 mg by mouth daily.         No current facility-administered medications on file prior to visit.    BP 142/80  Pulse 78  Temp(Src) 97.6 F (36.4 C) (Oral)  Ht 4\' 11"  (1.499 m)  Wt 294 lb (133.358 kg)  BMI 59.35 kg/m2  SpO2 95%       Objective:   Physical Exam  HENT:  Head: Normocephalic and atraumatic.  Inflamed sinuses  Cardiovascular: Normal rate and regular rhythm.   Pulmonary/Chest: Effort normal and breath sounds normal.  Musculoskeletal: She  exhibits edema and tenderness.  Neurological: She is alert. She displays abnormal reflex.   Severe DJD of knees Had TAH in 1999       Assessment & Plan:  Asthma stable Severe DJD of the knees and weigth loss needed for obesity mammogram Overactive  baldder and has not been on the vesicar  Stable use of inhalers  weigth reduction KEY

## 2013-05-23 NOTE — Progress Notes (Signed)
Pre visit review using our clinic review tool, if applicable. No additional management support is needed unless otherwise documented below in the visit note. 

## 2013-05-24 ENCOUNTER — Telehealth: Payer: Self-pay | Admitting: Internal Medicine

## 2013-05-24 NOTE — Telephone Encounter (Signed)
Relevant patient education mailed to patient.  

## 2013-10-18 ENCOUNTER — Encounter (HOSPITAL_COMMUNITY): Payer: Self-pay | Admitting: Emergency Medicine

## 2013-10-18 ENCOUNTER — Emergency Department (HOSPITAL_COMMUNITY)
Admission: EM | Admit: 2013-10-18 | Discharge: 2013-10-18 | Disposition: A | Discharge status: Home / Self Care | Payer: BC Managed Care – PPO | Attending: Emergency Medicine | Admitting: Emergency Medicine

## 2013-10-18 DIAGNOSIS — L84 Corns and callosities: Secondary | ICD-10-CM | POA: Diagnosis present

## 2013-10-18 DIAGNOSIS — Z8719 Personal history of other diseases of the digestive system: Secondary | ICD-10-CM | POA: Insufficient documentation

## 2013-10-18 DIAGNOSIS — J45909 Unspecified asthma, uncomplicated: Secondary | ICD-10-CM | POA: Diagnosis not present

## 2013-10-18 DIAGNOSIS — Z79899 Other long term (current) drug therapy: Secondary | ICD-10-CM | POA: Insufficient documentation

## 2013-10-18 DIAGNOSIS — Z792 Long term (current) use of antibiotics: Secondary | ICD-10-CM | POA: Insufficient documentation

## 2013-10-18 DIAGNOSIS — IMO0001 Reserved for inherently not codable concepts without codable children: Secondary | ICD-10-CM | POA: Diagnosis not present

## 2013-10-18 DIAGNOSIS — E669 Obesity, unspecified: Secondary | ICD-10-CM | POA: Diagnosis not present

## 2013-10-18 DIAGNOSIS — Z862 Personal history of diseases of the blood and blood-forming organs and certain disorders involving the immune mechanism: Secondary | ICD-10-CM | POA: Diagnosis not present

## 2013-10-18 DIAGNOSIS — IMO0002 Reserved for concepts with insufficient information to code with codable children: Secondary | ICD-10-CM | POA: Diagnosis not present

## 2013-10-18 DIAGNOSIS — Z8781 Personal history of (healed) traumatic fracture: Secondary | ICD-10-CM | POA: Insufficient documentation

## 2013-10-18 DIAGNOSIS — I10 Essential (primary) hypertension: Secondary | ICD-10-CM | POA: Insufficient documentation

## 2013-10-18 DIAGNOSIS — Z859 Personal history of malignant neoplasm, unspecified: Secondary | ICD-10-CM | POA: Insufficient documentation

## 2013-10-18 DIAGNOSIS — L98491 Non-pressure chronic ulcer of skin of other sites limited to breakdown of skin: Secondary | ICD-10-CM

## 2013-10-18 MED ORDER — DOXYCYCLINE HYCLATE 100 MG PO CAPS: 100.0000 mg | ORAL_CAPSULE | Freq: Two times a day (BID) | ORAL | Status: DC

## 2013-10-18 MED ORDER — TRAMADOL HCL 50 MG PO TABS: 50.0000 mg | ORAL_TABLET | Freq: Four times a day (QID) | ORAL | Status: DC | PRN

## 2013-10-18 NOTE — ED Provider Notes (Signed)
CSN: [id number]     Arrival date & time 10/18/13  2210 History  This chart was scribed for non-physician practitioner, Domenic Moras, PA-C working with Orlie Dakin, MD by Einar Pheasant, ED scribe. This patient was seen in room TR10C/TR10C and the patient's care was started at 10:38 PM.     Chief Complaint  Patient presents with  . Callouses   The history is provided by the patient. No language interpreter was used.   HPI Comments: Melissa Kim is a 58 y.o. female with hx of several stress fracture to the right foot, presents to the Emergency Department complaining of worsening right foot pain that started 1 week ago. She describes the pain as "shooting throbbing" in nature. Pt has callous to the bottom of her right foot where the pain is severe.  Denies any recent injuries, falls, hx DM, fever, or chills. She walks on hard surface regularly at work.  She is morbidly obese.      Past Medical History  Diagnosis Date  . Asthma   . Obesity   . Psoriasis   . Venous stasis dermatitis   . GERD (gastroesophageal reflux disease)   . Hypertension   . Fx foot bone NEC-closed   . Diverticulosis   . History of colonic diverticulitis   . Cancer    Past Surgical History  Procedure Laterality Date  . Appendectomy    . Cholecystectomy    . Abdominal hysterectomy    . Tonsikl    . Tonsillectomy    . Arthroscopic of knee     Family History  Problem Relation Age of Onset  . Obesity    . Hypertension Mother   . Heart disease Father   . Stroke Father    History  Substance Use Topics  . Smoking status: Never Smoker   . Smokeless tobacco: Not on file  . Alcohol Use: No   OB History   Grav Para Term Preterm Abortions TAB SAB Ect Mult Living                 Review of Systems  Constitutional: Negative for fever and chills.  HENT: Negative for congestion.   Respiratory: Negative for cough and shortness of breath.   Cardiovascular: Negative for chest pain.  Musculoskeletal:  Positive for myalgias.  Skin: Positive for wound.  Neurological: Negative for weakness and numbness.   Allergies  Aspirin; Ibuprofen; Losartan potassium-hctz; Peg 3350-electrolytes; and Strawberry  Home Medications   Prior to Admission medications   Medication Sig Start Date End Date Taking? Authorizing Provider  albuterol (PROVENTIL HFA;VENTOLIN HFA) 108 (90 BASE) MCG/ACT inhaler Inhale 2 puffs into the lungs every 6 (six) hours as needed. For shortness of breath  06/25/10   Ricard Dillon, MD  azithromycin (ZITHROMAX Z-PAK) 250 MG tablet 2 po day one, then 1 daily x 4 days 04/20/13   Veryl Speak, MD  b complex vitamins tablet Take 1 tablet by mouth daily.      Historical Provider, MD  Calcium 600-200 MG-UNIT per tablet Take 1 tablet by mouth daily.      Historical Provider, MD  Cholecalciferol (VITAMIN D HIGH POTENCY) 1000 UNITS capsule Take 1,000 Units by mouth daily.     Historical Provider, MD  enalapril (VASOTEC) 10 MG tablet Take 1 tablet (10 mg total) by mouth 2 (two) times daily. 12/28/12   Ricard Dillon, MD  Fluticasone-Salmeterol (ADVAIR DISKUS) 250-50 MCG/DOSE AEPB Inhale 1 puff into the lungs every 12 (twelve) hours.  Historical Provider, MD  furosemide (LASIX) 20 MG tablet Take 1 tablet (20 mg total) by mouth daily. 04/20/13   Veryl Speak, MD  montelukast (SINGULAIR) 10 MG tablet Take 10 mg by mouth at bedtime.      Historical Provider, MD  multivitamin [facility]) per tablet Take 1 tablet by mouth daily.     Historical Provider, MD  solifenacin (VESICARE) 5 MG tablet Take 10 mg by mouth daily.      Historical Provider, MD   BP 166/63  Pulse 80  Temp(Src) 98.1 F (36.7 C) (Oral)  Resp 16  Ht 4\' 11"  (1.499 m)  Wt 280 lb (127.007 kg)  BMI 56.52 kg/m2  SpO2 95%  Physical Exam  Nursing note and vitals reviewed. Constitutional: She is oriented to person, place, and time. She appears well-developed and well-nourished. No distress.  HENT:  Head: Normocephalic and  atraumatic.  Eyes: Conjunctivae and EOM are normal.  Neck: Neck supple.  Cardiovascular: Normal rate.   Pulmonary/Chest: Effort normal. No respiratory distress.  Musculoskeletal: Normal range of motion.  Right foot: callous ulcer to the sole of the foot with exquisite tenderness and surrounding edema. No obvious abscess. No pustule drainage. No ankle involvement. Pedal pulse is palpable.  Neurological: She is alert and oriented to person, place, and time.  Skin: Skin is warm and dry.  Psychiatric: She has a normal mood and affect. Her behavior is normal.    ED Course  Procedures (including critical care time)  DIAGNOSTIC STUDIES: Oxygen Saturation is 95% on RA, low by my interpretation.    COORDINATION OF CARE: 10:44 PM- will prescribe antibiotics. Advised her to follow up with podiatrist. Also advised pt to soak foot in warm water with dial antibacterial soap several times a day and to keep the foot dry. Pt advised of plan for treatment and pt agrees.   Labs Review Labs Reviewed - No data to display  Imaging Review No results found.   EKG Interpretation None      MDM   Final diagnoses:  Callous ulcer, limited to breakdown of skin    BP 166/63  Pulse 80  Temp(Src) 98.1 F (36.7 C) (Oral)  Resp 16  Ht 4\' 11"  (1.499 m)  Wt 280 lb (127.007 kg)  BMI 56.52 kg/m2  SpO2 95%   I personally performed the services described in this documentation, which was scribed in my presence. The recorded information has been reviewed and is accurate.    Domenic Moras, PA-C 10/18/13 2248

## 2013-10-18 NOTE — Discharge Instructions (Signed)
Your right foot pain is likely related to a callous ulcer.  Take antibiotic as prescribed.  Soak foot in warm water with Dial antibacterial soap several times daily.  Follow up closely with podiatrist for further care.  Return if you have any concerns.  Follow instruction below.   Skin Ulcer A skin ulcer is an open sore that can be shallow or deep. Skin ulcers sometimes become infected and are difficult to treat. It may be 1 month or longer before real healing progress is made. CAUSES   Injury.  Problems with the veins or arteries.  Diabetes.  Insect bites.  Bedsores.  Inflammatory conditions. SYMPTOMS   Pain, redness, swelling, and tenderness around the ulcer.  Fever.  Bleeding from the ulcer.  Yellow or clear fluid coming from the ulcer. DIAGNOSIS  There are many types of skin ulcers. Any open sores will be examined. Certain tests will be done to determine the kind of ulcer you have. The right treatment depends on the type of ulcer you have. TREATMENT  Treatment is a long-term challenge. It may include:  Wearing an elastic wrap, compression stockings, or gel cast over the ulcer area.  Taking antibiotic medicines or putting antibiotic creams on the affected area if there is an infection. HOME CARE INSTRUCTIONS  Put on your bandages (dressings), wraps, or casts over the ulcer as directed by your caregiver.  Change all dressings as directed by your caregiver.  Take all medicines as directed by your caregiver.  Keep the affected area clean and dry.  Avoid injuries to the affected area.  Eat a well-balanced, healthy diet that includes plenty of fruit and vegetables.  If you smoke, consider quitting or decreasing the amount of cigarettes you smoke.  Once the ulcer heals, get regular exercise as directed by your caregiver.  Work with your caregiver to make sure your blood pressure, cholesterol, and diabetes are well-controlled.  Keep your skin moisturized. Dry skin  can crack and lead to skin ulcers. SEEK IMMEDIATE MEDICAL CARE IF:   Your pain gets worse.  You have swelling, redness, or fluids around the ulcer.  You have chills.  You have a fever. MAKE SURE YOU:   Understand these instructions.  Will watch your condition.  Will get help right away if you are not doing well or get worse. Document Released: 03/13/2004 Document Revised: 04/28/2011 Document Reviewed: 09/20/2010 [facility] Patient Information 2015 [place], [place]. This information is not intended to replace advice given to you by your health care provider. Make sure you discuss any questions you have with your health care provider.

## 2013-10-18 NOTE — ED Notes (Signed)
Bacitracin placed on callous on R foot and wrapped with kling

## 2013-10-18 NOTE — ED Notes (Signed)
Pt reports sore to right foot x 1 that is progressively hurting worse. Redness noted to site, painful to palpation. Denies fever/chills. Pt denies hx of diabetes.

## 2013-10-19 NOTE — ED Provider Notes (Signed)
Medical screening examination/treatment/procedure(s) were performed by non-physician practitioner and as supervising physician I was immediately available for consultation/collaboration.   EKG Interpretation None       Orlie Dakin, MD 10/19/13 0110

## 2014-03-09 LAB — HM MAMMOGRAPHY: HM MAMMO: NORMAL

## 2014-03-14 ENCOUNTER — Encounter: Payer: Self-pay | Admitting: Family Medicine

## 2014-03-28 ENCOUNTER — Ambulatory Visit (INDEPENDENT_AMBULATORY_CARE_PROVIDER_SITE_OTHER): Payer: BLUE CROSS/BLUE SHIELD | Admitting: Family Medicine

## 2014-03-28 ENCOUNTER — Encounter: Payer: Self-pay | Admitting: Family Medicine

## 2014-03-28 VITALS — BP 130/64 | Temp 98.0°F | Wt 297.0 lb

## 2014-03-28 DIAGNOSIS — M179 Osteoarthritis of knee, unspecified: Secondary | ICD-10-CM | POA: Insufficient documentation

## 2014-03-28 DIAGNOSIS — N393 Stress incontinence (female) (male): Secondary | ICD-10-CM

## 2014-03-28 DIAGNOSIS — R739 Hyperglycemia, unspecified: Secondary | ICD-10-CM

## 2014-03-28 DIAGNOSIS — Z8542 Personal history of malignant neoplasm of other parts of uterus: Secondary | ICD-10-CM | POA: Insufficient documentation

## 2014-03-28 DIAGNOSIS — E785 Hyperlipidemia, unspecified: Secondary | ICD-10-CM

## 2014-03-28 DIAGNOSIS — I10 Essential (primary) hypertension: Secondary | ICD-10-CM

## 2014-03-28 DIAGNOSIS — M1710 Unilateral primary osteoarthritis, unspecified knee: Secondary | ICD-10-CM | POA: Insufficient documentation

## 2014-03-28 DIAGNOSIS — M8580 Other specified disorders of bone density and structure, unspecified site: Secondary | ICD-10-CM

## 2014-03-28 DIAGNOSIS — J4520 Mild intermittent asthma, uncomplicated: Secondary | ICD-10-CM

## 2014-03-28 LAB — LIPID PANEL
Cholesterol: 195 mg/dL (ref 0–200)
HDL: 67.6 mg/dL (ref >39.00)
LDL CALC: 102 mg/dL — AB (ref 0–99)
NonHDL: 127.4
TRIGLYCERIDES: 127 mg/dL (ref 0.0–149.0)
Total CHOL/HDL Ratio: 3
VLDL: 25.4 mg/dL (ref 0.0–40.0)

## 2014-03-28 LAB — CBC
HEMATOCRIT: 38.8 % (ref 36.0–46.0)
Hemoglobin: 12.8 g/dL (ref 12.0–15.0)
MCHC: 32.9 g/dL (ref 30.0–36.0)
MCV: 87.4 fl (ref 78.0–100.0)
PLATELETS: 219 10*3/uL (ref 150.0–400.0)
RBC: 4.45 Mil/uL (ref 3.87–5.11)
RDW: 15.2 % (ref 11.5–15.5)
WBC: 7.3 10*3/uL (ref 4.0–10.5)

## 2014-03-28 LAB — COMPREHENSIVE METABOLIC PANEL
ALBUMIN: 3.7 g/dL (ref 3.5–5.2)
ALK PHOS: 96 U/L (ref 39–117)
ALT: 12 U/L (ref 0–35)
AST: 12 U/L (ref 0–37)
BILIRUBIN TOTAL: 0.4 mg/dL (ref 0.2–1.2)
BUN: 15 mg/dL (ref 6–23)
CO2: 32 mEq/L (ref 19–32)
CREATININE: 0.65 mg/dL (ref 0.40–1.20)
Calcium: 9.1 mg/dL (ref 8.4–10.5)
Chloride: 101 mEq/L (ref 96–112)
GFR: 98.62 mL/min (ref >60.00)
GLUCOSE: 112 mg/dL — AB (ref 70–99)
POTASSIUM: 4.6 meq/L (ref 3.5–5.1)
Sodium: 139 mEq/L (ref 135–145)
Total Protein: 7.1 g/dL (ref 6.0–8.3)

## 2014-03-28 LAB — TSH: TSH: 5.38 u[IU]/mL — ABNORMAL HIGH (ref 0.35–4.50)

## 2014-03-28 LAB — HEMOGLOBIN A1C: HEMOGLOBIN A1C: 5.9 % (ref 4.6–6.5)

## 2014-03-28 NOTE — Assessment & Plan Note (Signed)
Advised patient to restart her calcium/vitamin D. Last testing 2010 actually showed osteoporosis in 1 area. Would consider repeat dexa within a year, may need bisphosphonate.

## 2014-03-28 NOTE — Assessment & Plan Note (Signed)
With coughing/sneezing. No pharmacologic medications approved by FDA. She did not have improvement on vesicare by Dr. Arnoldo Morale. We will discontinue, if bothers patient consider urology referral for further assessment. Currently she will wear pads at times.

## 2014-03-28 NOTE — Assessment & Plan Note (Signed)
Enalapril 10mg  BID prior. Patient stopped but BP controlled. Due to recent change, i asked patient to follow up with me within 3 months to see if we need to restart.

## 2014-03-28 NOTE — Patient Instructions (Addendum)
Blood pressure looks ok not on medicine, would need to restart enalapril if elevated in the future.   I am very worried about your weight and long term effects on your health. I would recommend the folowing 1. Work on portion size as you suggested 2. Swap soda out for water  Consider weight watchers Strongly advise exercise such as water aerobics at [employer] about shingles vaccine/zostavax. We can write you a prescription if needed.   Update Labs today  See me in 3 months since you came off your medicines to make sure things are stable. I am hopeful you can lose at least 10 lbs in that time.

## 2014-03-28 NOTE — Assessment & Plan Note (Signed)
Advair, albuterol, singulair previously. Now taking only albuterol as rescue. Over 2-3 months has used albuterol only once. This seems like reasonable control of what would seem to be mild intermittent asthma. Follow up in 3 months and reassess-continue rescue only with sooner return precautions discussed.

## 2014-03-28 NOTE — Progress Notes (Signed)
Melissa Reddish, MD Phone: [phone]  Subjective:  Patient presents today to establish care with me as their new primary care provider. Patient was formerly a patient of Dr. Arnoldo Morale. Chief complaint-noted.   Hypertension-controlled not on medications  BP Readings from Last 3 Encounters:  03/28/14 130/64  10/18/13 166/63  05/23/13 142/80  Home BP monitoring-no Compliant with medications-stopped taking  ROS-Denies any CP, HA, SOB, blurry vision. Does have chronic LE edema  Asthma-reasonable control Last albuterol usage 2-3 months only had to use once. Much improved control even without adviar and singulair  Stress incontinence- mild poor control vesicare did not help much so she stopped. Worse when she was on a diuretic. 1-2x a week and not increasing frequency. No leg weaknss except due to pain.   Osteopenia-noted 2010, assume poor control as Not taking calcium/vit D  ROS- no shortness of breath or dyspnea on exertion granted due to knee pain has to walk ver slow. No fecal incontinence. No leg weakness.   The following were reviewed and entered/updated in epic: Past Medical History  Diagnosis Date  . Asthma   . Obesity   . Psoriasis   . Venous stasis dermatitis   . GERD (gastroesophageal reflux disease)   . Hypertension   . Fx foot bone NEC-closed   . Diverticulosis   . Cancer   . DIVERTICULOSIS, COLON 05/30/2008  . STRESS FRACTURE, FOOT 12/05/2008    reports multiple  . DIVERTICULITIS, HX OF 05/30/2008   Patient Active Problem List   Diagnosis Date Noted  . OBESITY, MORBID 11/30/2007    Priority: High  . Uterine cancer 03/28/2014    Priority: Medium  . Hyperglycemia 03/28/2014    Priority: Medium  . URINARY INCONTINENCE, STRESS, FEMALE 09/24/2009    Priority: Medium  . Osteopenia 12/05/2008    Priority: Medium  . Essential hypertension 06/01/2007    Priority: Medium  . Asthma 09/29/2006    Priority: Medium  . Psoriasis 09/29/2006    Priority: Medium  .  Osteoarthritis, knee 03/28/2014    Priority: Low  . Vitamin D deficiency 11/20/2009    Priority: Low  . Venous (peripheral) insufficiency 09/29/2006    Priority: Low  . GERD 09/29/2006    Priority: Low   Past Surgical History  Procedure Laterality Date  . Appendectomy    . Cholecystectomy    . Abdominal hysterectomy    . Tonsillectomy    . Arthroscopic of knee      Family History  Problem Relation Age of Onset  . Hypertension Mother   . Heart disease Father     MI in late 52s, early 78s, smoker  . Pancreatic cancer Mother     Medications- reviewed and updated Current Outpatient Prescriptions  Medication Sig Dispense Refill  . albuterol (PROVENTIL HFA;VENTOLIN HFA) 108 (90 BASE) MCG/ACT inhaler Inhale 2 puffs into the lungs every 6 (six) hours as needed. For shortness of breath     . Calcium 600-200 MG-UNIT per tablet Take 1 tablet by mouth daily.      . Cholecalciferol (VITAMIN D HIGH POTENCY) 1000 UNITS capsule Take 1,000 Units by mouth daily.     . enalapril (VASOTEC) 10 MG tablet Take 1 tablet (10 mg total) by mouth 2 (two) times daily. (Patient not taking: Reported on 03/28/2014) 60 tablet 6  . Fluticasone-Salmeterol (ADVAIR DISKUS) 250-50 MCG/DOSE AEPB Inhale 1 puff into the lungs every 12 (twelve) hours.      . montelukast (SINGULAIR) 10 MG tablet Take 10 mg by mouth  at bedtime.      . multivitamin (THERAGRAN) per tablet Take 1 tablet by mouth daily.     . solifenacin (VESICARE) 5 MG tablet Take 10 mg by mouth daily.      . traMADol (ULTRAM) 50 MG tablet Take 1 tablet (50 mg total) by mouth every 6 (six) hours as needed. (Patient not taking: Reported on 03/28/2014) 15 tablet 0   No current facility-administered medications for this visit.   Allergies-reviewed and updated Allergies  Allergen Reactions  . Aspirin     REACTION: Gi upset  . Ibuprofen     REACTION: GI upset  . Losartan Potassium-Hctz     REACTION: itching  . Peg 3350-Electrolytes     REACTION: vomiting   . Strawberry     History   Social History  . Marital Status: Married    Spouse Name: N/A    Number of Children: N/A  . Years of Education: N/A   Social History Main Topics  . Smoking status: Never Smoker   . Smokeless tobacco: Not on file  . Alcohol Use: No  . Drug Use: No  . Sexual Activity: Not on file   Other Topics Concern  . Not on file   Social History Narrative   Married (patient of Dr. 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 Poor dentition (advised dentist) CV: RRR no murmurs rubs or gallops Lungs: CTAB no crackles, wheeze, rhonchi Abdomen: soft/nontender/nondistended/normal bowel sounds.  Ext: 1+ pitting edema at ankles, thickened skin and venous stasis changes throughout calf Skin: warm, dry, mild erythema reflecting venous stasis changes on calves Neuro: grossly normal, moves all extremities, PERRLA   Assessment/Plan:  Essential hypertension Enalapril 10mg  BID prior. Patient stopped but BP controlled. Due to recent change, i asked patient to follow up with me within 3 months to see if we need to restart.    Asthma Advair, albuterol, singulair previously. Now taking only albuterol as rescue. Over 2-3 months has used albuterol only once. This seems like reasonable control of what would seem to be mild intermittent asthma. Follow up in 3 months and reassess-continue rescue only with sooner return precautions discussed.    URINARY INCONTINENCE, STRESS, FEMALE With coughing/sneezing. No pharmacologic medications approved by FDA. She did not have improvement on vesicare by Dr. Arnoldo Morale. We will discontinue, if bothers patient consider urology referral for further assessment. Currently she will wear pads at  times.    Osteopenia Advised patient to restart her calcium/vitamin D. Last testing 2010 actually showed osteoporosis in 1 area. Would consider repeat dexa within a year, may need bisphosphonate.    Return precautions advised. We also had a 5 minute discussion about need to lose weight. Patient knows she overeats and drinks a large amount of soda. She wants to focus on these 2 as knee pain too severe for exercise though I encouraged water activities. Discussed likely limited life expectancy if continues at current weight.   Update fasting labs today Orders Placed This Encounter  Procedures  . CBC    [place]  . Comprehensive metabolic panel        Order Specific Question:  Has the patient fasted?    Answer:  No  .  Hemoglobin A1c    [place]  . TSH    [place]  . Lipid panel    [place]    Order Specific Question:  Has the patient fasted?    Answer:  No

## 2014-03-29 ENCOUNTER — Other Ambulatory Visit (INDEPENDENT_AMBULATORY_CARE_PROVIDER_SITE_OTHER): Payer: BLUE CROSS/BLUE SHIELD

## 2014-03-29 DIAGNOSIS — I519 Heart disease, unspecified: Principal | ICD-10-CM

## 2014-03-29 DIAGNOSIS — E039 Hypothyroidism, unspecified: Secondary | ICD-10-CM

## 2014-03-29 LAB — T4, FREE: Free T4: 0.83 ng/dL (ref 0.60–1.60)

## 2014-06-22 ENCOUNTER — Encounter: Payer: Self-pay | Admitting: Gastroenterology

## 2014-06-26 ENCOUNTER — Encounter: Payer: Self-pay | Admitting: Family Medicine

## 2014-06-26 ENCOUNTER — Ambulatory Visit (INDEPENDENT_AMBULATORY_CARE_PROVIDER_SITE_OTHER): Payer: BLUE CROSS/BLUE SHIELD | Admitting: Family Medicine

## 2014-06-26 VITALS — BP 142/60 | HR 64 | Temp 97.9°F | Wt 288.0 lb

## 2014-06-26 DIAGNOSIS — N393 Stress incontinence (female) (male): Secondary | ICD-10-CM

## 2014-06-26 DIAGNOSIS — I10 Essential (primary) hypertension: Secondary | ICD-10-CM | POA: Diagnosis not present

## 2014-06-26 DIAGNOSIS — J4520 Mild intermittent asthma, uncomplicated: Secondary | ICD-10-CM | POA: Diagnosis not present

## 2014-06-26 MED ORDER — SOLIFENACIN SUCCINATE 10 MG PO TABS: 10.0000 mg | ORAL_TABLET | Freq: Every day | ORAL | Status: DC

## 2014-06-26 MED ORDER — ENALAPRIL MALEATE 10 MG PO TABS: 10.0000 mg | ORAL_TABLET | Freq: Every day | ORAL | Status: DC

## 2014-06-26 NOTE — Progress Notes (Signed)
Garret Reddish, MD  Subjective:  Melissa Kim is a 59 y.o. year old very pleasant female patient who presents with:  Asthma-very well controlled.  No uses of albuterol since last visit.   Stress incontinence. Slightly worsened off vesicare. Had been on samples. Would like to see cost at pharmacy. Wears pads-coughing/sneezing still worst of symptoms  Hypertension-mild poor control off benicar BID Weight down 9 lbs through watching portion size and cutting down on soda. BP Readings from Last 3 Encounters:  06/26/14 142/60  03/28/14 130/64  10/18/13 166/63   Home BP monitoring-no Compliant with medications-stopped meds before last visit but was controlled at that time.   ROS-Denies any CP, HA, SOB, blurry vision. Denies wheeze  Past Medical History- morbid obesity, psoriasis, osteopenia, uterine cancer, hyperglycemia  Medications- reviewed and updated Current Outpatient Prescriptions  Medication Sig Dispense Refill  . albuterol (PROVENTIL HFA;VENTOLIN HFA) 108 (90 BASE) MCG/ACT inhaler Inhale 2 puffs into the lungs every 6 (six) hours as needed. For shortness of breath     . Calcium 600-200 MG-UNIT per tablet Take 1 tablet by mouth daily.      . Cholecalciferol (VITAMIN D HIGH POTENCY) 1000 UNITS capsule Take 1,000 Units by mouth daily.     . enalapril (VASOTEC) 10 MG tablet Take 1 tablet (10 mg total) by mouth 2 (two) times daily. (Patient not taking: Reported on 06/26/2014) 60 tablet 6  . Fluticasone-Salmeterol (ADVAIR DISKUS) 250-50 MCG/DOSE AEPB Inhale 1 puff into the lungs every 12 (twelve) hours.      . montelukast (SINGULAIR) 10 MG tablet Take 10 mg by mouth at bedtime.      . multivitamin (THERAGRAN) per tablet Take 1 tablet by mouth daily.     . solifenacin (VESICARE) 5 MG tablet Take 10 mg by mouth daily.       No current facility-administered medications for this visit.    Objective: BP 142/60 mmHg  Pulse 64  Temp(Src) 97.9 F (36.6 C)  Wt 288 lb (130.636  kg) Gen: NAD, resting comfortably CV: RRR no murmurs rubs or gallops Lungs: CTAB no crackles, wheeze, rhonchi Abdomen: soft/nontender/nondistended/normal bowel sounds. No rebound or guarding.  Ext: no edema Skin: warm, dry Neuro: grossly normal, moves all extremities  Assessment/Plan:  Essential hypertension  Previously was controlled at 130 SBP off enalapril completely but despite this and 9 lbs weight gain, up today. Restart enalapril but only at 10mg  daily instead of BID.    Asthma Previously on  Advair, albuterol, singulair previously. Over last 6 months has only used albuterol 1-2x. Much improved control, continue rescue meds only.    URINARY INCONTINENCE, STRESS, FEMALE Worsening control off vesicare, will restart. Vesicare not FDA approved for stress but did improve symptoms and I suspect there are some mixed symptoms though stress is most prominent of them. Offered urology referral but declined at this time.     Meds ordered this encounter  Medications  . enalapril (VASOTEC) 10 MG tablet    Sig: Take 1 tablet (10 mg total) by mouth daily.    Dispense:  30 tablet    Refill:  5  . solifenacin (VESICARE) 10 MG tablet    Sig: Take 1 tablet (10 mg total) by mouth daily.    Dispense:  30 tablet    Refill:  5

## 2014-06-26 NOTE — Assessment & Plan Note (Signed)
Worsening control off vesicare, will restart. Vesicare not FDA approved for stress but did improve symptoms and I suspect there are some mixed symptoms though stress is most prominent of them. Offered urology referral but declined at this time.

## 2014-06-26 NOTE — Patient Instructions (Signed)
Let's go back to using vesicare since that helped you some. If too expensive, I would not pick up.   Blood pressure is too high. Let's restart benicar but this time just once a day.   Great job with losing 9 lbs! Keep up great effort. Best thing we can do for your overall healthy. Watch portion size and avoid soda.   See me in 3 months.

## 2014-06-26 NOTE — Assessment & Plan Note (Signed)
Previously was controlled at 130 SBP off enalapril completely but despite this and 9 lbs weight gain, up today. Restart enalapril but only at 10mg  daily instead of BID.

## 2014-06-26 NOTE — Assessment & Plan Note (Signed)
Previously on  Advair, albuterol, singulair previously. Over last 6 months has only used albuterol 1-2x. Much improved control, continue rescue meds only.

## 2014-06-27 ENCOUNTER — Telehealth: Payer: Self-pay | Admitting: Family Medicine

## 2014-06-27 DIAGNOSIS — R32 Unspecified urinary incontinence: Secondary | ICD-10-CM

## 2014-06-27 NOTE — Telephone Encounter (Signed)
See below

## 2014-06-27 NOTE — Telephone Encounter (Signed)
Referral for urology entered, pt aware.

## 2014-06-27 NOTE — Telephone Encounter (Signed)
May refer to urology for urinary incontinence if patient desires. There are alternatives but not strongest safety profile.

## 2014-06-27 NOTE — Telephone Encounter (Signed)
Pt can not afford vesicare. Pt needs different med [facility] [address] Cost was over 298.00

## 2014-07-13 ENCOUNTER — Encounter: Payer: Self-pay | Admitting: Family Medicine

## 2014-09-26 ENCOUNTER — Ambulatory Visit (INDEPENDENT_AMBULATORY_CARE_PROVIDER_SITE_OTHER): Payer: BLUE CROSS/BLUE SHIELD | Admitting: Family Medicine

## 2014-09-26 ENCOUNTER — Encounter: Payer: Self-pay | Admitting: Family Medicine

## 2014-09-26 VITALS — BP 134/70 | Temp 98.0°F | Ht 59.0 in | Wt 290.0 lb

## 2014-09-26 DIAGNOSIS — N3281 Overactive bladder: Secondary | ICD-10-CM | POA: Diagnosis not present

## 2014-09-26 DIAGNOSIS — I10 Essential (primary) hypertension: Secondary | ICD-10-CM

## 2014-09-26 DIAGNOSIS — J4520 Mild intermittent asthma, uncomplicated: Secondary | ICD-10-CM | POA: Diagnosis not present

## 2014-09-26 NOTE — Patient Instructions (Signed)
Medication Instructions:  Same, see list (leaving advair and singular on list for now but obviously you are not taking these.)  Other Instructions:  Drink water only- avoid sweet tea and soda to help with weight loss Blood pressure looks great- continue enalapril 1 pill a day  Labwork: None today, plan at follow up  Testing/Procedures/Immunizations: None today If you want shingles vaccine, ask your insurance company where you should get the injection  Follow-Up: 6 months follow up  Happy to see you Sooner if new or worsening symptoms

## 2014-09-26 NOTE — Assessment & Plan Note (Signed)
S:10mg  enalapril. bp controlled BP Readings from Last 3 Encounters:  09/26/14 134/70  06/26/14 142/60- was off enalapril completely  03/28/14 130/64  A/P: continue current rx

## 2014-09-26 NOTE — Progress Notes (Signed)
Garret Reddish, MD  Subjective:  Melissa Kim is a 59 y.o. year old very pleasant female patient who presents with:  See problem oriented charting ROS-Denies any CP, HA, SOB, blurry vision, LE edema worsening  Past Medical History- morbid obesity (discussed cutting out sweet tea (has cut down on soda), psoriasis, osteopenia, hyperglycemia, vit d deficiency  Medications- reviewed and updated Current Outpatient Prescriptions  Medication Sig Dispense Refill  . albuterol (PROVENTIL HFA;VENTOLIN HFA) 108 (90 BASE) MCG/ACT inhaler Inhale 2 puffs into the lungs every 6 (six) hours as needed. For shortness of breath     . b complex vitamins tablet Take 1 tablet by mouth daily.    . Calcium 600-200 MG-UNIT per tablet Take 1 tablet by mouth daily.      . Cholecalciferol (VITAMIN D HIGH POTENCY) 1000 UNITS capsule Take 1,000 Units by mouth daily.     . Cyanocobalamin (B-12 PO) Take by mouth.    . enalapril (VASOTEC) 10 MG tablet Take 1 tablet (10 mg total) by mouth daily. 30 tablet 5  . multivitamin (THERAGRAN) per tablet Take 1 tablet by mouth daily.     . Fluticasone-Salmeterol (ADVAIR DISKUS) 250-50 MCG/DOSE AEPB Inhale 1 puff into the lungs every 12 (twelve) hours.      . montelukast (SINGULAIR) 10 MG tablet Take 10 mg by mouth at bedtime.      . solifenacin (VESICARE) 10 MG tablet Take 1 tablet (10 mg total) by mouth daily. (Patient not taking: Reported on 09/26/2014) 30 tablet 5   Objective: BP 134/70 mmHg  Temp(Src) 98 F (36.7 C) (Oral)  Ht 4\' 11"  (1.499 m)  Wt 290 lb (131.543 kg)  BMI 58.54 kg/m2 Gen: NAD, resting comfortably in chair, struggles to walk due to discomfort CV: RRR no murmurs rubs or gallops Lungs: CTAB no crackles, wheeze, rhonchi Abdomen: soft/nontender/nondistended/normal bowel sounds. No rebound or guarding. Morbidly obese.  Ext: venous stasis changes. 1+ pitting edema under thickened skin Skin: warm, dry Neuro: grossly normal, moves all extremities    Assessment/Plan:  Essential hypertension S:10mg  enalapril. bp controlled BP Readings from Last 3 Encounters:  09/26/14 134/70  06/26/14 142/60- was off enalapril completely  03/28/14 130/64  A/P: continue current rx   Asthma S:Asthma 6 months controlled. 1-2x. Previously advair and singulair. 2x a month during last month, none month prior.  A/P: controlled, continue current rx of albuterol for rescue alone. Close follow up if increases over twice a month.    Overactive bladder S:vesicare improved stress incontinence- could not afford when refilled. Sent to urology. They evaluated her and stated in fact overactive bladder not stress incontinence as diagnosed by Dr. Arnoldo Morale. She did get a rash with Lisbeth Ply and stopped. Holding off on returning until finances better.  A/P: we discussed oxybutynin potentially but patient wants to return to urology when accumulates the finances for this.    6 month f/u

## 2014-09-26 NOTE — Progress Notes (Signed)
Pre visit review using our clinic review tool, if applicable. No additional management support is needed unless otherwise documented below in the visit note. 

## 2014-09-26 NOTE — Assessment & Plan Note (Signed)
S:Asthma 6 months controlled. 1-2x. Previously advair and singulair. 2x a month during last month, none month prior.  A/P: controlled, continue current rx of albuterol for rescue alone. Close follow up if increases over twice a month.

## 2014-09-26 NOTE — Assessment & Plan Note (Signed)
S:vesicare improved stress incontinence- could not afford when refilled. Sent to urology. They evaluated her and stated in fact overactive bladder not stress incontinence as diagnosed by Dr. Arnoldo Morale. She did get a rash with Lisbeth Ply and stopped. Holding off on returning until finances better.  A/P: we discussed oxybutynin potentially but patient wants to return to urology when accumulates the finances for this.

## 2015-04-03 ENCOUNTER — Ambulatory Visit (INDEPENDENT_AMBULATORY_CARE_PROVIDER_SITE_OTHER): Payer: BLUE CROSS/BLUE SHIELD | Admitting: Family Medicine

## 2015-04-03 ENCOUNTER — Other Ambulatory Visit: Payer: Self-pay | Admitting: Family Medicine

## 2015-04-03 ENCOUNTER — Encounter: Payer: Self-pay | Admitting: Family Medicine

## 2015-04-03 VITALS — BP 130/70 | HR 65 | Temp 98.5°F | Wt 295.0 lb

## 2015-04-03 DIAGNOSIS — N3281 Overactive bladder: Secondary | ICD-10-CM

## 2015-04-03 DIAGNOSIS — R739 Hyperglycemia, unspecified: Secondary | ICD-10-CM | POA: Diagnosis not present

## 2015-04-03 DIAGNOSIS — E785 Hyperlipidemia, unspecified: Secondary | ICD-10-CM

## 2015-04-03 DIAGNOSIS — I10 Essential (primary) hypertension: Secondary | ICD-10-CM

## 2015-04-03 DIAGNOSIS — Z20828 Contact with and (suspected) exposure to other viral communicable diseases: Secondary | ICD-10-CM

## 2015-04-03 DIAGNOSIS — Z23 Encounter for immunization: Secondary | ICD-10-CM

## 2015-04-03 DIAGNOSIS — J4520 Mild intermittent asthma, uncomplicated: Secondary | ICD-10-CM

## 2015-04-03 LAB — COMPREHENSIVE METABOLIC PANEL
ALT: 15 U/L (ref 0–35)
AST: 12 U/L (ref 0–37)
Albumin: 3.9 g/dL (ref 3.5–5.2)
Alkaline Phosphatase: 101 U/L (ref 39–117)
BUN: 11 mg/dL (ref 6–23)
CO2: 34 mEq/L — ABNORMAL HIGH (ref 19–32)
Calcium: 9.4 mg/dL (ref 8.4–10.5)
Chloride: 101 mEq/L (ref 96–112)
Creatinine, Ser: 0.56 mg/dL (ref 0.40–1.20)
GFR: 116.74 mL/min (ref >60.00)
GLUCOSE: 92 mg/dL (ref 70–99)
POTASSIUM: 4.7 meq/L (ref 3.5–5.1)
SODIUM: 142 meq/L (ref 135–145)
Total Bilirubin: 0.5 mg/dL (ref 0.2–1.2)
Total Protein: 7.1 g/dL (ref 6.0–8.3)

## 2015-04-03 LAB — CBC
HCT: 39.4 % (ref 36.0–46.0)
HEMOGLOBIN: 13.1 g/dL (ref 12.0–15.0)
MCHC: 33.3 g/dL (ref 30.0–36.0)
MCV: 87.2 fl (ref 78.0–100.0)
Platelets: 241 10*3/uL (ref 150.0–400.0)
RBC: 4.52 Mil/uL (ref 3.87–5.11)
RDW: 15.1 % (ref 11.5–15.5)
WBC: 8.5 10*3/uL (ref 4.0–10.5)

## 2015-04-03 LAB — HEMOGLOBIN A1C: Hgb A1c MFr Bld: 5.6 % (ref 4.6–6.5)

## 2015-04-03 LAB — LDL CHOLESTEROL, DIRECT: Direct LDL: 118 mg/dL

## 2015-04-03 NOTE — Progress Notes (Signed)
Garret Reddish, MD  Subjective:  Melissa Kim is a 60 y.o. year old very pleasant female patient who presents for/with See problem oriented charting ROS- does complain of some pain on top of her right foot intermittently- better when not at work/off feet. No worsening of edema- severe venous stasis changes at baseline. No chest pain. No headaches.   Past Medical History-  Patient Active Problem List   Diagnosis Date Noted  . OBESITY, MORBID 11/30/2007    Priority: High  . Uterine cancer ([place]) 03/28/2014    Priority: Medium  . Hyperglycemia 03/28/2014    Priority: Medium  . Overactive bladder 09/24/2009    Priority: Medium  . Osteopenia 12/05/2008    Priority: Medium  . Essential hypertension 06/01/2007    Priority: Medium  . Asthma 09/29/2006    Priority: Medium  . Psoriasis 09/29/2006    Priority: Medium  . Osteoarthritis, knee 03/28/2014    Priority: Low  . Vitamin D deficiency 11/20/2009    Priority: Low  . Venous (peripheral) insufficiency 09/29/2006    Priority: Low  . GERD 09/29/2006    Priority: Low    Medications- reviewed and updated Current Outpatient Prescriptions  Medication Sig Dispense Refill  . albuterol (PROVENTIL HFA;VENTOLIN HFA) 108 (90 BASE) MCG/ACT inhaler Inhale 2 puffs into the lungs every 6 (six) hours as needed. For shortness of breath     . b complex vitamins tablet Take 1 tablet by mouth daily.    . Calcium 600-200 MG-UNIT per tablet Take 1 tablet by mouth daily.      . Cholecalciferol (VITAMIN D HIGH POTENCY) 1000 UNITS capsule Take 1,000 Units by mouth daily.     . Cyanocobalamin (B-12 PO) Take by mouth.    . enalapril (VASOTEC) 10 MG tablet Take 1 tablet (10 mg total) by mouth daily. 30 tablet 5  . multivitamin (THERAGRAN) per tablet Take 1 tablet by mouth daily.      No current facility-administered medications for this visit.    Objective: BP 130/70 mmHg  Pulse 65  Temp(Src) 98.5 F (36.9 C)  Wt 295 lb (133.811 kg) Gen: NAD,  resting comfortably CV: RRR no murmurs rubs or gallops Lungs: CTAB no crackles, wheeze, rhonchi Abdomen: soft/nontender/nondistended/normal bowel sounds. No rebound or guarding. Morbidly obese. Ext: 2+ edema with severe venous stasis changes Skin: warm, dry, no rash Neuro: grossly normal, moves all extremities, antalgic gait  Assessment/Plan:  Essential hypertension S: controlled. On Enalapril 10mg .   BP Readings from Last 3 Encounters:  04/03/15 130/70  09/26/14 134/70  06/26/14 142/60  A/P:Continue current meds as doing well   Hyperglycemia S: weight up another 5 lbs. Warned of diabetes risk- due to little family history she expresses she is not concerned. Frequently drinks tea/soda at work. Overeats. No exercise.  A/P: patient is not motivated to change despite risks. Encouraged her but honestly seemed to have little effect. Main push was for a bridge from soda/tea to water with artificial sweeteners (had contaminated well water as child and really hard for her to drink water)   Asthma S: continues to use albuterol only. In last month has not had to use at all. Previously about 1-2x a week A/P: seems to be controlled off of the advair and singulair for months now- remove from list at this point and continue albuterol alone   Overactive bladder S: continued issues. She has not had finances to return to urology.  A/P: patient is tolerating current level of incontinence- we discussed there  are options if she changes her mind on not undergoing treatment     Return in about 6 months (around 10/01/2015). Return precautions advised.   Orders Placed This Encounter  Procedures  . Flu Vaccine QUAD 36+ mos IM  . CBC    [place]  . Comprehensive metabolic panel    [place]  . LDL cholesterol, direct    [place]  . Hepatitis C antibody, reflex    [facility]  . HIV antibody  . Hemoglobin A1c    [place]

## 2015-04-03 NOTE — Assessment & Plan Note (Signed)
S: weight up another 5 lbs. Warned of diabetes risk- due to little family history she expresses she is not concerned. Frequently drinks tea/soda at work. Overeats. No exercise.  A/P: patient is not motivated to change despite risks. Encouraged her but honestly seemed to have little effect. Main push was for a bridge from soda/tea to water with artificial sweeteners (had contaminated well water as child and really hard for her to drink water)

## 2015-04-03 NOTE — Assessment & Plan Note (Signed)
S: continued issues. She has not had finances to return to urology.  A/P: patient is tolerating current level of incontinence- we discussed there are options if she changes her mind on not undergoing treatment

## 2015-04-03 NOTE — Assessment & Plan Note (Signed)
S: continues to use albuterol only. In last month has not had to use at all. Previously about 1-2x a week A/P: seems to be controlled off of the advair and singulair for months now- remove from list at this point and continue albuterol alone

## 2015-04-03 NOTE — Assessment & Plan Note (Signed)
S: controlled. On Enalapril 10mg .   BP Readings from Last 3 Encounters:  04/03/15 130/70  09/26/14 134/70  06/26/14 142/60  A/P:Continue current meds as doing well

## 2015-04-03 NOTE — Patient Instructions (Addendum)
Flu shot received today.  Labs before you leave  Wt Readings from Last 3 Encounters:  04/03/15 295 lb (133.811 kg)  09/26/14 290 lb (131.543 kg)  06/26/14 288 lb (130.636 kg)  weight is trending up increasing your risk for many diseases including diabetes, high cholesterol. Losing weight is key for your longterm health  Call your insurace about the shingles shot to see if it is covered or how much it would cost and where is cheaper (here or pharmacy).  If you want to receive here, call for nurse visit.

## 2015-04-04 LAB — HIV ANTIBODY (ROUTINE TESTING W REFLEX): HIV 1&2 Ab, 4th Generation: NONREACTIVE

## 2015-04-04 LAB — HEPATITIS C ANTIBODY: HCV AB: NEGATIVE

## 2015-04-24 LAB — HM MAMMOGRAPHY: HM MAMMO: NORMAL

## 2015-04-26 ENCOUNTER — Encounter: Payer: Self-pay | Admitting: Family Medicine

## 2015-09-25 ENCOUNTER — Other Ambulatory Visit (INDEPENDENT_AMBULATORY_CARE_PROVIDER_SITE_OTHER): Payer: BLUE CROSS/BLUE SHIELD

## 2015-09-25 DIAGNOSIS — Z0000 Encounter for general adult medical examination without abnormal findings: Secondary | ICD-10-CM

## 2015-09-25 LAB — BASIC METABOLIC PANEL
BUN: 10 mg/dL (ref 6–23)
CALCIUM: 9.4 mg/dL (ref 8.4–10.5)
CO2: 30 meq/L (ref 19–32)
CREATININE: 0.53 mg/dL (ref 0.40–1.20)
Chloride: 102 mEq/L (ref 96–112)
GFR: 124.2 mL/min (ref >60.00)
Glucose, Bld: 112 mg/dL — ABNORMAL HIGH (ref 70–99)
Potassium: 3.7 mEq/L (ref 3.5–5.1)
Sodium: 139 mEq/L (ref 135–145)

## 2015-09-25 LAB — HEPATIC FUNCTION PANEL
ALK PHOS: 96 U/L (ref 39–117)
ALT: 15 U/L (ref 0–35)
AST: 15 U/L (ref 0–37)
Albumin: 3.6 g/dL (ref 3.5–5.2)
BILIRUBIN DIRECT: 0.1 mg/dL (ref 0.0–0.3)
BILIRUBIN TOTAL: 0.5 mg/dL (ref 0.2–1.2)
TOTAL PROTEIN: 7.1 g/dL (ref 6.0–8.3)

## 2015-09-25 LAB — CBC WITH DIFFERENTIAL/PLATELET
BASOS ABS: 0 10*3/uL (ref 0.0–0.1)
Basophils Relative: 0.5 % (ref 0.0–3.0)
EOS ABS: 0.1 10*3/uL (ref 0.0–0.7)
Eosinophils Relative: 2.3 % (ref 0.0–5.0)
HEMATOCRIT: 38.8 % (ref 36.0–46.0)
Hemoglobin: 13 g/dL (ref 12.0–15.0)
LYMPHS PCT: 19.8 % (ref 12.0–46.0)
Lymphs Abs: 1.2 10*3/uL (ref 0.7–4.0)
MCHC: 33.5 g/dL (ref 30.0–36.0)
MCV: 87.4 fl (ref 78.0–100.0)
Monocytes Absolute: 0.5 10*3/uL (ref 0.1–1.0)
Monocytes Relative: 8.3 % (ref 3.0–12.0)
NEUTROS ABS: 4.1 10*3/uL (ref 1.4–7.7)
NEUTROS PCT: 69.1 % (ref 43.0–77.0)
PLATELETS: 219 10*3/uL (ref 150.0–400.0)
RBC: 4.43 Mil/uL (ref 3.87–5.11)
RDW: 15.4 % (ref 11.5–15.5)
WBC: 5.9 10*3/uL (ref 4.0–10.5)

## 2015-09-25 LAB — LIPID PANEL
CHOLESTEROL: 174 mg/dL (ref 0–200)
HDL: 58.1 mg/dL (ref >39.00)
LDL Cholesterol: 93 mg/dL (ref 0–99)
NONHDL: 116.3
Total CHOL/HDL Ratio: 3
Triglycerides: 116 mg/dL (ref 0.0–149.0)
VLDL: 23.2 mg/dL (ref 0.0–40.0)

## 2015-09-25 LAB — POC URINALSYSI DIPSTICK (AUTOMATED)
Bilirubin, UA: NEGATIVE
Glucose, UA: NEGATIVE
Ketones, UA: NEGATIVE
NITRITE UA: NEGATIVE
PH UA: 5.5
PROTEIN UA: NEGATIVE
Spec Grav, UA: 1.015
UROBILINOGEN UA: 0.2

## 2015-09-25 LAB — TSH: TSH: 5.84 u[IU]/mL — AB (ref 0.35–4.50)

## 2015-10-02 ENCOUNTER — Ambulatory Visit (INDEPENDENT_AMBULATORY_CARE_PROVIDER_SITE_OTHER): Payer: BLUE CROSS/BLUE SHIELD | Admitting: Family Medicine

## 2015-10-02 ENCOUNTER — Encounter: Payer: Self-pay | Admitting: Family Medicine

## 2015-10-02 VITALS — BP 132/78 | HR 79 | Temp 97.9°F | Ht 58.5 in | Wt 315.4 lb

## 2015-10-02 DIAGNOSIS — Z780 Asymptomatic menopausal state: Secondary | ICD-10-CM

## 2015-10-02 DIAGNOSIS — Z0001 Encounter for general adult medical examination with abnormal findings: Secondary | ICD-10-CM | POA: Diagnosis not present

## 2015-10-02 DIAGNOSIS — R319 Hematuria, unspecified: Secondary | ICD-10-CM | POA: Diagnosis not present

## 2015-10-02 DIAGNOSIS — R6889 Other general symptoms and signs: Secondary | ICD-10-CM

## 2015-10-02 LAB — URINALYSIS, MICROSCOPIC ONLY: RBC / HPF: NONE SEEN (ref 0–?)

## 2015-10-02 NOTE — Progress Notes (Signed)
Pre visit review using our clinic review tool, if applicable. No additional management support is needed unless otherwise documented below in the visit note. 

## 2015-10-02 NOTE — Progress Notes (Signed)
Phone: [phone]  Subjective:  Patient presents today for their annual physical. Chief complaint-noted.   See problem oriented charting- ROS- full  review of systems was completed and negative except for: lesions from psoriasis. Knee pain, incontinence  The following were reviewed and entered/updated in epic: Past Medical History:  Diagnosis Date  . Asthma   . Cancer ([place])   . DIVERTICULITIS, HX OF 05/30/2008  . Diverticulosis   . DIVERTICULOSIS, COLON 05/30/2008  . Fx foot bone NEC-closed   . GERD (gastroesophageal reflux disease)   . Hypertension   . Obesity   . Psoriasis   . STRESS FRACTURE, FOOT 12/05/2008   reports multiple  . Venous stasis dermatitis    Patient Active Problem List   Diagnosis Date Noted  . Morbid obesity ([place]) 11/30/2007    Priority: High  . Uterine cancer ([place]) 03/28/2014    Priority: Medium  . Hyperglycemia 03/28/2014    Priority: Medium  . Overactive bladder 09/24/2009    Priority: Medium  . Osteopenia 12/05/2008    Priority: Medium  . Essential hypertension 06/01/2007    Priority: Medium  . Asthma 09/29/2006    Priority: Medium  . Psoriasis 09/29/2006    Priority: Medium  . Osteoarthritis, knee 03/28/2014    Priority: Low  . Vitamin D deficiency 11/20/2009    Priority: Low  . Venous (peripheral) insufficiency 09/29/2006    Priority: Low  . GERD 09/29/2006    Priority: Low   Past Surgical History:  Procedure Laterality Date  . ABDOMINAL HYSTERECTOMY     complete reported including cervix. For uterine cancer. released by GYN.   Marland Kitchen APPENDECTOMY    . arthroscopic of knee    . CHOLECYSTECTOMY    . TONSILLECTOMY      Family History  Problem Relation Age of Onset  . Hypertension Mother   . Heart disease Father     MI in late 63s, early 25s, smoker  . Pancreatic cancer Mother     Medications- reviewed and updated Current Outpatient Prescriptions  Medication Sig Dispense Refill  . albuterol (PROVENTIL HFA;VENTOLIN HFA) 108  (90 BASE) MCG/ACT inhaler Inhale 2 puffs into the lungs every 6 (six) hours as needed. For shortness of breath     . b complex vitamins tablet Take 1 tablet by mouth daily.    . Calcium 600-200 MG-UNIT per tablet Take 1 tablet by mouth daily.      . Cholecalciferol (VITAMIN D HIGH POTENCY) 1000 UNITS capsule Take 1,000 Units by mouth daily.     . Cyanocobalamin (B-12 PO) Take by mouth.    . multivitamin (THERAGRAN) per tablet Take 1 tablet by mouth daily.      No current facility-administered medications for this visit.     Allergies-reviewed and updated Allergies  Allergen Reactions  . Aspirin     REACTION: Gi upset  . Ibuprofen     REACTION: GI upset  . Losartan Potassium-Hctz     REACTION: itching  . Peg 3350-Electrolytes     REACTION: vomiting  . Strawberry Extract     Social History   Social History  . Marital status: Married    Spouse name: N/A  . Number of children: N/A  . Years of education: N/A   Social History Main Topics  . Smoking status: Never Smoker  . Smokeless tobacco: None  . Alcohol use No  . Drug use: No  . Sexual activity: Not Asked   Other Topics Concern  . None   Social History  Narrative   Married (patient of Dr. 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 Oropharynx normal Neck: no thyromegaly CV: RRR no murmurs rubs or gallops Lungs: CTAB no crackles, wheeze, rhonchi Abdomen: soft/nontender/nondistended/normal bowel sounds. No rebound or guarding. Morbid obesity Ext: hardening of skin from chronic venous stasis- despite this still with 1+ pitting edema in areas Skin: warm, dry, venous stasis  changes Neuro: grossly normal, moves all extremities, PERRLA  Assessment/Plan:  60 y.o. female presenting for annual physical.  Health Maintenance counseling: 1. Anticipatory guidance: Patient counseled regarding regular dental exams, eye exams, wearing seatbelts.  2. Risk factor reduction:  Advised patient of need for regular exercise and diet rich and fruits and vegetables to reduce risk of heart attack and stroke. States needs to cut out extra sweets- had several blackberry cobblers at work. Still drinking soda at work.  3. Immunizations/screenings/ancillary studies Immunization History  Administered Date(s) Administered  . Influenza Split 12/30/2010, 11/25/2011  . Influenza Whole 11/17/2008, 11/20/2009  . Influenza,inj,Quad PF,36+ Mos 11/02/2012, 04/03/2015  . Td 02/17/2001  . Tdap 08/09/2012   Health Maintenance Due  Topic Date Due  . ZOSTAVAX - wants to consider, check with insurance 08/22/2013  . INFLUENZA VACCINE - advise to get flu shot this season 09/18/2015   4. Cervical cancer screening-  See below 5. Breast cancer screening-  breast exam today declines and mammogram 04/24/15 normal 6. Colon cancer screening - 04/2008 with 10 year repeat  Status of chronic or acute concerns  Hematuria on labs- urine microscopic today  Hyperglycemia- discussed metformin option. CBG 112. Declines metformin Lab Results  Component Value Date   HGBA1C 5.6 04/03/2015   History of uterine cancer- had removal including cervix- was told no further pap smears. Ovaries also removed.   Psoriasis- declines treatment  OAB- vesicare in past but cannot afford and does not want to take meds for this  Osteopenia- advised repeat DEXA- suspect this may actually be osteoporosis. Clear calcium/vit D  HTN- controlled on enalapril alone  Asthma- albuterol only at this point about once a month  Knee arthritis- worsening pain- discussed how this was primarily related to her obesity and being up 25 lbs.  Advised weight loss. Check in 6 months- refer to ortho if not progressing to trial injections (would not feel strongly about nsaids with her morbid obesity, hypertension, increased cardiac risk though injectoins may drive up CBGs). She is wondering about disability- discussed with her would work on weight loss first and treatment before moving to that.   Morbid obesity ([place]) S: discussed need for weight loss Wt Readings from Last 3 Encounters:  10/02/15 (!) 315 lb 6.4 oz (143.1 kg)  04/03/15 295 lb (133.8 kg)  09/26/14 290 lb (131.5 kg)  A/P: low motivation- weight continues to trend up, suspect will have abbreviated lifespan unfortunately as result  6 months  Orders Placed This Encounter  Procedures  . DG Bone Density    Standing Status:   Future    Standing Expiration Date:   12/01/2016  Order Specific Question:   Reason for Exam (SYMPTOM  OR DIAGNOSIS REQUIRED)    Answer:   postmenopausal    Order Specific Question:   Preferred imaging location?    Answer:   [facility]  . Urine Microscopic Only    Return precautions advised.   Garret Reddish, MD

## 2015-10-02 NOTE — Assessment & Plan Note (Signed)
S: discussed need for weight loss Wt Readings from Last 3 Encounters:  10/02/15 (!) 315 lb 6.4 oz (143.1 kg)  04/03/15 295 lb (133.8 kg)  09/26/14 290 lb (131.5 kg)  A/P: low motivation- weight continues to trend up, suspect will have abbreviated lifespan unfortunately as result

## 2015-10-02 NOTE — Patient Instructions (Addendum)
Check urine before you leave  Schedule your bone density test at check out desk  dont forget to get your flu shot- let us know when you get it if you dont get it here  Weight loss is key to reduce # of stress fractures, risks for long term health. I think returning to weight watchers would be great for you

## 2015-10-09 ENCOUNTER — Inpatient Hospital Stay: Admission: RE | Admit: 2015-10-09 | Payer: BLUE CROSS/BLUE SHIELD | Source: Ambulatory Visit

## 2016-03-04 ENCOUNTER — Telehealth: Payer: Self-pay | Admitting: Family Medicine

## 2016-03-04 NOTE — Telephone Encounter (Signed)
I want Melissa Kim to walk as much as able. His wife is more limited but he is able to walk. I will not write this letter.

## 2016-03-04 NOTE — Telephone Encounter (Signed)
Pt would like Dr Yong Channel to write a letter that states pt (and husband) unable, not physically able,  to walk out to the street to get their mail.  Would like permission to move the mailbox to the house Pt states 60- 100 ft to the mailbox.

## 2016-03-07 NOTE — Telephone Encounter (Signed)
Pt has been advised.

## 2016-03-13 ENCOUNTER — Ambulatory Visit (INDEPENDENT_AMBULATORY_CARE_PROVIDER_SITE_OTHER): Payer: PRIVATE HEALTH INSURANCE

## 2016-03-13 DIAGNOSIS — Z23 Encounter for immunization: Secondary | ICD-10-CM | POA: Diagnosis not present

## 2016-04-03 ENCOUNTER — Ambulatory Visit: Payer: BLUE CROSS/BLUE SHIELD | Admitting: Family Medicine

## 2017-05-27 ENCOUNTER — Emergency Department (HOSPITAL_COMMUNITY)
Admission: EM | Admit: 2017-05-27 | Discharge: 2017-05-27 | Disposition: A | Discharge status: Home / Self Care | Payer: PRIVATE HEALTH INSURANCE | Attending: Physician Assistant | Admitting: Physician Assistant

## 2017-05-27 ENCOUNTER — Emergency Department (HOSPITAL_COMMUNITY): Payer: PRIVATE HEALTH INSURANCE

## 2017-05-27 ENCOUNTER — Encounter (HOSPITAL_COMMUNITY): Payer: Self-pay

## 2017-05-27 DIAGNOSIS — J45909 Unspecified asthma, uncomplicated: Secondary | ICD-10-CM | POA: Insufficient documentation

## 2017-05-27 DIAGNOSIS — Z8542 Personal history of malignant neoplasm of other parts of uterus: Secondary | ICD-10-CM | POA: Insufficient documentation

## 2017-05-27 DIAGNOSIS — M1712 Unilateral primary osteoarthritis, left knee: Secondary | ICD-10-CM | POA: Insufficient documentation

## 2017-05-27 DIAGNOSIS — I10 Essential (primary) hypertension: Secondary | ICD-10-CM | POA: Insufficient documentation

## 2017-05-27 MED ORDER — NAPROXEN 375 MG PO TABS: 375.0000 mg | ORAL_TABLET | Freq: Two times a day (BID) | ORAL | 0 refills | Status: DC

## 2017-05-27 MED ORDER — ENALAPRIL MALEATE 5 MG PO TABS: 5.0000 mg | ORAL_TABLET | Freq: Every day | ORAL | 0 refills | Status: DC

## 2017-05-27 NOTE — ED Triage Notes (Signed)
Pt comes via [facility] for L: knee pain that has been going on for 4 days, denies injury

## 2017-05-27 NOTE — ED Notes (Signed)
ED Provider at bedside. 

## 2017-05-27 NOTE — ED Provider Notes (Signed)
[facility] EMERGENCY DEPARTMENT Provider Note   CSN: [id number] Arrival date & time: 05/27/17  2011     History   Chief Complaint Chief Complaint  Patient presents with  . Knee Pain    HPI Melissa Kim is a 62 y.o. female with history of hypertension, obesity, asthma who presents with a 4-day history of left knee pain.  Patient reports she had the pain months ago, however it only lasted about 2-3 days.  She denies any fevers.  It is worse with walking and movement.  She denies any other symptoms.  Patient reports she used to be on enalapril for her blood pressure, however stopped taking it because she ran out of the prescription and did not have it refilled.  She denies any chest pain or shortness of breath.  HPI  Past Medical History:  Diagnosis Date  . Asthma   . Cancer ([place])   . DIVERTICULITIS, HX OF 05/30/2008  . Diverticulosis   . DIVERTICULOSIS, COLON 05/30/2008  . Fx foot bone NEC-closed   . GERD (gastroesophageal reflux disease)   . Hypertension   . Obesity   . Psoriasis   . STRESS FRACTURE, FOOT 12/05/2008   reports multiple  . Venous stasis dermatitis     Patient Active Problem List   Diagnosis Date Noted  . Osteoarthritis, knee 03/28/2014  . Uterine cancer ([place]) 03/28/2014  . Hyperglycemia 03/28/2014  . Vitamin D deficiency 11/20/2009  . Overactive bladder 09/24/2009  . Osteopenia 12/05/2008  . Morbid obesity ([place]) 11/30/2007  . Essential hypertension 06/01/2007  . Venous (peripheral) insufficiency 09/29/2006  . Asthma 09/29/2006  . GERD 09/29/2006  . Psoriasis 09/29/2006    Past Surgical History:  Procedure Laterality Date  . ABDOMINAL HYSTERECTOMY     complete reported including cervix. For uterine cancer. released by GYN.   Marland Kitchen APPENDECTOMY    . arthroscopic of knee    . CHOLECYSTECTOMY    . TONSILLECTOMY       OB History   None      Home Medications    Prior to Admission medications   Medication Sig Start  Date End Date Taking? Authorizing Provider  albuterol (PROVENTIL HFA;VENTOLIN HFA) 108 (90 BASE) MCG/ACT inhaler Inhale 2 puffs into the lungs every 6 (six) hours as needed. For shortness of breath  06/25/10   Ricard Dillon, MD  b complex vitamins tablet Take 1 tablet by mouth daily.    [provider]  Calcium 600-200 MG-UNIT per tablet Take 1 tablet by mouth daily.      [provider]  Cholecalciferol (VITAMIN D HIGH POTENCY) 1000 UNITS capsule Take 1,000 Units by mouth daily.     [provider]  Cyanocobalamin (B-12 PO) Take by mouth.    [provider]  enalapril (VASOTEC) 5 MG tablet Take 1 tablet (5 mg total) by mouth daily. 05/27/17   Anah Billard, Bea Graff, PA-C  multivitamin [facility]) per tablet Take 1 tablet by mouth daily.     [provider]  naproxen (NAPROSYN) 375 MG tablet Take 1 tablet (375 mg total) by mouth 2 (two) times daily. 05/27/17   Frederica Kuster, PA-C    Family History Family History  Problem Relation Age of Onset  . Heart disease Father        MI in late 32s, early 7s, smoker  . Hypertension Mother   . Pancreatic cancer Mother     Social History Social History   Tobacco Use  .  Smoking status: Never Smoker  . Smokeless tobacco: Never Used  Substance Use Topics  . Alcohol use: No  . Drug use: No     Allergies   Aspirin; Ibuprofen; Losartan potassium-hctz; Peg 3350-electrolytes; and Strawberry extract   Review of Systems Review of Systems  Constitutional: Negative for fever.  Musculoskeletal: Positive for arthralgias (L knee).  Neurological: Negative for numbness.     Physical Exam Updated Vital Signs BP (!) 190/83 (BP Location: Right Wrist)   Pulse 83   Temp 98.5 F (36.9 C) (Oral)   Resp 16   SpO2 99%   Physical Exam  Constitutional: She appears well-developed and well-nourished. No distress.  Morbidly obese  HENT:  Head: Normocephalic and atraumatic.  Mouth/Throat: Oropharynx is clear and  moist. No oropharyngeal exudate.  Eyes: Pupils are equal, round, and reactive to light. Conjunctivae are normal. Right eye exhibits no discharge. Left eye exhibits no discharge. No scleral icterus.  Neck: Normal range of motion. Neck supple. No thyromegaly present.  Cardiovascular: Normal rate, regular rhythm, normal heart sounds and intact distal pulses. Exam reveals no gallop and no friction rub.  No murmur heard. Pulmonary/Chest: Effort normal and breath sounds normal. No stridor. No respiratory distress. She has no wheezes. She has no rales.  Musculoskeletal: She exhibits no edema.       Left knee: She exhibits bony tenderness. She exhibits no LCL laxity and no MCL laxity. Tenderness found.  Tenderness throughout the knee, mild edema, no erythema or warmth, range of motion intact, however with pain for flexion, pain with varus and valgus stress  Lymphadenopathy:    She has no cervical adenopathy.  Neurological: She is alert. Coordination normal.  Skin: Skin is warm and dry. No rash noted. She is not diaphoretic. No pallor.  Psychiatric: She has a normal mood and affect.  Nursing note and vitals reviewed.    ED Treatments / Results  Labs (all labs ordered are listed, but only abnormal results are displayed) Labs Reviewed - No data to display  EKG None  Radiology Dg Knee Complete 4 Views Left  Result Date: 05/27/2017 CLINICAL DATA:  Left knee pain for 3 days.  No known injury. EXAM: LEFT KNEE - COMPLETE 4+ VIEW COMPARISON:  Radiographs 04/24/2003 FINDINGS: No fracture or dislocation. Progressive osteoarthritis from prior exam, now moderate to severe, with near complete joint space loss of the medial tibiofemoral compartment. Moderate tricompartmental peripheral osteophytes. Moderate joint effusion. Vague diffuse sheet like soft tissue calcifications about the included lower leg may be dermal or in the subcutaneous tissues. IMPRESSION: Moderate to severe osteoarthritis without acute  osseous abnormality. Moderate joint effusion. Electronically Signed   By: Jeb Levering M.D.   On: 05/27/2017 21:08    Procedures Procedures (including critical care time)  Medications Ordered in ED Medications - No data to display   Initial Impression / Assessment and Plan / ED Course  I have reviewed the triage vital signs and the nursing notes.  Pertinent labs & imaging results that were available during my care of the patient were reviewed by me and considered in my medical decision making (see chart for details).     Patient with 4-day history of left knee pain.  X-ray shows moderate to severe osteoarthritis without acute osseous abnormality and moderate joint effusion.  Low suspicion for septic joint.  Will treat with Naprosyn and refer to orthopedics.  Patient also given Ace bandage and advised to ice and elevate.  We will restart patient on enalapril, however  half the dose and recheck at PCP. Blood pressure decreased in the ED. Return precautions discussed.  Patient understands and agrees with plan.  Patient discharged in satisfactory condition.  Final Clinical Impressions(s) / ED Diagnoses   Final diagnoses:  Osteoarthritis of left knee, unspecified osteoarthritis type    ED Discharge Orders        Ordered    enalapril (VASOTEC) 5 MG tablet  Daily     05/27/17 2135    naproxen (NAPROSYN) 375 MG tablet  2 times daily     05/27/17 2135       Frederica Kuster, PA-C 05/27/17 2137    Macarthur Critchley, MD 05/30/17 1322

## 2017-05-27 NOTE — ED Notes (Signed)
Pt departed in NAD.  

## 2017-05-27 NOTE — Discharge Instructions (Signed)
Take Naprosyn twice daily with food for your pain.  You can alternate with Tylenol as prescribed over-the-counter.  Use ice 3-4 times daily alternating 20 minutes on, 20 minutes off.  Please follow up with the orthopedic doctor below for further evaluation and treatment of your arthritis.  Please follow-up with your primary care doctor in the next few weeks for recheck of your blood pressure.

## 2017-08-13 ENCOUNTER — Ambulatory Visit: Payer: No Typology Code available for payment source | Admitting: Family Medicine

## 2017-08-27 ENCOUNTER — Encounter: Payer: Self-pay | Admitting: Family Medicine

## 2017-08-27 ENCOUNTER — Ambulatory Visit (INDEPENDENT_AMBULATORY_CARE_PROVIDER_SITE_OTHER): Payer: No Typology Code available for payment source | Admitting: Family Medicine

## 2017-08-27 VITALS — BP 140/78 | HR 89 | Temp 98.1°F | Ht 58.5 in | Wt 311.0 lb

## 2017-08-27 DIAGNOSIS — J4520 Mild intermittent asthma, uncomplicated: Secondary | ICD-10-CM

## 2017-08-27 DIAGNOSIS — Z90710 Acquired absence of both cervix and uterus: Secondary | ICD-10-CM

## 2017-08-27 DIAGNOSIS — R739 Hyperglycemia, unspecified: Secondary | ICD-10-CM | POA: Diagnosis not present

## 2017-08-27 DIAGNOSIS — I10 Essential (primary) hypertension: Secondary | ICD-10-CM

## 2017-08-27 DIAGNOSIS — M170 Bilateral primary osteoarthritis of knee: Secondary | ICD-10-CM | POA: Diagnosis not present

## 2017-08-27 DIAGNOSIS — E559 Vitamin D deficiency, unspecified: Secondary | ICD-10-CM

## 2017-08-27 LAB — CBC
HEMATOCRIT: 41.1 % (ref 36.0–46.0)
Hemoglobin: 13.5 g/dL (ref 12.0–15.0)
MCHC: 32.8 g/dL (ref 30.0–36.0)
MCV: 91.4 fl (ref 78.0–100.0)
Platelets: 251 10*3/uL (ref 150.0–400.0)
RBC: 4.5 Mil/uL (ref 3.87–5.11)
RDW: 14.9 % (ref 11.5–15.5)
WBC: 7.7 10*3/uL (ref 4.0–10.5)

## 2017-08-27 LAB — COMPREHENSIVE METABOLIC PANEL
ALBUMIN: 3.7 g/dL (ref 3.5–5.2)
ALK PHOS: 101 U/L (ref 39–117)
ALT: 14 U/L (ref 0–35)
AST: 13 U/L (ref 0–37)
BILIRUBIN TOTAL: 0.3 mg/dL (ref 0.2–1.2)
BUN: 8 mg/dL (ref 6–23)
CO2: 29 mEq/L (ref 19–32)
CREATININE: 0.62 mg/dL (ref 0.40–1.20)
Calcium: 8.8 mg/dL (ref 8.4–10.5)
Chloride: 101 mEq/L (ref 96–112)
GFR: 103 mL/min (ref >60.00)
GLUCOSE: 117 mg/dL — AB (ref 70–99)
POTASSIUM: 3.5 meq/L (ref 3.5–5.1)
SODIUM: 140 meq/L (ref 135–145)
TOTAL PROTEIN: 7.2 g/dL (ref 6.0–8.3)

## 2017-08-27 LAB — LIPID PANEL
CHOLESTEROL: 195 mg/dL (ref 0–200)
HDL: 62.1 mg/dL (ref >39.00)
LDL Cholesterol: 97 mg/dL (ref 0–99)
NONHDL: 133.35
Total CHOL/HDL Ratio: 3
Triglycerides: 181 mg/dL — ABNORMAL HIGH (ref 0.0–149.0)
VLDL: 36.2 mg/dL (ref 0.0–40.0)

## 2017-08-27 LAB — HEMOGLOBIN A1C: HEMOGLOBIN A1C: 5.8 % (ref 4.6–6.5)

## 2017-08-27 LAB — VITAMIN D 25 HYDROXY (VIT D DEFICIENCY, FRACTURES): VITD: 9.84 ng/mL — ABNORMAL LOW (ref 30.00–100.00)

## 2017-08-27 MED ORDER — ENALAPRIL MALEATE 5 MG PO TABS: 5.0000 mg | ORAL_TABLET | Freq: Every day | ORAL | 2 refills | Status: DC

## 2017-08-27 MED ORDER — CHOLECALCIFEROL 1.25 MG (50000 UT) PO TABS: ORAL_TABLET | ORAL | 1 refills | Status: DC

## 2017-08-27 MED ORDER — ALBUTEROL SULFATE HFA 108 (90 BASE) MCG/ACT IN AERS: 2.0000 | INHALATION_SPRAY | Freq: Four times a day (QID) | RESPIRATORY_TRACT | 2 refills | Status: DC | PRN

## 2017-08-27 NOTE — Assessment & Plan Note (Signed)
S:  Weight continues to be very elevated. Low motivation for change Wt Readings from Last 3 Encounters:  08/27/17 (!) 311 lb (141.1 kg)  10/02/15 (!) 315 lb 6.4 oz (143.1 kg)  04/03/15 295 lb (133.8 kg)  A/P: we discussed with her arthritis, hyperglycemia we really need to work on this. She admits desserts and sweets are problem areas- she agrees to work on cutting these down with goal weight loss at least 5 lbs by next visit. If not making good progress- can have her see Inda Coke, PA and dietician

## 2017-08-27 NOTE — Patient Instructions (Addendum)
Health Maintenance Due  Topic Date Due  . MAMMOGRAM - We will provide [facility] of [facility]out. 04/23/2017   We discussed blood pressure goal of <140/90. Restart enalapril 5mg  and recheck 4-8 weeks  She admits desserts and sweets are problem areas- she agrees to work on cutting these down with goal weight loss at least 5 lbs by next visit. If not making good progress- can have her see Inda Coke, PA and dietician   Please stop by the lab before you go.

## 2017-08-27 NOTE — Assessment & Plan Note (Addendum)
Patient had arthroscopic surgery years ago with [facility]. Didn't have best experience. Wants to get in with local orthopedist with new group- apparently has seen murphy wainer in past and enjoyed that visit- will refer back there. We discussed even more than surgery or injections- weight loss is going to be one of the key therapies for her knee.

## 2017-08-27 NOTE — Assessment & Plan Note (Signed)
S: has had increased risk of diabetes with a1c 5.9 In past but better last check Lab Results  Component Value Date   HGBA1C 5.6 04/03/2015  A/P: will check another cbg and a1c today

## 2017-08-27 NOTE — Assessment & Plan Note (Addendum)
S: controlled poorly on no rx. Had been on enalapril 5mg . Ran out last year.  BP Readings from Last 3 Encounters:  08/27/17 140/78  05/27/17 (!) 190/83  10/02/15 132/78  A/P: We discussed blood pressure goal of <140/90. Restart enalapril 5mg  and recheck 4-8 weeks  Will check lipids- mild LDL elevations in past

## 2017-08-27 NOTE — Assessment & Plan Note (Signed)
S: albuterol for rescue- only has had to use when really humid- infrequent use A/P: refill albuterol

## 2017-08-27 NOTE — Progress Notes (Signed)
Subjective:  Melissa Kim is a 62 y.o. year old very pleasant female patient who presents for/with See problem oriented charting ROS- has continued knee pain (was told bone on bone years ago), wheeze and shortness of breath if out in humidity- albuterol helps, no chest pain reported   Past Medical History-  Patient Active Problem List   Diagnosis Date Noted  . Morbid obesity ([place]) 11/30/2007    Priority: High  . History of uterine cancer 03/28/2014    Priority: Medium  . Hyperglycemia 03/28/2014    Priority: Medium  . Overactive bladder 09/24/2009    Priority: Medium  . Osteopenia 12/05/2008    Priority: Medium  . Essential hypertension 06/01/2007    Priority: Medium  . Asthma 09/29/2006    Priority: Medium  . Psoriasis 09/29/2006    Priority: Medium  . History of total hysterectomy 08/27/2017    Priority: Low  . Osteoarthritis, knee 03/28/2014    Priority: Low  . Vitamin D deficiency 11/20/2009    Priority: Low  . Venous (peripheral) insufficiency 09/29/2006    Priority: Low  . GERD 09/29/2006    Priority: Low   Medications- reviewed and updated Current Outpatient Medications  Medication Sig Dispense Refill  . albuterol (PROVENTIL HFA;VENTOLIN HFA) 108 (90 BASE) MCG/ACT inhaler Inhale 2 puffs into the lungs every 6 (six) hours as needed. For shortness of breath      No current facility-administered medications for this visit.     Objective: BP 140/78   Pulse 89   Temp 98.1 F (36.7 C) (Oral)   Ht 4' 10.5" (1.486 m)   Wt (!) 311 lb (141.1 kg)   SpO2 95%   BMI 63.89 kg/m  Gen: NAD, resting comfortably CV: RRR no murmurs rubs or gallops Lungs: CTAB no crackles, wheeze, rhonchi Abdomen: soft/nontender/nondistended/normal bowel sounds. Severe morbid obesity noted Ext: 1+ edema Skin: warm, dry Neuro: walking with walker now  Assessment/Plan:  Other notes: 1.stopped working December 2018  2. Off vitamin D- will check level  Essential hypertension S:  controlled poorly on no rx. Had been on enalapril 5mg . Ran out last year.  BP Readings from Last 3 Encounters:  08/27/17 140/78  05/27/17 (!) 190/83  10/02/15 132/78  A/P: We discussed blood pressure goal of <140/90. Restart enalapril 5mg  and recheck 4-8 weeks  Will check lipids- mild LDL elevations in past  Morbid obesity ([facility]) S:  Weight continues to be very elevated. Low motivation for change Wt Readings from Last 3 Encounters:  08/27/17 (!) 311 lb (141.1 kg)  10/02/15 (!) 315 lb 6.4 oz (143.1 kg)  04/03/15 295 lb (133.8 kg)  A/P: we discussed with her arthritis, hyperglycemia we really need to work on this. She admits desserts and sweets are problem areas- she agrees to work on cutting these down with goal weight loss at least 5 lbs by next visit. If not making good progress- can have her see Inda Coke, PA and dietician   Hyperglycemia S: has had increased risk of diabetes with a1c 5.9 In past but better last check Lab Results  Component Value Date   HGBA1C 5.6 04/03/2015  A/P: will check another cbg and a1c today  Asthma S: albuterol for rescue- only has had to use when really humid- infrequent use A/P: refill albuterol    Osteoarthritis, knee Patient had arthroscopic surgery years ago with [facility]. Didn't have best experience. Wants to get in with local orthopedist with new group- apparently has seen murphy wainer in past  and enjoyed that visit- will refer back there. We discussed even more than surgery or injections- weight loss is going to be one of the key therapies for her knee.   4-8 weeks  Lab/Order associations: Essential hypertension - Plan: CBC, Comprehensive metabolic panel, Lipid panel  Morbid obesity ([facility])  Hyperglycemia - Plan: Hemoglobin A1c  Mild intermittent asthma without complication  History of total hysterectomy  Primary osteoarthritis of both knees - Plan: Ambulatory referral to Orthopedics  Vitamin D deficiency - Plan: VITAMIN D 25  Hydroxy (Vit-D Deficiency, Fractures)  Meds ordered this encounter  Medications  . enalapril (VASOTEC) 5 MG tablet    Sig: Take 1 tablet (5 mg total) by mouth daily.    Dispense:  90 tablet    Refill:  2  . albuterol (PROVENTIL HFA;VENTOLIN HFA) 108 (90 Base) MCG/ACT inhaler    Sig: Inhale 2 puffs into the lungs every 6 (six) hours as needed. For shortness of breath    Dispense:  1 Inhaler    Refill:  2   Return precautions advised.  Garret Reddish, MD

## 2017-08-27 NOTE — Addendum Note (Signed)
Addended by: Marin Olp on: 08/27/2017 08:35 PM   Modules accepted: Orders

## 2017-10-23 ENCOUNTER — Ambulatory Visit: Payer: No Typology Code available for payment source | Admitting: Family Medicine

## 2017-10-29 ENCOUNTER — Ambulatory Visit (INDEPENDENT_AMBULATORY_CARE_PROVIDER_SITE_OTHER): Payer: No Typology Code available for payment source | Admitting: Family Medicine

## 2017-10-29 ENCOUNTER — Encounter: Payer: Self-pay | Admitting: Family Medicine

## 2017-10-29 VITALS — BP 130/80 | HR 78 | Temp 98.3°F | Ht 58.5 in | Wt 306.5 lb

## 2017-10-29 DIAGNOSIS — Z23 Encounter for immunization: Secondary | ICD-10-CM

## 2017-10-29 DIAGNOSIS — I10 Essential (primary) hypertension: Secondary | ICD-10-CM | POA: Diagnosis not present

## 2017-10-29 NOTE — Progress Notes (Signed)
Subjective:  Kenesha Moshier is a 62 y.o. year old very pleasant female patient who presents for/with See problem oriented charting ROS- continued pain with walking, walks with walker, no chest pain reported. Edema she states actually slightly improved   Past Medical History-  Patient Active Problem List   Diagnosis Date Noted  . Morbid obesity ([place]) 11/30/2007    Priority: High  . History of uterine cancer 03/28/2014    Priority: Medium  . Hyperglycemia 03/28/2014    Priority: Medium  . Overactive bladder 09/24/2009    Priority: Medium  . Osteopenia 12/05/2008    Priority: Medium  . Essential hypertension 06/01/2007    Priority: Medium  . Asthma 09/29/2006    Priority: Medium  . Psoriasis 09/29/2006    Priority: Medium  . History of total hysterectomy 08/27/2017    Priority: Low  . Osteoarthritis, knee 03/28/2014    Priority: Low  . Vitamin D deficiency 11/20/2009    Priority: Low  . Venous (peripheral) insufficiency 09/29/2006    Priority: Low  . GERD 09/29/2006    Priority: Low    Medications- reviewed and updated Current Outpatient Medications  Medication Sig Dispense Refill  . acetaminophen (TYLENOL) 500 MG tablet Take 2,000 mg by mouth as needed.    Marland Kitchen albuterol (PROVENTIL HFA;VENTOLIN HFA) 108 (90 Base) MCG/ACT inhaler Inhale 2 puffs into the lungs every 6 (six) hours as needed. For shortness of breath 1 Inhaler 2  . Cholecalciferol 50000 units TABS 50,000 units PO qwk for 12 weeks. 12 tablet 1  . enalapril (VASOTEC) 5 MG tablet Take 1 tablet (5 mg total) by mouth daily. 90 tablet 2   No current facility-administered medications for this visit.     Objective: BP 130/80   Pulse 78   Temp 98.3 F (36.8 C) (Oral)   Ht 4' 10.5" (1.486 m)   Wt (!) 306 lb 8 oz (139 kg)   SpO2 95%   BMI 62.97 kg/m  Gen: NAD, resting comfortably CV: RRR no murmurs rubs or gallops Lungs: CTAB no crackles, wheeze, rhonchi Abdomen: soft/nontender/nondistended/normal bowel  sounds. Profound obesity Ext: mild pitting. Chronic venous stasis changes and thickened skin Skin: warm, dry, nickel sized raised mobile lesion on left scalp  Assessment/Plan:  Other notes: 1.cyst on scalp at least nickel size- advised derm evaluation as she states has grown some- she declines for now but will let me know if changes her mind   Essential hypertension S: controlled now back on enalapril 5 mg  BP Readings from Last 3 Encounters:  10/29/17 130/80  08/27/17 140/78  05/27/17 (!) 190/83  A/P: We discussed blood pressure goal of <140/90. Continue current meds:  Needs thigh cuff for accurate readings  Morbid obesity ([place]) S: Patient has lost 5 lbs- which was her goal from last visit- she is trying to cut down on intake though doesn't always make best choices still Wt Readings from Last 3 Encounters:  10/29/17 (!) 306 lb 8 oz (139 kg)  08/27/17 (!) 311 lb (141.1 kg)  10/02/15 (!) 315 lb 6.4 oz (143.1 kg)  A/P: thrilled with weight loss and encouraged continued slow steady weight loss   Future Appointments  Date Time Provider [place]  04/29/2018 10:45 AM Marin Olp, MD [facility]   Lab/Order associations: Need for prophylactic vaccination and inoculation against influenza - Plan: Flu Vaccine QUAD 36+ mos IM  Essential hypertension  Hyperglycemia  Morbid obesity ([place])  Return precautions advised.  Garret Reddish, MD

## 2017-10-29 NOTE — Assessment & Plan Note (Signed)
S: controlled now back on enalapril 5 mg  BP Readings from Last 3 Encounters:  10/29/17 130/80  08/27/17 140/78  05/27/17 (!) 190/83  A/P: We discussed blood pressure goal of <140/90. Continue current meds:  Needs thigh cuff for accurate readings

## 2017-10-29 NOTE — Assessment & Plan Note (Signed)
S: Patient has lost 5 lbs- which was her goal from last visit- she is trying to cut down on intake though doesn't always make best choices still Wt Readings from Last 3 Encounters:  10/29/17 (!) 306 lb 8 oz (139 kg)  08/27/17 (!) 311 lb (141.1 kg)  10/02/15 (!) 315 lb 6.4 oz (143.1 kg)  A/P: thrilled with weight loss and encouraged continued slow steady weight loss

## 2017-10-29 NOTE — Patient Instructions (Addendum)
Health Maintenance Due  Topic Date Due  . MAMMOGRAM - please please pleaseeeee call and get this scheduled! You agreed to do this today 04/23/2017    Blood pressure looks much better back on enalapril- please let us know before you run out as we do not want you to run out  [place] job on losing 5 lbs! Lets set a goal of another 5 lbs within 3-6 months- see me back in 3-6 months

## 2018-04-29 ENCOUNTER — Ambulatory Visit: Payer: No Typology Code available for payment source | Admitting: Family Medicine

## 2018-05-06 ENCOUNTER — Encounter: Payer: Self-pay | Admitting: Gastroenterology

## 2018-08-19 ENCOUNTER — Ambulatory Visit (INDEPENDENT_AMBULATORY_CARE_PROVIDER_SITE_OTHER): Payer: PPO | Admitting: Family Medicine

## 2018-08-19 ENCOUNTER — Encounter: Payer: Self-pay | Admitting: Family Medicine

## 2018-08-19 ENCOUNTER — Other Ambulatory Visit: Payer: Self-pay

## 2018-08-19 VITALS — BP 130/86 | HR 84 | Temp 98.4°F | Ht 58.5 in | Wt 329.6 lb

## 2018-08-19 DIAGNOSIS — R739 Hyperglycemia, unspecified: Secondary | ICD-10-CM

## 2018-08-19 DIAGNOSIS — J4520 Mild intermittent asthma, uncomplicated: Secondary | ICD-10-CM

## 2018-08-19 DIAGNOSIS — Z1211 Encounter for screening for malignant neoplasm of colon: Secondary | ICD-10-CM

## 2018-08-19 DIAGNOSIS — E559 Vitamin D deficiency, unspecified: Secondary | ICD-10-CM

## 2018-08-19 DIAGNOSIS — I10 Essential (primary) hypertension: Secondary | ICD-10-CM

## 2018-08-19 DIAGNOSIS — R635 Abnormal weight gain: Secondary | ICD-10-CM

## 2018-08-19 DIAGNOSIS — E039 Hypothyroidism, unspecified: Secondary | ICD-10-CM | POA: Diagnosis not present

## 2018-08-19 LAB — LIPID PANEL
Cholesterol: 192 mg/dL (ref 0–200)
HDL: 70.4 mg/dL (ref >39.00)
LDL Cholesterol: 101 mg/dL — ABNORMAL HIGH (ref 0–99)
NonHDL: 122.09
Total CHOL/HDL Ratio: 3
Triglycerides: 104 mg/dL (ref 0.0–149.0)
VLDL: 20.8 mg/dL (ref 0.0–40.0)

## 2018-08-19 LAB — COMPREHENSIVE METABOLIC PANEL
ALT: 13 U/L (ref 0–35)
AST: 12 U/L (ref 0–37)
Albumin: 3.8 g/dL (ref 3.5–5.2)
Alkaline Phosphatase: 90 U/L (ref 39–117)
BUN: 13 mg/dL (ref 6–23)
CO2: 30 mEq/L (ref 19–32)
Calcium: 8.8 mg/dL (ref 8.4–10.5)
Chloride: 101 mEq/L (ref 96–112)
Creatinine, Ser: 0.83 mg/dL (ref 0.40–1.20)
GFR: 69 mL/min (ref >60.00)
Glucose, Bld: 99 mg/dL (ref 70–99)
Potassium: 4.1 mEq/L (ref 3.5–5.1)
Sodium: 140 mEq/L (ref 135–145)
Total Bilirubin: 0.7 mg/dL (ref 0.2–1.2)
Total Protein: 6.7 g/dL (ref 6.0–8.3)

## 2018-08-19 LAB — TSH: TSH: 4.74 u[IU]/mL — ABNORMAL HIGH (ref 0.35–4.50)

## 2018-08-19 LAB — VITAMIN D 25 HYDROXY (VIT D DEFICIENCY, FRACTURES): VITD: 15.9 ng/mL — ABNORMAL LOW (ref 30.00–100.00)

## 2018-08-19 LAB — HEMOGLOBIN A1C: Hgb A1c MFr Bld: 5.8 % (ref 4.6–6.5)

## 2018-08-19 MED ORDER — LEVOTHYROXINE SODIUM 50 MCG PO TABS: 50.0000 ug | ORAL_TABLET | Freq: Every day | ORAL | 3 refills | Status: DC

## 2018-08-19 MED ORDER — VITAMIN D (ERGOCALCIFEROL) 1.25 MG (50000 UNIT) PO CAPS: 50000.0000 [IU] | ORAL_CAPSULE | ORAL | 1 refills | Status: DC

## 2018-08-19 MED ORDER — ALBUTEROL SULFATE HFA 108 (90 BASE) MCG/ACT IN AERS: 2.0000 | INHALATION_SPRAY | Freq: Four times a day (QID) | RESPIRATORY_TRACT | 5 refills | Status: DC | PRN

## 2018-08-19 NOTE — Addendum Note (Signed)
Addended by: Marin Olp on: 08/19/2018 11:34 PM   Modules accepted: Orders

## 2018-08-19 NOTE — Progress Notes (Signed)
Phone [phone]   Subjective:  Melissa Kim is a 63 y.o. year old very pleasant female patient who presents for/with See problem oriented charting Chief Complaint  Patient presents with  . Hypertension   ROS- no chest pain. Stable shortness of breath. Stable swelling. No fever/chills/cough.    Past Medical History-  Patient Active Problem List   Diagnosis Date Noted  . Morbid obesity ([place]) 11/30/2007    Priority: High  . History of uterine cancer 03/28/2014    Priority: Medium  . Hyperglycemia 03/28/2014    Priority: Medium  . Overactive bladder 09/24/2009    Priority: Medium  . Osteopenia 12/05/2008    Priority: Medium  . Essential hypertension 06/01/2007    Priority: Medium  . Asthma 09/29/2006    Priority: Medium  . Psoriasis 09/29/2006    Priority: Medium  . History of total hysterectomy 08/27/2017    Priority: Low  . Osteoarthritis, knee 03/28/2014    Priority: Low  . Vitamin D deficiency 11/20/2009    Priority: Low  . Venous (peripheral) insufficiency 09/29/2006    Priority: Low  . GERD 09/29/2006    Priority: Low    Medications- reviewed and updated Current Outpatient Medications  Medication Sig Dispense Refill  . acetaminophen (TYLENOL) 500 MG tablet Take 2,000 mg by mouth as needed.    Marland Kitchen albuterol (PROVENTIL HFA;VENTOLIN HFA) 108 (90 Base) MCG/ACT inhaler Inhale 2 puffs into the lungs every 6 (six) hours as needed. For shortness of breath 1 Inhaler 2  . enalapril (VASOTEC) 5 MG tablet Take 1 tablet (5 mg total) by mouth daily. 90 tablet 2   No current facility-administered medications for this visit.      Objective:  BP 130/86 (BP Location: Left Wrist, Patient Position: Sitting)   Pulse 84   Temp 98.4 F (36.9 C) (Oral)   Ht 4' 10.5" (1.486 m)   Wt (!) 329 lb 9.6 oz (149.5 kg)   SpO2 94%   BMI 67.71 kg/m  Gen: NAD, resting comfortably CV: RRR no murmurs rubs or gallops Lungs: CTAB no crackles, wheeze, rhonchi Abdomen:  soft/nontender/morbidly obese Ext: 1+ edema with venous stasis changes Skin: warm, dry Neuro: grossly normal, moves all extremities    Assessment and Plan   #hypertension S: controlled on Enalapril 5 mg daily.  BP Readings from Last 3 Encounters:  08/19/18 130/86  10/29/17 130/80  08/27/17 140/78  A/P: Stable. Continue current medications.  - lipids were ok last July- will update with weight gain- also on new insurance  # Asthma S:Albuterol HFA prn- uses every few weeks A/P: stable with sparing albuterol- refill today   # Obesity-morbid/hyperglycemia/prediabetes S:Unfortunately weight has trended up even more over the last year. Dental issues and cant chew food and does a lot of liquid calories- does a lot of soft drinks. Also does little binges on chocolate Wt Readings from Last 3 Encounters:  08/19/18 (!) 329 lb 9.6 oz (149.5 kg)  10/29/17 (!) 306 lb 8 oz (139 kg)  08/27/17 (!) 311 lb (141.1 kg)  A/P: for obesity- offered referral to healthy weigh to wellness or Inda Coke- she declines at this time.  For hyperglycemia- update a1c States doesn't like to drink water- bad experience with contaminated water at [employer]. Doesn't like seltzer water. Encouraged to try to find ways to make her tolerate water  - check TSH with weight gain   # Vitamin D Deficiency S:Rx Vit D supplement in the past at 50k units for 6 months- will  update as has been off supplement for about 6 months A/P: hopefully stable- update vitamin D      No problem-specific Assessment & Plan notes found for this encounter.   No future appointments. No follow-ups on file.  Lab/Order associations:   ICD-10-CM   1. Screening for colon cancer  Z12.11 Ambulatory referral to Gastroenterology    No orders of the defined types were placed in this encounter.   Return precautions advised.  Garret Reddish, MD

## 2018-08-19 NOTE — Patient Instructions (Addendum)
Health Maintenance Due  Topic Date Due  . MAMMOGRAM PT will schedule appt. Was waiting on insurance to "kick in"  04/23/2017  . COLONOSCOPY order placed- We will call you within two weeks about your referral to GI. If you do not hear within 3 weeks, give [place] a call.   04/27/2018   Try small healthy but regular meals instead of once a day. Please please please cut out the sodas- those Laci enemy #1 right now!   Consider the healthy weight to wellness program or a visit with Inda Coke, PA and RD in our clinic  3-4 month follow up- and get flu shot at that time. Doing it closer this time to try to get more aggressive about weight changes. I want you to set a goal of at least 5 lbs down by next visit- you can do this and cutting out sugar sweetened beverages can get you there!

## 2018-08-23 ENCOUNTER — Encounter: Payer: Self-pay | Admitting: Gastroenterology

## 2018-09-10 ENCOUNTER — Telehealth: Payer: Self-pay

## 2018-09-10 NOTE — Telephone Encounter (Signed)
Dr.Jacobs,  In Pre-Visit I was preparing Melissa Kim's chart and saw that her BMI is 67.71. She is a recall and had a colonoscopy in 2010. At that time her BMI was over 50, but somehow she managed to have her procedure at the [facility]. Please advise if you would like to see her in the office or a direct [facility] procedure. Thanks!  Riki Sheer, LPN ( [facility] )

## 2018-09-12 NOTE — Telephone Encounter (Signed)
Absolutely office visit first.  Thanks

## 2018-09-13 NOTE — Telephone Encounter (Signed)
Patient is scheduled for an OV with Dr. Ardis Hughs on 10/13/18 @ 11:10 Am. Patient is aware that PV and colonoscopy that is scheduled 10/04/18 is cancelled and patient is aware that she will need to have a hospital procedure unless other options are discussed at [facility].   Riki Sheer, LPN ( PV )

## 2018-09-13 NOTE — Telephone Encounter (Signed)
Error

## 2018-09-24 NOTE — Addendum Note (Signed)
Addended by: Gwenyth Ober R on: 09/24/2018 11:56 AM   Modules accepted: Orders

## 2018-09-27 ENCOUNTER — Other Ambulatory Visit (INDEPENDENT_AMBULATORY_CARE_PROVIDER_SITE_OTHER): Payer: PPO

## 2018-09-27 ENCOUNTER — Other Ambulatory Visit: Payer: Self-pay

## 2018-09-27 DIAGNOSIS — E039 Hypothyroidism, unspecified: Secondary | ICD-10-CM | POA: Diagnosis not present

## 2018-09-27 LAB — TSH: TSH: 4.03 u[IU]/mL (ref 0.35–4.50)

## 2018-09-28 ENCOUNTER — Telehealth: Payer: Self-pay

## 2018-09-28 NOTE — Telephone Encounter (Signed)
-----   Message from Marin Olp, MD sent at 09/27/2018 10:40 PM EDT ----- Thyroid now in normal range-continue current medication

## 2018-10-04 ENCOUNTER — Encounter: Payer: PPO | Admitting: Gastroenterology

## 2018-10-13 ENCOUNTER — Ambulatory Visit: Payer: PPO | Admitting: Gastroenterology

## 2018-10-13 ENCOUNTER — Encounter: Payer: Self-pay | Admitting: Gastroenterology

## 2018-10-13 VITALS — BP 134/84 | HR 74 | Temp 98.1°F | Ht 58.5 in | Wt 327.8 lb

## 2018-10-13 DIAGNOSIS — K625 Hemorrhage of anus and rectum: Secondary | ICD-10-CM

## 2018-10-13 DIAGNOSIS — Z86010 Personal history of colonic polyps: Secondary | ICD-10-CM

## 2018-10-13 MED ORDER — SUPREP BOWEL PREP KIT 17.5-3.13-1.6 GM/177ML PO SOLN: 1.0000 | ORAL | 0 refills | Status: DC

## 2018-10-13 MED ORDER — PEG 3350-KCL-NA BICARB-NACL 420 G PO SOLR: 4000.0000 mL | ORAL | 0 refills | Status: DC

## 2018-10-13 NOTE — Progress Notes (Signed)
Review of pertinent gastrointestinal problems: 1. history of pre-cancerous (adenomatous) colon polyps (pathologically proven adenomatous polyps removed in 2000 and 2002 by SML, 2005 colonoscopy showed no polyps).  Colonoscopy March 2010 found diverticulosis throughout the colon, no polyps or cancers.   HPI: This is a very pleasant 63 year old woman whom I last saw the time of a 2010 colonoscopy.  She is results summarized above  Chief complaint is minor rectal bleeding, history of colon polyps, massive obesity  For 3 to 4 weeks she has had some minor rectal bleeding.  It is bright red blood, it occurs only when she moves her bowels, it happens once or twice a week only.  She has no rectal discomfort.  She has been gaining weight, probably 20 pounds in the past 6 months.  She walks with a walker due to her massive obesity with a BMI of 67  No serious abdominal pains   Review of systems: Pertinent positive and negative review of systems were noted in the above HPI section. All other review negative.   Past Medical History:  Diagnosis Date  . Asthma   . Cancer ([place])   . DIVERTICULITIS, HX OF 05/30/2008  . Diverticulosis   . DIVERTICULOSIS, COLON 05/30/2008  . Fx foot bone NEC-closed   . GERD (gastroesophageal reflux disease)   . Hypertension   . Obesity   . Psoriasis   . STRESS FRACTURE, FOOT 12/05/2008   reports multiple  . Venous stasis dermatitis     Past Surgical History:  Procedure Laterality Date  . ABDOMINAL HYSTERECTOMY     complete reported including cervix. For uterine cancer. released by GYN.   Marland Kitchen APPENDECTOMY    . arthroscopic of knee    . CHOLECYSTECTOMY    . TONSILLECTOMY      Current Outpatient Medications  Medication Sig Dispense Refill  . acetaminophen (TYLENOL) 500 MG tablet Take 2,000 mg by mouth as needed.    Marland Kitchen albuterol (VENTOLIN HFA) 108 (90 Base) MCG/ACT inhaler Inhale 2 puffs into the lungs every 6 (six) hours as needed. For shortness of breath 18 g 5   . levothyroxine (SYNTHROID) 50 MCG tablet Take 1 tablet (50 mcg total) by mouth daily. 90 tablet 3  . Vitamin D, Ergocalciferol, (DRISDOL) 1.25 MG (50000 UT) CAPS capsule Take 1 capsule (50,000 Units total) by mouth every 7 (seven) days. 12 capsule 1   No current facility-administered medications for this visit.     Allergies as of 10/13/2018 - Review Complete 10/13/2018  Allergen Reaction Noted  . Aspirin    . Ibuprofen    . Losartan potassium-hctz    . Peg 3350-electrolytes  04/11/2008  . Strawberry extract  05/23/2013    Family History  Problem Relation Age of Onset  . Heart disease Father        MI in late 26s, early 97s, smoker  . Hypertension Mother   . Pancreatic cancer Mother     Social History   Socioeconomic History  . Marital status: Married    Spouse name: Not on file  . Number of children: Not on file  . Years of education: Not on file  . Highest education level: Not on file  Occupational History  . Not on file  Social Needs  . Financial resource strain: Not on file  . Food insecurity    Worry: Not on file    Inability: Not on file  . Transportation needs    Medical: Not on file    Non-medical: Not  on file  Tobacco Use  . Smoking status: Never Smoker  . Smokeless tobacco: Never Used  Substance and Sexual Activity  . Alcohol use: No  . Drug use: No  . Sexual activity: Not on file  Lifestyle  . Physical activity    Days per week: Not on file    Minutes per session: Not on file  . Stress: Not on file  Relationships  . Social Herbalist on phone: Not on file    Gets together: Not on file    Attends religious service: Not on file    Active member of club or organization: Not on file    Attends meetings of clubs or organizations: Not on file    Relationship status: Not on file  . Intimate partner violence    Fear of current or ex partner: Not on file    Emotionally abused: Not on file    Physically abused: Not on file    Forced  sexual activity: Not on file  Other Topics Concern  . Not on file  Social History Narrative   Married (patient of Dr. 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, CPU games     Physical Exam: BP 134/84 (BP Location: Left Arm, Patient Position: Sitting)   Pulse 74   Temp 98.1 F (36.7 C)   Ht 4' 10.5" (1.486 m)   Wt (!) 327 lb 12.8 oz (148.7 kg)   SpO2 98%   BMI 67.34 kg/m  Constitutional: Massive obesity, BMI of 67, walks with a walker Psychiatric: alert and oriented x3 Eyes: extraocular movements intact Mouth: oral pharynx moist, no lesions Neck: supple no lymphadenopathy Cardiovascular: heart regular rate and rhythm Lungs: clear to auscultation bilaterally Abdomen: soft, nontender, nondistended, no obvious ascites, no peritoneal signs, normal bowel sounds Extremities: no lower extremity edema bilaterally Skin: no lesions on visible extremities   Assessment and plan: 63 y.o. female with minor rectal bleeding, history of adenomatous polyps, massive obesity BMI 67  I recommend a colonoscopy at her soonest convenience.  She understands she is at increased risk given her massive obesity with a BMI of 67 so it is safest that we do this at the hospital for her.  I see no reason for any further blood tests or imaging studies prior to then.  Please see the "Patient Instructions" section for addition details about the plan.   Owens Loffler, MD [facility] 10/13/2018, 11:33 AM  Cc: Marin Olp, MD

## 2018-10-13 NOTE — Patient Instructions (Signed)
You have been scheduled for a colonoscopy. Please follow written instructions given to you at your visit today.  Please pick up your prep supplies at the pharmacy within the next 1-3 days. If you use inhalers (even only as needed), please bring them with you on the day of your procedure. Your physician has requested that you go to www.[email or web address].com and enter the access code given to you at your visit today. This web site gives a general overview about your procedure. However, you should still follow specific instructions given to you by our office regarding your preparation for the procedure.  Thank you for entrusting me with your care and choosing [facility].  Dr Jacobs  

## 2018-10-13 NOTE — H&P (View-Only) (Signed)
Review of pertinent gastrointestinal problems: 1. history of pre-cancerous (adenomatous) colon polyps (pathologically proven adenomatous polyps removed in 2000 and 2002 by SML, 2005 colonoscopy showed no polyps).  Colonoscopy March 2010 found diverticulosis throughout the colon, no polyps or cancers.   HPI: This is a very pleasant 63 year old woman whom I last saw the time of a 2010 colonoscopy.  She is results summarized above  Chief complaint is minor rectal bleeding, history of colon polyps, massive obesity  For 3 to 4 weeks she has had some minor rectal bleeding.  It is bright red blood, it occurs only when she moves her bowels, it happens once or twice a week only.  She has no rectal discomfort.  She has been gaining weight, probably 20 pounds in the past 6 months.  She walks with a walker due to her massive obesity with a BMI of 67  No serious abdominal pains   Review of systems: Pertinent positive and negative review of systems were noted in the above HPI section. All other review negative.   Past Medical History:  Diagnosis Date  . Asthma   . Cancer ([place])   . DIVERTICULITIS, HX OF 05/30/2008  . Diverticulosis   . DIVERTICULOSIS, COLON 05/30/2008  . Fx foot bone NEC-closed   . GERD (gastroesophageal reflux disease)   . Hypertension   . Obesity   . Psoriasis   . STRESS FRACTURE, FOOT 12/05/2008   reports multiple  . Venous stasis dermatitis     Past Surgical History:  Procedure Laterality Date  . ABDOMINAL HYSTERECTOMY     complete reported including cervix. For uterine cancer. released by GYN.   Marland Kitchen APPENDECTOMY    . arthroscopic of knee    . CHOLECYSTECTOMY    . TONSILLECTOMY      Current Outpatient Medications  Medication Sig Dispense Refill  . acetaminophen (TYLENOL) 500 MG tablet Take 2,000 mg by mouth as needed.    Marland Kitchen albuterol (VENTOLIN HFA) 108 (90 Base) MCG/ACT inhaler Inhale 2 puffs into the lungs every 6 (six) hours as needed. For shortness of breath 18 g 5   . levothyroxine (SYNTHROID) 50 MCG tablet Take 1 tablet (50 mcg total) by mouth daily. 90 tablet 3  . Vitamin D, Ergocalciferol, (DRISDOL) 1.25 MG (50000 UT) CAPS capsule Take 1 capsule (50,000 Units total) by mouth every 7 (seven) days. 12 capsule 1   No current facility-administered medications for this visit.     Allergies as of 10/13/2018 - Review Complete 10/13/2018  Allergen Reaction Noted  . Aspirin    . Ibuprofen    . Losartan potassium-hctz    . Peg 3350-electrolytes  04/11/2008  . Strawberry extract  05/23/2013    Family History  Problem Relation Age of Onset  . Heart disease Father        MI in late 64s, early 52s, smoker  . Hypertension Mother   . Pancreatic cancer Mother     Social History   Socioeconomic History  . Marital status: Married    Spouse name: Not on file  . Number of children: Not on file  . Years of education: Not on file  . Highest education level: Not on file  Occupational History  . Not on file  Social Needs  . Financial resource strain: Not on file  . Food insecurity    Worry: Not on file    Inability: Not on file  . Transportation needs    Medical: Not on file    Non-medical: Not  on file  Tobacco Use  . Smoking status: Never Smoker  . Smokeless tobacco: Never Used  Substance and Sexual Activity  . Alcohol use: No  . Drug use: No  . Sexual activity: Not on file  Lifestyle  . Physical activity    Days per week: Not on file    Minutes per session: Not on file  . Stress: Not on file  Relationships  . Social Herbalist on phone: Not on file    Gets together: Not on file    Attends religious service: Not on file    Active member of club or organization: Not on file    Attends meetings of clubs or organizations: Not on file    Relationship status: Not on file  . Intimate partner violence    Fear of current or ex partner: Not on file    Emotionally abused: Not on file    Physically abused: Not on file    Forced  sexual activity: Not on file  Other Topics Concern  . Not on file  Social History Narrative   Married (patient of Dr. 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, CPU games     Physical Exam: BP 134/84 (BP Location: Left Arm, Patient Position: Sitting)   Pulse 74   Temp 98.1 F (36.7 C)   Ht 4' 10.5" (1.486 m)   Wt (!) 327 lb 12.8 oz (148.7 kg)   SpO2 98%   BMI 67.34 kg/m  Constitutional: Massive obesity, BMI of 67, walks with a walker Psychiatric: alert and oriented x3 Eyes: extraocular movements intact Mouth: oral pharynx moist, no lesions Neck: supple no lymphadenopathy Cardiovascular: heart regular rate and rhythm Lungs: clear to auscultation bilaterally Abdomen: soft, nontender, nondistended, no obvious ascites, no peritoneal signs, normal bowel sounds Extremities: no lower extremity edema bilaterally Skin: no lesions on visible extremities   Assessment and plan: 63 y.o. female with minor rectal bleeding, history of adenomatous polyps, massive obesity BMI 67  I recommend a colonoscopy at her soonest convenience.  She understands she is at increased risk given her massive obesity with a BMI of 67 so it is safest that we do this at the hospital for her.  I see no reason for any further blood tests or imaging studies prior to then.  Please see the "Patient Instructions" section for addition details about the plan.   Owens Loffler, MD [facility] 10/13/2018, 11:33 AM  Cc: Marin Olp, MD

## 2018-11-01 ENCOUNTER — Encounter (HOSPITAL_COMMUNITY): Payer: Self-pay | Admitting: *Deleted

## 2018-11-01 ENCOUNTER — Other Ambulatory Visit (HOSPITAL_COMMUNITY)
Admission: RE | Admit: 2018-11-01 | Discharge: 2018-11-01 | Disposition: A | Discharge status: Home / Self Care | Payer: PPO | Source: Ambulatory Visit | Attending: Gastroenterology | Admitting: Gastroenterology

## 2018-11-01 ENCOUNTER — Other Ambulatory Visit: Payer: Self-pay

## 2018-11-01 ENCOUNTER — Other Ambulatory Visit (HOSPITAL_COMMUNITY): Payer: Self-pay

## 2018-11-01 DIAGNOSIS — Z20828 Contact with and (suspected) exposure to other viral communicable diseases: Secondary | ICD-10-CM | POA: Insufficient documentation

## 2018-11-01 DIAGNOSIS — Z01812 Encounter for preprocedural laboratory examination: Secondary | ICD-10-CM | POA: Insufficient documentation

## 2018-11-02 LAB — NOVEL CORONAVIRUS, NAA (HOSP ORDER, SEND-OUT TO REF LAB; TAT 18-24 HRS): SARS-CoV-2, NAA: NOT DETECTED

## 2018-11-03 NOTE — Anesthesia Preprocedure Evaluation (Addendum)
Anesthesia Evaluation  Patient identified by MRN, date of birth, ID band Patient awake    Reviewed: Allergy & Precautions, NPO status , Patient's Chart, lab work & pertinent test results  History of Anesthesia Complications (+) Family history of anesthesia reaction  Airway Mallampati: II  TM Distance: >3 FB Neck ROM: Full    Dental no notable dental hx. (+) Teeth Intact   Pulmonary asthma ,    Pulmonary exam normal breath sounds clear to auscultation       Cardiovascular hypertension, Pt. on medications Normal cardiovascular exam Rhythm:Regular Rate:Normal     Neuro/Psych negative neurological ROS  negative psych ROS   GI/Hepatic Neg liver ROS, GERD  ,  Endo/Other  negative endocrine ROS  Renal/GU negative Renal ROS     Musculoskeletal   Abdominal (+) + obese,   Peds  Hematology negative hematology ROS (+)   Anesthesia Other Findings   Reproductive/Obstetrics                            Anesthesia Physical Anesthesia Plan  ASA: IV  Anesthesia Plan: MAC   Post-op Pain Management:    Induction: Intravenous  PONV Risk Score and Plan: Treatment may vary due to age or medical condition  Airway Management Planned: Natural Airway and Nasal Cannula  Additional Equipment:   Intra-op Plan:   Post-operative Plan:   Informed Consent: I have reviewed the patients History and Physical, chart, labs and discussed the procedure including the risks, benefits and alternatives for the proposed anesthesia with the patient or authorized representative who has indicated his/her understanding and acceptance.     Dental advisory given  Plan Discussed with:   Anesthesia Plan Comments: (Colon Polyps/ rectal bleeding   For colonoscopy w MAc)       Anesthesia Quick Evaluation

## 2018-11-04 ENCOUNTER — Ambulatory Visit (HOSPITAL_COMMUNITY)
Admission: RE | Admit: 2018-11-04 | Discharge: 2018-11-04 | Disposition: A | Discharge status: Home / Self Care | Payer: PPO | Attending: Gastroenterology | Admitting: Gastroenterology

## 2018-11-04 ENCOUNTER — Ambulatory Visit (HOSPITAL_COMMUNITY): Payer: PPO | Admitting: Anesthesiology

## 2018-11-04 ENCOUNTER — Other Ambulatory Visit: Payer: Self-pay

## 2018-11-04 ENCOUNTER — Encounter (HOSPITAL_COMMUNITY): Payer: Self-pay | Admitting: *Deleted

## 2018-11-04 ENCOUNTER — Encounter (HOSPITAL_COMMUNITY)
Admission: RE | Disposition: A | Discharge status: Home / Self Care | Payer: Self-pay | Source: Home / Self Care | Attending: Gastroenterology

## 2018-11-04 DIAGNOSIS — Z6841 Body Mass Index (BMI) 40.0 and over, adult: Secondary | ICD-10-CM | POA: Insufficient documentation

## 2018-11-04 DIAGNOSIS — Z86010 Personal history of colonic polyps: Secondary | ICD-10-CM | POA: Insufficient documentation

## 2018-11-04 DIAGNOSIS — K5730 Diverticulosis of large intestine without perforation or abscess without bleeding: Secondary | ICD-10-CM | POA: Insufficient documentation

## 2018-11-04 DIAGNOSIS — Z79890 Hormone replacement therapy (postmenopausal): Secondary | ICD-10-CM | POA: Insufficient documentation

## 2018-11-04 DIAGNOSIS — Z1211 Encounter for screening for malignant neoplasm of colon: Secondary | ICD-10-CM | POA: Diagnosis not present

## 2018-11-04 DIAGNOSIS — K625 Hemorrhage of anus and rectum: Secondary | ICD-10-CM | POA: Diagnosis not present

## 2018-11-04 DIAGNOSIS — I10 Essential (primary) hypertension: Secondary | ICD-10-CM | POA: Diagnosis not present

## 2018-11-04 DIAGNOSIS — E669 Obesity, unspecified: Secondary | ICD-10-CM | POA: Insufficient documentation

## 2018-11-04 DIAGNOSIS — J45909 Unspecified asthma, uncomplicated: Secondary | ICD-10-CM | POA: Diagnosis not present

## 2018-11-04 DIAGNOSIS — K5790 Diverticulosis of intestine, part unspecified, without perforation or abscess without bleeding: Secondary | ICD-10-CM | POA: Diagnosis not present

## 2018-11-04 DIAGNOSIS — K219 Gastro-esophageal reflux disease without esophagitis: Secondary | ICD-10-CM | POA: Diagnosis not present

## 2018-11-04 HISTORY — PX: COLONOSCOPY WITH PROPOFOL: SHX5780

## 2018-11-04 HISTORY — DX: Other complications of anesthesia, initial encounter: T88.59XA

## 2018-11-04 HISTORY — DX: Family history of other specified conditions: Z84.89

## 2018-11-04 SURGERY — COLONOSCOPY WITH PROPOFOL
Anesthesia: Monitor Anesthesia Care

## 2018-11-04 MED ORDER — LACTATED RINGERS IV SOLN
INTRAVENOUS | Status: DC
  Administered 2018-11-04: 1000 mL via INTRAVENOUS

## 2018-11-04 MED ORDER — PROPOFOL 500 MG/50ML IV EMUL
INTRAVENOUS | Status: DC | PRN
  Administered 2018-11-04: 140 ug/kg/min via INTRAVENOUS

## 2018-11-04 MED ORDER — PROPOFOL 10 MG/ML IV BOLUS
INTRAVENOUS | Status: DC | PRN
  Administered 2018-11-04: 40 mg via INTRAVENOUS

## 2018-11-04 MED ORDER — SODIUM CHLORIDE 0.9 % IV SOLN: INTRAVENOUS | Status: DC

## 2018-11-04 MED ORDER — PROPOFOL 10 MG/ML IV BOLUS
INTRAVENOUS | Status: AC
  Filled 2018-11-04: qty 60

## 2018-11-04 MED ORDER — LIDOCAINE HCL (CARDIAC) PF 100 MG/5ML IV SOSY
PREFILLED_SYRINGE | INTRAVENOUS | Status: DC | PRN
  Administered 2018-11-04: 100 mg via INTRAVENOUS

## 2018-11-04 SURGICAL SUPPLY — 22 items

## 2018-11-04 NOTE — Transfer of Care (Signed)
Immediate Anesthesia Transfer of Care Note  Patient: Melissa Kim  Procedure(s) Performed: COLONOSCOPY WITH PROPOFOL (N/A )  Patient Location: PACU and Endoscopy Unit  Anesthesia Type:MAC  Level of Consciousness: awake, alert  and oriented  Airway & Oxygen Therapy: Patient Spontanous Breathing and Patient connected to face mask oxygen  Post-op Assessment: Report given to RN and Post -op Vital signs reviewed and stable  Post vital signs: Reviewed and stable  Last Vitals:  Vitals Value Taken Time  BP 155/72 11/04/18 0928  Temp    Pulse 87 11/04/18 0929  Resp 29 11/04/18 0929  SpO2 95 % 11/04/18 0929  Vitals shown include unvalidated device data.  Last Pain:  Vitals:   11/04/18 0854  TempSrc: Oral  PainSc: 0-No pain         Complications: No apparent anesthesia complications

## 2018-11-04 NOTE — Discharge Instructions (Signed)

## 2018-11-04 NOTE — Op Note (Signed)
[facility] Patient Name: Melissa Kim Procedure Date: 11/04/2018 MRN: [id number] Attending MD: Milus Banister , MD Date of Birth: 1955-08-15 CSN: [id number] Age: 63 Admit Type: Inpatient Procedure:                Colonoscopy Indications:              Screening for colorectal malignant neoplasm;                            history of pre-cancerous (adenomatous) colon polyps                            (pathologically proven adenomatous polyps removed                            in 2000 and 2002 by SML, 2005 colonoscopy showed no                            polyps). Colonoscopy March 2010 found                            diverticulosis throughout the colon, no polyps or                            cancers Providers:                Milus Banister, MD, Ashley Shelsea Hangartner, RN, Cherylynn Ridges, Technician, Stephanie [place] CRNA Referring MD:              Medicines:                Monitored Anesthesia Care Complications:            No immediate complications. Estimated blood loss:                            None Estimated Blood Loss:     Estimated blood loss: none. Procedure:                Pre-Anesthesia Assessment:                           - Prior to the procedure, a History and Physical                            was performed, and patient medications and                            allergies were reviewed. The patient's tolerance of                            previous anesthesia was also reviewed. The risks                            and benefits of the procedure and the sedation  options and risks were discussed with the patient.                            All questions were answered, and informed consent                            was obtained. Prior Anticoagulants: The patient has                            taken no previous anticoagulant or antiplatelet                            agents. ASA Grade Assessment: IV - A  patient with                            severe systemic disease that is a constant threat                            to life. After reviewing the risks and benefits,                            the patient was deemed in satisfactory condition to                            undergo the procedure.                           After obtaining informed consent, the colonoscope                            was passed under direct vision. Throughout the                            procedure, the patient's blood pressure, pulse, and                            oxygen saturations were monitored continuously. The                            [id number]) Olympus colonoscope was                            introduced through the anus and advanced to the the                            cecum, identified by appendiceal orifice and                            ileocecal valve. The colonoscopy was performed                            without difficulty. The patient tolerated the  procedure well. The quality of the bowel                            preparation was good. The ileocecal valve,                            appendiceal orifice, and rectum were photographed. Scope In: 9:12:51 AM Scope Out: 9:22:47 AM Total Procedure Duration: 0 hours 9 minutes 56 seconds  Findings:      Multiple small and large-mouthed diverticula were found in the entire       colon.      The exam was otherwise without abnormality on direct and retroflexion       views. Impression:               - Diverticulosis in the entire examined colon.                           - The examination was otherwise normal on direct                            and retroflexion views.                           - No polyps or cancers. Moderate Sedation:      Not Applicable - Patient had care per Anesthesia. Recommendation:           - Patient has a contact number available for                            emergencies. The signs  and symptoms of potential                            delayed complications were discussed with the                            patient. Return to normal activities tomorrow.                            Written discharge instructions were provided to the                            patient.                           - Resume previous diet.                           - Continue present medications.                           - Repeat colonoscopy in 10 years for screening. Procedure Code(s):        --- Professional ---                           [phone], Colonoscopy, flexible; diagnostic, including  collection of specimen(s) by brushing or washing,                            when performed (separate procedure) Diagnosis Code(s):        --- Professional ---                           Z12.11, Encounter for screening for malignant                            neoplasm of colon                           K57.30, Diverticulosis of large intestine without                            perforation or abscess without bleeding CPT copyright 2019 American Medical Association. All rights reserved. The codes documented in this report are preliminary and upon coder review may  be revised to meet current compliance requirements. Milus Banister, MD 11/04/2018 9:26:19 AM This report has been signed electronically. Number of Addenda: 0

## 2018-11-04 NOTE — Anesthesia Postprocedure Evaluation (Signed)
Anesthesia Post Note  Patient: Melissa Kim  Procedure(s) Performed: COLONOSCOPY WITH PROPOFOL (N/A )     Patient location during evaluation: Endoscopy Anesthesia Type: MAC Level of consciousness: awake and alert Pain management: pain level controlled Vital Signs Assessment: post-procedure vital signs reviewed and stable Respiratory status: spontaneous breathing, nonlabored ventilation, respiratory function stable and patient connected to nasal cannula oxygen Cardiovascular status: blood pressure returned to baseline and stable Postop Assessment: no apparent nausea or vomiting Anesthetic complications: no    Last Vitals:  Vitals:   11/04/18 0928 11/04/18 0930  BP: (!) 155/72 (!) 157/74  Pulse: 84 83  Resp: (!) 22 (!) 23  Temp: 36.4 C   SpO2: 95% 97%    Last Pain:  Vitals:   11/04/18 0930  TempSrc:   PainSc: 0-No pain                 Barnet Glasgow

## 2018-11-04 NOTE — Interval H&P Note (Signed)
History and Physical Interval Note:  [id number] AM  Melissa Kim  has presented today for surgery, with the diagnosis of colon polyps rectal bleeding.  The various methods of treatment have been discussed with the patient and family. After consideration of risks, benefits and other options for treatment, the patient has consented to  Procedure(s): COLONOSCOPY WITH PROPOFOL (N/A) as a surgical intervention.  The patient's history has been reviewed, patient examined, no change in status, stable for surgery.  I have reviewed the patient's chart and labs.  Questions were answered to the patient's satisfaction.     Milus Banister

## 2018-11-05 ENCOUNTER — Encounter (HOSPITAL_COMMUNITY): Payer: Self-pay | Admitting: Gastroenterology

## 2018-11-15 NOTE — Patient Instructions (Addendum)
Health Maintenance Due  Topic Date Due  . MAMMOGRAM -need to schedule with [facility]- pleaes call them.  04/23/2017  . DEXA SCAN - Team- please check with [facility] [place] to make sure patient will get a call from breast center- also have [facility] inform them that [facility] could not do due to weight limits to see if she needs to be seen at a different facility  08/23/2018  . INFLUENZA VACCINE -today- thank sfor doing this!  09/18/2018   Please stop by lab before you go If you do not have mychart- we will call you about results within 5 business days of [place] receiving them.  If you have mychart- we will send your results within 3 business days of [place] receiving them.  If abnormal or we want to clarify a result, we will call or mychart you to make sure you receive the message.  If you have questions or concerns or don't hear within 5-7 days, please send [place] a message or call us.   Restart enalapril and follow up 1-2 months- schedule before oyou leave  Ms. Quist , Thank you for taking time to come for your Medicare Wellness Visit. I appreciate your ongoing commitment to your health goals. Please review the following plan we discussed and let me know if I can assist you in the future.   These are the goals we discussed:  1. Consider doing chair exercises/can find videos online- could also refer to PT to help you find exercises that are practical for you to do 2. Great job with weight loss of 5 lbs- continue these efforts  3.Recommended scheduling physical in 4 months to 6 months 4. Follow up with your eye doctor 5. Consider talking to an attorney about advanced directives. Bevelyn Ngo- please give [place] handout about directives  This is a list of the screening recommended for you and due dates:  Health Maintenance  Topic Date Due  . Mammogram  04/23/2017  . DEXA scan (bone density measurement)  08/23/2018  . Pneumonia vaccines (2 of 2 - PPSV23) 10/04/2019  . Tetanus Vaccine  08/10/2022  . Colon Cancer  Screening  11/03/2028  . Flu Shot  Completed  .  Hepatitis C: One time screening is recommended by Center for Disease Control  (CDC) for  adults born from [birth year] through 1967.   Completed  . HIV Screening  Completed

## 2018-11-15 NOTE — Progress Notes (Signed)
Phone: [phone]   Subjective:  Patient presents today for their Welcome to Medicare Exam    Preventive Screening-Counseling & Management  Vision screen: mild decrease in left eye- sees optho at least every other year- encouraged follow up   Hearing Screening   125Hz  250Hz  500Hz  1000Hz  2000Hz  3000Hz  4000Hz  6000Hz  8000Hz   Right ear:           Left ear:             Visual Acuity Screening   Right eye Left eye Both eyes  Without correction:     With correction: 20/30 20/40 20/25    Advanced directives: does not have these in place- encouraged to talk to an attorney to help set these up. We discussed today and she is full code  Smoking Status: Never Smoker Second Hand Smoking status: No smokers in home Alcohol Intake: 0 per week, pt is not a drinker  Risk Factors Regular exercise: pt is not able to exercise due to pain- encouraged chair exercises Diet: she states she has cut back on eating-weight down 5 pounds from July 2 visit-congratulated patient!has tried to cut down on portion size  Wt Readings from Last 3 Encounters:  11/19/18 (!) 324 lb 12.8 oz (147.3 kg)  11/04/18 (!) 323 lb (146.5 kg)  10/13/18 (!) 327 lb 12.8 oz (148.7 kg)  Fall Risk: pt states she has not had any falls  Fall Risk  11/19/2018 08/27/2017  Falls in the past year? - No  Number falls in past yr: 0 -  Injury with Fall? 0 -   Opioid use history:  no long term opioids use  BMI monitoring- elevated BMI noted: Body mass index is 67.88 kg/m.  Morbid obesity status noted encouraged need for healthy eating, regular exercise, weight loss.  Did congratulate her on recent weight loss as above  Cardiac risk factors:  Morbid obesity  advanced age (older than 42 for men, 9 for women)  Hyperlipidemia - mild hyperlipidemia noted Lab Results  Component Value Date   CHOL 192 08/19/2018   HDL 70.40 08/19/2018   LDLCALC 101 (H) 08/19/2018   LDLDIRECT 118.0 04/03/2015   TRIG 104.0 08/19/2018   CHOLHDL 3  08/19/2018   No diabetes. -Monitoring for prediabetes/insulin resistance  Lab Results  Component Value Date   HGBA1C 5.8 08/19/2018   HGBA1C 5.8 08/27/2017   HGBA1C 5.6 04/03/2015  Family History: Father with heart disease in late 60s-he was a smoker- cigars- was not particularly overweight. Grandfathers also had heart attacks  Depression Screen None. PHQ2 0 done Depression screen [facility] 2/9 11/19/2018 08/19/2018 08/27/2017  Decreased Interest 0 0 0  Down, Depressed, Hopeless 0 0 0  PHQ - 2 Score 0 0 0    Activities of Daily Living Independent ADLs and IADLs   Hearing Difficulties: -patient declines  Cognitive Testing             No reported trouble.   Mini cog: normal clock draw. 1/3 delayed recall. Normal test result overall  leader, season, table   List the Names of Other Physician/Practitioners you currently use: Patient Care Team: Marin Olp, MD as PCP - General (Family Medicine) - [facility] -Dr. Ardis Hughs [facility] - [facility] for mammograms  Immunization History  Administered Date(s) Administered  . Fluad Quad(high Dose 65+) 11/19/2018  . Influenza Split 12/30/2010, 11/25/2011  . Influenza Whole 11/17/2008, 11/20/2009  . Influenza,inj,Quad PF,6+ Mos 11/02/2012, 04/03/2015, 03/13/2016, 10/29/2017  . Pneumococcal Conjugate-13 10/04/2018  . Td 02/17/2001  . Tdap 08/09/2012  Required Immunizations needed today - received flu shot today.  We discussed Shingrix at pharmacy . Due for pneumovax next year  Screening tests-see below  1. Colon cancer screening- September 2020 with 10-year follow-up per health maintenance reporting-patient has had polyps in the past but none on last exam-would defer to Dr. Ardis Hughs if sooner colonoscopy recommended 2. Lung Cancer screening- not a candidate 3. Skin cancer screening- no dermatologist. Advised use sunscreen if out. Denies new spots/lesions that shes concerned about 4. Cervical cancer screening-  had total hysterectomy  5. Breast cancer  screening- patient is due for mammogram-referral placed 6.  Bone density-recommended bone density at this time. She was not able to complete at [place] due to weight 7. Encouraged dentist follow up  ROS- No pertinent positives discovered in course of AWV Ros pertinent No chest pain or new shortness of breath. No headache or blurry vision.   The following were reviewed and entered/updated in epic: Past Medical History:  Diagnosis Date  . Asthma   . Cancer ([place])   . Complication of anesthesia    when Ether being used- almost did not wake up from Anesthesia  . DIVERTICULITIS, HX OF 05/30/2008  . Diverticulosis   . DIVERTICULOSIS, COLON 05/30/2008  . Family history of adverse reaction to anesthesia    sister -hard to keep asleep with anesthesia  . Fx foot bone NEC-closed   . GERD (gastroesophageal reflux disease)   . Hypertension   . Obesity   . Psoriasis   . STRESS FRACTURE, FOOT 12/05/2008   reports multiple  . Venous stasis dermatitis    Patient Active Problem List   Diagnosis Date Noted  . Morbid obesity ([place]) 11/30/2007    Priority: High  . Hyperlipidemia, unspecified 11/19/2018    Priority: Medium  . Hypothyroidism, unspecified 11/19/2018    Priority: Medium  . History of uterine cancer 03/28/2014    Priority: Medium  . Hyperglycemia 03/28/2014    Priority: Medium  . Overactive bladder 09/24/2009    Priority: Medium  . Osteopenia 12/05/2008    Priority: Medium  . Essential hypertension 06/01/2007    Priority: Medium  . Asthma 09/29/2006    Priority: Medium  . Psoriasis 09/29/2006    Priority: Medium  . History of total hysterectomy 08/27/2017    Priority: Low  . Osteoarthritis, knee 03/28/2014    Priority: Low  . Vitamin D deficiency 11/20/2009    Priority: Low  . Venous (peripheral) insufficiency 09/29/2006    Priority: Low  . GERD 09/29/2006    Priority: Low  . History of colonic polyps   . Rectal bleeding    Past Surgical History:  Procedure  Laterality Date  . ABDOMINAL HYSTERECTOMY     complete reported including cervix. For uterine cancer. released by GYN.   Marland Kitchen APPENDECTOMY    . arthroscopic of knee    . CHOLECYSTECTOMY    . COLONOSCOPY WITH PROPOFOL N/A 11/04/2018   Procedure: COLONOSCOPY WITH PROPOFOL;  Surgeon: Milus Banister, MD;  Location: [facility];  Service: Endoscopy;  Laterality: N/A;  . TONSILLECTOMY      Family History  Problem Relation Age of Onset  . Heart disease Father        MI in late 63s, early 11s, smoker  . Hypertension Mother   . Pancreatic cancer Mother     Medications- reviewed and updated Current Outpatient Medications  Medication Sig Dispense Refill  . acetaminophen (TYLENOL) 500 MG tablet Take 2,000 mg by mouth daily as needed for  moderate pain or headache.     . albuterol (VENTOLIN HFA) 108 (90 Base) MCG/ACT inhaler Inhale 2 puffs into the lungs every 6 (six) hours as needed. For shortness of breath (Patient taking differently: Inhale 2 puffs into the lungs every 6 (six) hours as needed for wheezing or shortness of breath. ) 18 g 5  . diphenhydrAMINE (BENADRYL) 25 mg capsule Take 25 mg by mouth daily as needed for allergies.    Marland Kitchen levothyroxine (SYNTHROID) 50 MCG tablet Take 1 tablet (50 mcg total) by mouth daily. (Patient taking differently: Take 50 mcg by mouth daily. Takes at 930 am daily) 90 tablet 3  . Menthol, Topical Analgesic, (ICY HOT EX) Apply 1 application topically daily as needed (knee pain).    . Tetrahydrozoline HCl (REDNESS RELIEVER EYE DROPS OP) Place 1 drop into both eyes daily as needed (redness).    . Vitamin D, Ergocalciferol, (DRISDOL) 1.25 MG (50000 UT) CAPS capsule Take 1 capsule (50,000 Units total) by mouth every 7 (seven) days. (Patient taking differently: Take 50,000 Units by mouth every Monday. ) 12 capsule 1   No current facility-administered medications for this visit.     Allergies-reviewed and updated Allergies  Allergen Reactions  . Aspirin     Gi upset   . Ibuprofen     GI upset  . Losartan Potassium-Hctz     itching  . Peg 3350-Electrolytes Nausea And Vomiting    vomitin  . Strawberry Extract Hives and Itching  . Adhesive [Tape] Rash    Social History   Socioeconomic History  . Marital status: Married    Spouse name: Not on file  . Number of children: Not on file  . Years of education: Not on file  . Highest education level: Not on file  Occupational History  . Not on file  Social Needs  . Financial resource strain: Not on file  . Food insecurity    Worry: Not on file    Inability: Not on file  . Transportation needs    Medical: Not on file    Non-medical: Not on file  Tobacco Use  . Smoking status: Never Smoker  . Smokeless tobacco: Never Used  Substance and Sexual Activity  . Alcohol use: No  . Drug use: No  . Sexual activity: Not on file  Lifestyle  . Physical activity    Days per week: Not on file    Minutes per session: Not on file  . Stress: Not on file  Relationships  . Social Herbalist on phone: Not on file    Gets together: Not on file    Attends religious service: Not on file    Active member of club or organization: Not on file    Attends meetings of clubs or organizations: Not on file    Relationship status: Not on file  Other Topics Concern  . Not on file  Social History Narrative   Married (patient of Dr. 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 Oropharynx normal other than poor dentition. TM normal- some cerumen obstructing  on the right Neck: no thyromegaly or cervical lymphadenopathy CV: RRR no murmurs rubs or gallops Lungs: CTAB no crackles, wheeze, rhonchi Abdomen:  soft/nontender/nondistended/normal bowel sounds. No rebound or guarding. Morbidly obese Ext: no edema Skin: warm, dry Neuro: grossly normal, moves all extremities, PERRLA    Assessment and Plan:   Welcome to Medicare exam completed- discussed recommended screenings and documented any personalized health advice and referrals for preventive counseling. See AVS as well which was given to patient.   Status of chronic or acute concerns   Morbid obesity (HCC)-see discussion above about weight loss  Hyperlipidemia, unspecified hyperlipidemia type-update lipid panel and calculate 10-year ASCVD risk  Vitamin D deficiency-update vitamin D levels today- takes 50k units once a week- has enough for 12 more weeks.   Essential hypertension-poor control initial check- she ran out of enalapril- we will refill and have her back in 1-2 months to recheck   Venous (peripheral) insufficiency-encourage compression stockings- had them but did not tolerate   Mild intermittent asthma without complication-uses albuterol as needed  Gastroesophageal reflux disease without esophagitis-currently not having to take anything   Overactive bladder-currently not on medication- doing ok without meds   Psoriasis- on elbows- just tolerates- doesn't bother her too much   Osteopenia, unspecified location- we will update bone density under postmenopausal- she is on calcium and vitamin D.- was not able to get last one ordered as was too heavy for  table- will try at breast center   History of uterine cancer-status post total hysterectomy  Hyperglycemia-update A1c with labs  Hypothyroidism-on levothyroxine 50 mcg-update TSH with labs  Recommended follow up: Recommended scheduling physical in 4 months to 6 months   Lab/Order associations:   ICD-10-CM   1. Preventative health care  Z00.00   2. Morbid obesity ([place])  E66.01   3. Need for immunization against influenza  Z23 Flu Vaccine QUAD High Dose(Fluad)  4.  Hyperlipidemia, unspecified hyperlipidemia type  E78.5   5. Postmenopausal  Z78.0 DG Bone Density  6. Vitamin D deficiency  E55.9   7. Essential hypertension  I10   8. Venous (peripheral) insufficiency  I87.2   9. Mild intermittent asthma without complication  A999333   10. Gastroesophageal reflux disease without esophagitis  K21.9   11. Overactive bladder  N32.81   12. Psoriasis  L40.9   13. Osteopenia, unspecified location  M85.80   14. History of uterine cancer  Z85.42   15. Hyperglycemia  R73.9   16. History of total hysterectomy  Z90.710   17. History of colonic polyps  Z86.010   18. Hypothyroidism, unspecified type  E03.9     No orders of the defined types were placed in this encounter.   Return precautions advised. Gar[place] Reddish, MD

## 2018-11-19 ENCOUNTER — Other Ambulatory Visit: Payer: Self-pay

## 2018-11-19 ENCOUNTER — Encounter: Payer: Self-pay | Admitting: Family Medicine

## 2018-11-19 ENCOUNTER — Ambulatory Visit (INDEPENDENT_AMBULATORY_CARE_PROVIDER_SITE_OTHER): Payer: PPO | Admitting: Family Medicine

## 2018-11-19 VITALS — BP 150/82 | HR 78 | Temp 98.3°F | Ht <= 58 in | Wt 324.8 lb

## 2018-11-19 DIAGNOSIS — I10 Essential (primary) hypertension: Secondary | ICD-10-CM

## 2018-11-19 DIAGNOSIS — N3281 Overactive bladder: Secondary | ICD-10-CM | POA: Diagnosis not present

## 2018-11-19 DIAGNOSIS — E559 Vitamin D deficiency, unspecified: Secondary | ICD-10-CM

## 2018-11-19 DIAGNOSIS — Z780 Asymptomatic menopausal state: Secondary | ICD-10-CM | POA: Diagnosis not present

## 2018-11-19 DIAGNOSIS — J4520 Mild intermittent asthma, uncomplicated: Secondary | ICD-10-CM

## 2018-11-19 DIAGNOSIS — R739 Hyperglycemia, unspecified: Secondary | ICD-10-CM | POA: Diagnosis not present

## 2018-11-19 DIAGNOSIS — I872 Venous insufficiency (chronic) (peripheral): Secondary | ICD-10-CM | POA: Diagnosis not present

## 2018-11-19 DIAGNOSIS — L409 Psoriasis, unspecified: Secondary | ICD-10-CM

## 2018-11-19 DIAGNOSIS — Z8542 Personal history of malignant neoplasm of other parts of uterus: Secondary | ICD-10-CM

## 2018-11-19 DIAGNOSIS — Z860100 Personal history of colon polyps, unspecified: Secondary | ICD-10-CM

## 2018-11-19 DIAGNOSIS — E039 Hypothyroidism, unspecified: Secondary | ICD-10-CM | POA: Diagnosis not present

## 2018-11-19 DIAGNOSIS — M8580 Other specified disorders of bone density and structure, unspecified site: Secondary | ICD-10-CM

## 2018-11-19 DIAGNOSIS — Z90710 Acquired absence of both cervix and uterus: Secondary | ICD-10-CM

## 2018-11-19 DIAGNOSIS — Z23 Encounter for immunization: Secondary | ICD-10-CM

## 2018-11-19 DIAGNOSIS — K219 Gastro-esophageal reflux disease without esophagitis: Secondary | ICD-10-CM | POA: Diagnosis not present

## 2018-11-19 DIAGNOSIS — E785 Hyperlipidemia, unspecified: Secondary | ICD-10-CM | POA: Diagnosis not present

## 2018-11-19 DIAGNOSIS — Z0000 Encounter for general adult medical examination without abnormal findings: Secondary | ICD-10-CM | POA: Diagnosis not present

## 2018-11-19 LAB — CBC
HCT: 39.7 % (ref 36.0–46.0)
Hemoglobin: 13 g/dL (ref 12.0–15.0)
MCHC: 32.6 g/dL (ref 30.0–36.0)
MCV: 92 fl (ref 78.0–100.0)
Platelets: 211 10*3/uL (ref 150.0–400.0)
RBC: 4.32 Mil/uL (ref 3.87–5.11)
RDW: 14.7 % (ref 11.5–15.5)
WBC: 7.1 10*3/uL (ref 4.0–10.5)

## 2018-11-19 LAB — COMPREHENSIVE METABOLIC PANEL
ALT: 20 U/L (ref 0–35)
AST: 14 U/L (ref 0–37)
Albumin: 3.9 g/dL (ref 3.5–5.2)
Alkaline Phosphatase: 96 U/L (ref 39–117)
BUN: 11 mg/dL (ref 6–23)
CO2: 31 mEq/L (ref 19–32)
Calcium: 9.3 mg/dL (ref 8.4–10.5)
Chloride: 101 mEq/L (ref 96–112)
Creatinine, Ser: 0.64 mg/dL (ref 0.40–1.20)
GFR: 93.06 mL/min (ref >60.00)
Glucose, Bld: 92 mg/dL (ref 70–99)
Potassium: 4.4 mEq/L (ref 3.5–5.1)
Sodium: 143 mEq/L (ref 135–145)
Total Bilirubin: 0.6 mg/dL (ref 0.2–1.2)
Total Protein: 6.8 g/dL (ref 6.0–8.3)

## 2018-11-19 LAB — TSH: TSH: 3.43 u[IU]/mL (ref 0.35–4.50)

## 2018-11-19 LAB — LIPID PANEL
Cholesterol: 200 mg/dL (ref 0–200)
HDL: 63.2 mg/dL (ref >39.00)
LDL Cholesterol: 113 mg/dL — ABNORMAL HIGH (ref 0–99)
NonHDL: 136.53
Total CHOL/HDL Ratio: 3
Triglycerides: 119 mg/dL (ref 0.0–149.0)
VLDL: 23.8 mg/dL (ref 0.0–40.0)

## 2018-11-19 LAB — HEMOGLOBIN A1C: Hgb A1c MFr Bld: 5.8 % (ref 4.6–6.5)

## 2018-11-19 LAB — VITAMIN D 25 HYDROXY (VIT D DEFICIENCY, FRACTURES): VITD: 28.32 ng/mL — ABNORMAL LOW (ref 30.00–100.00)

## 2018-11-19 MED ORDER — ENALAPRIL MALEATE 5 MG PO TABS: 5.0000 mg | ORAL_TABLET | Freq: Every day | ORAL | 3 refills | Status: DC

## 2018-11-19 NOTE — Assessment & Plan Note (Signed)
Essential hypertension-poor control initial check- she ran out of enalapril- we will refill and have her back in 1-2 months to recheck

## 2018-12-17 DIAGNOSIS — Z1231 Encounter for screening mammogram for malignant neoplasm of breast: Secondary | ICD-10-CM | POA: Diagnosis not present

## 2018-12-17 LAB — HM MAMMOGRAPHY

## 2018-12-22 ENCOUNTER — Encounter: Payer: Self-pay | Admitting: Family Medicine

## 2019-01-05 ENCOUNTER — Other Ambulatory Visit: Payer: Self-pay

## 2019-01-06 ENCOUNTER — Ambulatory Visit (INDEPENDENT_AMBULATORY_CARE_PROVIDER_SITE_OTHER): Payer: PPO | Admitting: Family Medicine

## 2019-01-06 ENCOUNTER — Encounter: Payer: Self-pay | Admitting: Family Medicine

## 2019-01-06 VITALS — BP 140/78 | HR 92 | Temp 97.7°F | Ht <= 58 in | Wt 316.8 lb

## 2019-01-06 DIAGNOSIS — E785 Hyperlipidemia, unspecified: Secondary | ICD-10-CM | POA: Diagnosis not present

## 2019-01-06 DIAGNOSIS — Z23 Encounter for immunization: Secondary | ICD-10-CM | POA: Diagnosis not present

## 2019-01-06 DIAGNOSIS — I10 Essential (primary) hypertension: Secondary | ICD-10-CM

## 2019-01-06 DIAGNOSIS — R739 Hyperglycemia, unspecified: Secondary | ICD-10-CM | POA: Diagnosis not present

## 2019-01-06 MED ORDER — ENALAPRIL MALEATE 5 MG PO TABS: 5.0000 mg | ORAL_TABLET | Freq: Two times a day (BID) | ORAL | 3 refills | Status: DC

## 2019-01-06 NOTE — Progress Notes (Signed)
Phone [phone] In person visit   Subjective:   Melissa Kim is a 63 y.o. year old very pleasant female patient who presents for/with See problem oriented charting Chief Complaint  Patient presents with  . Follow-up  . Hypertension   ROS-  pt denies HA, blurred vision and chest discomfort.  No worsening edema.  Past Medical History-  Patient Active Problem List   Diagnosis Date Noted  . Morbid obesity ([place]) 11/30/2007    Priority: High  . Hyperlipidemia, unspecified 11/19/2018    Priority: Medium  . Hypothyroidism, unspecified 11/19/2018    Priority: Medium  . History of uterine cancer 03/28/2014    Priority: Medium  . Hyperglycemia 03/28/2014    Priority: Medium  . Overactive bladder 09/24/2009    Priority: Medium  . Osteopenia 12/05/2008    Priority: Medium  . Essential hypertension 06/01/2007    Priority: Medium  . Asthma 09/29/2006    Priority: Medium  . Psoriasis 09/29/2006    Priority: Medium  . History of total hysterectomy 08/27/2017    Priority: Low  . Osteoarthritis, knee 03/28/2014    Priority: Low  . Vitamin D deficiency 11/20/2009    Priority: Low  . Venous (peripheral) insufficiency 09/29/2006    Priority: Low  . GERD 09/29/2006    Priority: Low  . History of colonic polyps   . Rectal bleeding     Medications- reviewed and updated Current Outpatient Medications  Medication Sig Dispense Refill  . acetaminophen (TYLENOL) 500 MG tablet Take 2,000 mg by mouth daily as needed for moderate pain or headache.     . albuterol (VENTOLIN HFA) 108 (90 Base) MCG/ACT inhaler Inhale 2 puffs into the lungs every 6 (six) hours as needed. For shortness of breath (Patient taking differently: Inhale 2 puffs into the lungs every 6 (six) hours as needed for wheezing or shortness of breath. ) 18 g 5  . diphenhydrAMINE (BENADRYL) 25 mg capsule Take 25 mg by mouth daily as needed for allergies.    Marland Kitchen enalapril (VASOTEC) 5 MG tablet Take 1 tablet (5 mg total) by  mouth 2 (two) times daily. 180 tablet 3  . levothyroxine (SYNTHROID) 50 MCG tablet Take 1 tablet (50 mcg total) by mouth daily. (Patient taking differently: Take 50 mcg by mouth daily. Takes at 930 am daily) 90 tablet 3  . Menthol, Topical Analgesic, (ICY HOT EX) Apply 1 application topically daily as needed (knee pain).    . Tetrahydrozoline HCl (REDNESS RELIEVER EYE DROPS OP) Place 1 drop into both eyes daily as needed (redness).    . Vitamin D, Ergocalciferol, (DRISDOL) 1.25 MG (50000 UT) CAPS capsule Take 1 capsule (50,000 Units total) by mouth every 7 (seven) days. (Patient taking differently: Take 50,000 Units by mouth every Monday. ) 12 capsule 1   No current facility-administered medications for this visit.      Objective:  BP 140/78   Pulse 92   Temp 97.7 F (36.5 C)   Ht 4\' 10"  (1.473 m)   Wt (!) 316 lb 12.8 oz (143.7 kg)   SpO2 96%   BMI 66.21 kg/m  Gen: NAD, resting comfortably CV: RRR no murmurs rubs or gallops Lungs: CTAB no crackles, wheeze, rhonchi Abdomen: soft/nontender/morbidly obese Ext: no edema Skin: warm, dry    Assessment and Plan   #hypertension S: compliant with enalparil 5 mg now. Last visit had run out. On my check still [id number] systolic BP Readings from Last 3 Encounters:  01/06/19 140/78  11/19/18 (!) 150/82  11/04/18 (!) 164/66  A/P: Poor control today with systolic on my check up to 150-increase enalapril to 5 mg twice a day.  She states had been on 10 mg in the past and was "too much".  We may have to try that dose twice daily next visit if blood pressure is not controlled or consider diuretic.  She had itching on losartan hydrochlorothiazide in the past though  #hyperlipidemia S: Poorly controlled on no medication Lab Results  Component Value Date   CHOL 200 11/19/2018   HDL 63.20 11/19/2018   LDLCALC 113 (H) 11/19/2018   LDLDIRECT 118.0 04/03/2015   TRIG 119.0 11/19/2018   CHOLHDL 3 11/19/2018   A/P: We discussed 10-year ASCVD risk  estimation was over 7.5%-she is not interested in statin regardless.  She states will consider if risk is above 20%-focus on healthy eating/weight loss to try to get levels down -Encouraged home blood pressure check with wrist cuff-no discussed would prefer upper arm cuff-may be difficult fit due to obesity  # Hyperglycemia/insulin resistance/prediabetes/morbid obesity S: Exercise and diet-congratulated patient on 8 pound weight loss!  She states she has cut down on portion size significantly  Lab Results  Component Value Date   HGBA1C 5.8 11/19/2018   HGBA1C 5.8 08/19/2018   HGBA1C 5.8 08/27/2017   A/P: Congratulated patient on weight loss-hopefully this will reduce diabetes risk, lipid levels, long-term risk for morbid obesity.  She is also unable to get bone density since her weight is over 250-she is going to work on weight loss and we will try to get bone density updated at a later date   #Immunization update-patient states she never had a vaccine at home and she is very confident of this in the last 6 months-Prevnar 13 was erroneously listed.  We removed this from her chart and gave her Pneumovax 23 today  Recommended follow up: 1 month follow-up recommended-encouraged her to bring her home cuff with her and check blood pressure at home Future Appointments  Date Time Provider [place]  01/06/2019 11:20 AM Marin Olp, MD [facility]   Lab/Order associations:   ICD-10-CM   1. Essential hypertension  I10   2. Hyperlipidemia, unspecified hyperlipidemia type  E78.5   3. Hyperglycemia  R73.9   4. Morbid obesity ([place])  E66.01     Meds ordered this encounter  Medications  . enalapril (VASOTEC) 5 MG tablet    Sig: Take 1 tablet (5 mg total) by mouth 2 (two) times daily.    Dispense:  180 tablet    Refill:  3   Return precautions advised.  Garret Reddish, MD

## 2019-01-06 NOTE — Patient Instructions (Addendum)
Health Maintenance Due  Topic Date Due  . DEXA SCAN - we will get this done once you get weight under 250 since breast center and  cannot do at current weight. Other options you thought were too far from you so we did not investigate 08/23/2018    Team on 10/04/2018 it is listed she had a pneumonia shot but she reports that did not happen. Please delete this and give her pneumovax 23 today    Increase enalapril to 5 mg twice a day. See me back in a month.   You declined cholesterol medicine but have also lost 8 lbs! Great job- keep up the good work and we can recheck cholesterol next year and hopefully has improved with continued weight loss.   Weight loss can also help keep diabetes at [place]- you have prediabetes so we want to help prevent this from progressing.

## 2019-01-06 NOTE — Addendum Note (Signed)
Addended by: Francella Solian on: 01/06/2019 11:25 AM   Modules accepted: Orders

## 2019-02-21 ENCOUNTER — Other Ambulatory Visit: Payer: Self-pay | Admitting: Family Medicine

## 2019-02-21 MED ORDER — LEVOTHYROXINE SODIUM 50 MCG PO TABS: 50.0000 ug | ORAL_TABLET | Freq: Every day | ORAL | 3 refills | Status: DC

## 2019-02-21 NOTE — Telephone Encounter (Signed)
Medication Refill - Medication: levothyroxine (SYNTHROID) 50 MCG tablet  Has the patient contacted their pharmacy? Yes - states this has been discontinued through pharmacy and she needs new script for something to replace it (Agent: If no, request that the patient contact the pharmacy for the refill.) (Agent: If yes, when and what did the pharmacy advise?)  Preferred Pharmacy (with phone number or street name):  [place] ([place]), [place] - [address] Phone:  [phone]  Fax:  [phone]     Agent: Please be advised that RX refills may take up to 3 business days. We ask that you follow-up with your pharmacy.

## 2019-02-23 ENCOUNTER — Other Ambulatory Visit: Payer: Self-pay

## 2019-02-24 ENCOUNTER — Ambulatory Visit (INDEPENDENT_AMBULATORY_CARE_PROVIDER_SITE_OTHER): Payer: PPO | Admitting: Family Medicine

## 2019-02-24 ENCOUNTER — Encounter: Payer: Self-pay | Admitting: Family Medicine

## 2019-02-24 VITALS — BP 128/74 | HR 82 | Temp 97.8°F | Ht <= 58 in | Wt 316.6 lb

## 2019-02-24 DIAGNOSIS — I10 Essential (primary) hypertension: Secondary | ICD-10-CM | POA: Diagnosis not present

## 2019-02-24 DIAGNOSIS — E039 Hypothyroidism, unspecified: Secondary | ICD-10-CM | POA: Diagnosis not present

## 2019-02-24 DIAGNOSIS — E785 Hyperlipidemia, unspecified: Secondary | ICD-10-CM

## 2019-02-24 DIAGNOSIS — I739 Peripheral vascular disease, unspecified: Secondary | ICD-10-CM | POA: Diagnosis not present

## 2019-02-24 MED ORDER — LEVOTHYROXINE SODIUM 50 MCG PO TABS: 50.0000 ug | ORAL_TABLET | Freq: Every day | ORAL | 3 refills | Status: DC

## 2019-02-24 NOTE — Patient Instructions (Addendum)
Health Maintenance Due  Topic Date Due  . DEXA SCAN-continue great job with weight loss-once we gets under 250 we can update your bone density 08/23/2018   Please pick up new brand of levothyroxine 50 mcg.   Please also schedule bloodwork in 6 weeks ( I ordered the lab today) to recheck since we are changing the manufacturer/brand  Blood pressure looks better/at goal- as long as 138/88 or less  (consider getting new monitor) Recommended follow up: Return in about 9 months (around 11/24/2019) for physical or sooner if needed.

## 2019-02-24 NOTE — Progress Notes (Signed)
Phone [phone] In person visit   Subjective:   Melissa Kim is a 64 y.o. year old very pleasant female patient who presents for/with See problem oriented charting Chief Complaint  Patient presents with  . Follow-up    ROS- Review of Systems  Constitutional: Negative.   HENT: Negative.   Eyes: Negative.   Respiratory: Positive for cough.        History of asthma   Cardiovascular: Positive for leg swelling.       History of leg swelling   Gastrointestinal: Negative.   Genitourinary: Negative.   Musculoskeletal: Negative.   Skin: Negative.   Neurological: Negative.   Endo/Heme/Allergies: Negative.   Psychiatric/Behavioral: Negative.      This visit occurred during the SARS-CoV-2 public health emergency.  Safety protocols were in place, including screening questions prior to the visit, additional usage of staff PPE, and extensive cleaning of exam room while observing appropriate contact time as indicated for disinfecting solutions.   Past Medical History-  Patient Active Problem List   Diagnosis Date Noted  . Morbid obesity ([place]) 11/30/2007    Priority: High  . Hyperlipidemia, unspecified 11/19/2018    Priority: Medium  . Hypothyroidism, unspecified 11/19/2018    Priority: Medium  . History of uterine cancer 03/28/2014    Priority: Medium  . Hyperglycemia 03/28/2014    Priority: Medium  . Overactive bladder 09/24/2009    Priority: Medium  . Osteopenia 12/05/2008    Priority: Medium  . Essential hypertension 06/01/2007    Priority: Medium  . Asthma 09/29/2006    Priority: Medium  . Psoriasis 09/29/2006    Priority: Medium  . History of total hysterectomy 08/27/2017    Priority: Low  . Osteoarthritis, knee 03/28/2014    Priority: Low  . Vitamin D deficiency 11/20/2009    Priority: Low  . Venous (peripheral) insufficiency 09/29/2006    Priority: Low  . GERD 09/29/2006    Priority: Low  . History of colonic polyps   . Rectal bleeding      Medications- reviewed and updated Current Outpatient Medications  Medication Sig Dispense Refill  . acetaminophen (TYLENOL) 500 MG tablet Take 2,000 mg by mouth daily as needed for moderate pain or headache.     . albuterol (VENTOLIN HFA) 108 (90 Base) MCG/ACT inhaler Inhale 2 puffs into the lungs every 6 (six) hours as needed. For shortness of breath (Patient taking differently: Inhale 2 puffs into the lungs every 6 (six) hours as needed for wheezing or shortness of breath. ) 18 g 5  . diphenhydrAMINE (BENADRYL) 25 mg capsule Take 25 mg by mouth daily as needed for allergies.    Marland Kitchen enalapril (VASOTEC) 5 MG tablet Take 1 tablet (5 mg total) by mouth 2 (two) times daily. 180 tablet 3  . Menthol, Topical Analgesic, (ICY HOT EX) Apply 1 application topically daily as needed (knee pain).    . Tetrahydrozoline HCl (REDNESS RELIEVER EYE DROPS OP) Place 1 drop into both eyes daily as needed (redness).    Marland Kitchen levothyroxine (SYNTHROID) 50 MCG tablet Take 1 tablet (50 mcg total) by mouth daily. May substitute new brand. We will recheck TSh in 6 weeks 90 tablet 3  . Vitamin D, Ergocalciferol, (DRISDOL) 1.25 MG (50000 UT) CAPS capsule Take 1 capsule (50,000 Units total) by mouth every 7 (seven) days. (Patient not taking: Reported on 02/24/2019) 12 capsule 1   No current facility-administered medications for this visit.     Objective:  BP 128/74 (BP Location: Left Arm)  Pulse 82   Temp 97.8 F (36.6 C)   Ht 4\' 10"  (1.473 m)   Wt (!) 316 lb 9.6 oz (143.6 kg)   SpO2 95%   BMI 66.17 kg/m  Gen: NAD, resting comfortably CV: RRR no murmurs rubs or gallops Lungs: CTAB no crackles, wheeze, rhonchi Abdomen: Morbid obesity noted Ext: no edema, weak pulses Skin: warm, dry    Assessment and Plan  # Hypertension  S: compliant with enalparil 5 mg bid now increased from daily last visit. Patient has not been checking at home. Does not have monitor. Patient denies any SOB, chest pain or changes in vision.   A/P: Well-controlled today with increased prescription to enalapril 5 mg twice a day -continue current medication  # Hyperlipidemia  S:Not currently on any medications.   Lab Results  Component Value Date   CHOL 200 11/19/2018   HDL 63.20 11/19/2018   LDLCALC 113 (H) 11/19/2018   LDLDIRECT 118.0 04/03/2015   TRIG 119.0 11/19/2018   CHOLHDL 3 11/19/2018  A/P:  With improved blood pressure control her 10-year risk of heart attack or stroke was reduced to 7%.  Patient was not interested in statin therapy regardless so this helps monitor moles not better-continue to work on healthy eating/regular exercise -See claudication discussion below-if has PAD may will to consider statin  #hypothyroidism S: compliant On thyroid medication-levothyroxine 50 mcg. Pharmacy does not have brand anymore.  Patient wanted to make sure it was okay for her to take alternate medicine  She ran out of medicine on monday so she is missed medicine for 4 days. Lab Results  Component Value Date   TSH 3.43 11/19/2018   A/P: I am fine with patient changing manufacturers of levothyroxine.  I ordered a future TSH and she will come back in 6 weeks after change to make sure TSH remains controlled given the narrow therapeutic index-I do not think 4 days of missed medication will be enough to throw off 6-week follow-up.   #Purplish toes/claudication S: Patient states she has noted her toes having a purplish sensation at times.  She also notes some pain in her calves when walking a more significant distance A/P: Patient with weak pulses today.  We will get ABIs for further evaluation-if she does have evidence of peripheral arterial disease [place] need for statin  Morbid obesity ([place]) Weight loss has stabilized-significant decrease prior.  Was more challenging through the holidays.  Encourage patient to continue efforts given profound obesity which even limits our ability to get bone density scan.  See health maintenance.  Encouraged need for healthy eating, regular exercise, weight loss.    Recommended follow up: Return in about 9 months (around 11/24/2019) for physical or sooner if needed.  May need sooner follow-up depending on findings from ABIs Future Appointments  Date Time Provider [place]  11/22/2019 11:00 AM Marin Olp, MD [facility]    Lab/Order associations:   ICD-10-CM   1. Hypothyroidism, unspecified type  E03.9 TSH future  2. Intermittent claudication (HCC)  I73.9 VAS [place] LE ART SEG MULTI ([facility])  3. Essential hypertension  I10   4. Hyperlipidemia, unspecified hyperlipidemia type  E78.5   5. Morbid obesity ([place])  E66.01     Meds ordered this encounter  Medications  . levothyroxine (SYNTHROID) 50 MCG tablet    Sig: Take 1 tablet (50 mcg total) by mouth daily. May substitute new brand. We will recheck TSh in 6 weeks    Dispense:  90 tablet  Refill:  3    Return precautions advised.  Garret Reddish, MD

## 2019-02-25 NOTE — Assessment & Plan Note (Signed)
S: compliant with enalparil 5 mg bid now increased from daily last visit. Patient has not been checking at home. Does not have monitor. Patient denies any SOB, chest pain or changes in vision.  A/P: Well-controlled today with increased prescription to enalapril 5 mg twice a day -continue current medication

## 2019-02-25 NOTE — Assessment & Plan Note (Addendum)
S:Not currently on any medications.   Lab Results  Component Value Date   CHOL 200 11/19/2018   HDL 63.20 11/19/2018   LDLCALC 113 (H) 11/19/2018   LDLDIRECT 118.0 04/03/2015   TRIG 119.0 11/19/2018   CHOLHDL 3 11/19/2018  A/P:  With improved blood pressure control her 10-year risk of heart attack or stroke was reduced to 7%.  Patient was not interested in statin therapy regardless so this helps monitor moles not better-continue to work on healthy eating/regular exercise -See claudication discussion below-if has PAD may will to consider statin

## 2019-02-25 NOTE — Assessment & Plan Note (Signed)
Weight loss has stabilized-significant decrease prior.  Was more challenging through the holidays.  Encourage patient to continue efforts given profound obesity which even limits our ability to get bone density scan.  See health maintenance. Encouraged need for healthy eating, regular exercise, weight loss.

## 2019-02-25 NOTE — Assessment & Plan Note (Signed)
S: compliant On thyroid medication-levothyroxine 50 mcg. Pharmacy does not have brand anymore.  Patient wanted to make sure it was okay for her to take alternate medicine  She ran out of medicine on monday so she is missed medicine for 4 days. Lab Results  Component Value Date   TSH 3.43 11/19/2018   A/P: I am fine with patient changing manufacturers of levothyroxine.  I ordered a future TSH and she will come back in 6 weeks after change to make sure TSH remains controlled given the narrow therapeutic index-I do not think 4 days of missed medication will be enough to throw off 6-week follow-up.

## 2019-02-28 ENCOUNTER — Telehealth (HOSPITAL_COMMUNITY): Payer: Self-pay | Admitting: *Deleted

## 2019-02-28 NOTE — Telephone Encounter (Signed)
Left vm asking pt to call back to schedule ABI.

## 2019-03-08 ENCOUNTER — Telehealth (HOSPITAL_COMMUNITY): Payer: Self-pay

## 2019-03-08 NOTE — Telephone Encounter (Signed)

## 2019-03-09 ENCOUNTER — Ambulatory Visit (HOSPITAL_COMMUNITY)
Admission: RE | Admit: 2019-03-09 | Discharge: 2019-03-09 | Disposition: A | Discharge status: Home / Self Care | Payer: PPO | Source: Ambulatory Visit | Attending: Family Medicine | Admitting: Family Medicine

## 2019-03-09 ENCOUNTER — Other Ambulatory Visit: Payer: Self-pay

## 2019-03-09 DIAGNOSIS — I739 Peripheral vascular disease, unspecified: Secondary | ICD-10-CM | POA: Diagnosis not present

## 2019-03-10 ENCOUNTER — Telehealth: Payer: Self-pay

## 2019-03-10 NOTE — Telephone Encounter (Signed)
Patient stated that she missed a call from this office and would like for someone to return her call. Patient is  unsure of who called her

## 2019-06-28 ENCOUNTER — Telehealth: Payer: Self-pay

## 2019-06-28 NOTE — Telephone Encounter (Signed)
See below

## 2019-06-28 NOTE — Telephone Encounter (Signed)
Called and lm on pt vm, when pt calls back please provide below information per Dr. Yong Channel.

## 2019-06-28 NOTE — Telephone Encounter (Signed)
Please tell her sorry to hear about her husband.  I hope he is able to get home quickly  How are her blood pressures looking without the evening enalapril.  If home blood pressures look good please resend enalapril 5 mg and as once a day to reflect the change and have her continue to monitor with blood pressure goal less than 140/90.  If she does not have a cuff we should have her in for an in person visit

## 2019-06-28 NOTE — Telephone Encounter (Signed)
Patient states that she stop taking enalapril (VASOTEC) 5 MG tablet in the evening because she was having dizzy spells. Patient states that she is still taking medication in the morning. Patient also wanted Dr. Yong Channel know that her husband Khamiya Lehrer is in the hospital.

## 2019-08-23 ENCOUNTER — Other Ambulatory Visit: Payer: Self-pay

## 2019-08-23 MED ORDER — LEVOTHYROXINE SODIUM 50 MCG PO TABS: 50.0000 ug | ORAL_TABLET | Freq: Every day | ORAL | 0 refills | Status: DC

## 2019-09-30 ENCOUNTER — Other Ambulatory Visit: Payer: Self-pay

## 2019-09-30 ENCOUNTER — Encounter: Payer: Self-pay | Admitting: Family Medicine

## 2019-09-30 ENCOUNTER — Ambulatory Visit (INDEPENDENT_AMBULATORY_CARE_PROVIDER_SITE_OTHER): Payer: PPO | Admitting: Family Medicine

## 2019-09-30 VITALS — BP 140/86 | HR 83 | Temp 98.4°F | Ht <= 58 in | Wt 294.0 lb

## 2019-09-30 DIAGNOSIS — I10 Essential (primary) hypertension: Secondary | ICD-10-CM | POA: Diagnosis not present

## 2019-09-30 DIAGNOSIS — L989 Disorder of the skin and subcutaneous tissue, unspecified: Secondary | ICD-10-CM

## 2019-09-30 DIAGNOSIS — E039 Hypothyroidism, unspecified: Secondary | ICD-10-CM

## 2019-09-30 DIAGNOSIS — R739 Hyperglycemia, unspecified: Secondary | ICD-10-CM | POA: Diagnosis not present

## 2019-09-30 MED ORDER — ENALAPRIL MALEATE 5 MG PO TABS: 5.0000 mg | ORAL_TABLET | Freq: Every day | ORAL | 3 refills | Status: DC

## 2019-09-30 MED ORDER — LEVOTHYROXINE SODIUM 50 MCG PO TABS: 50.0000 ug | ORAL_TABLET | Freq: Every day | ORAL | 0 refills | Status: DC

## 2019-09-30 MED ORDER — TRIAMCINOLONE ACETONIDE 0.5 % EX OINT
1.0000 | TOPICAL_OINTMENT | Freq: Two times a day (BID) | CUTANEOUS | 3 refills | Status: DC
Start: 2019-09-30 — End: 2020-05-15

## 2019-09-30 MED ORDER — ALBUTEROL SULFATE HFA 108 (90 BASE) MCG/ACT IN AERS: 2.0000 | INHALATION_SPRAY | Freq: Four times a day (QID) | RESPIRATORY_TRACT | 5 refills | Status: DC | PRN

## 2019-09-30 NOTE — Addendum Note (Signed)
Addended by: Liliane Channel on: 09/30/2019 10:47 AM   Modules accepted: Orders

## 2019-09-30 NOTE — Progress Notes (Signed)
Phone [phone] In person visit   Subjective:   Melissa Kim is a 64 y.o. year old very pleasant female patient who presents for/with See problem oriented charting Chief Complaint  Patient presents with  . Hypertension  . Hypothyroidism    This visit occurred during the SARS-CoV-2 public health emergency.  Safety protocols were in place, including screening questions prior to the visit, additional usage of staff PPE, and extensive cleaning of exam room while observing appropriate contact time as indicated for disinfecting solutions.   Past Medical History-  Patient Active Problem List   Diagnosis Date Noted  . Morbid obesity ([place]) 11/30/2007    Priority: High  . Hyperlipidemia, unspecified 11/19/2018    Priority: Medium  . Hypothyroidism, unspecified 11/19/2018    Priority: Medium  . History of uterine cancer 03/28/2014    Priority: Medium  . Hyperglycemia 03/28/2014    Priority: Medium  . Overactive bladder 09/24/2009    Priority: Medium  . Osteopenia 12/05/2008    Priority: Medium  . Essential hypertension 06/01/2007    Priority: Medium  . Asthma 09/29/2006    Priority: Medium  . Psoriasis 09/29/2006    Priority: Medium  . History of total hysterectomy 08/27/2017    Priority: Low  . Osteoarthritis, knee 03/28/2014    Priority: Low  . Vitamin D deficiency 11/20/2009    Priority: Low  . Venous (peripheral) insufficiency 09/29/2006    Priority: Low  . GERD 09/29/2006    Priority: Low  . History of colonic polyps   . Rectal bleeding     Medications- reviewed and updated Current Outpatient Medications  Medication Sig Dispense Refill  . acetaminophen (TYLENOL) 500 MG tablet Take 2,000 mg by mouth daily as needed for moderate pain or headache.     . albuterol (VENTOLIN HFA) 108 (90 Base) MCG/ACT inhaler Inhale 2 puffs into the lungs every 6 (six) hours as needed. For shortness of breath (Patient taking differently: Inhale 2 puffs into the lungs every 6  (six) hours as needed for wheezing or shortness of breath. ) 18 g 5  . diphenhydrAMINE (BENADRYL) 25 mg capsule Take 25 mg by mouth daily as needed for allergies.    Marland Kitchen enalapril (VASOTEC) 5 MG tablet Take 1 tablet (5 mg total) by mouth 2 (two) times daily. 180 tablet 3  . levothyroxine (SYNTHROID) 50 MCG tablet Take 1 tablet (50 mcg total) by mouth daily. May substitute new brand. We will recheck TSh in 6 weeks 90 tablet 0  . Menthol, Topical Analgesic, (ICY HOT EX) Apply 1 application topically daily as needed (knee pain).    . Tetrahydrozoline HCl (REDNESS RELIEVER EYE DROPS OP) Place 1 drop into both eyes daily as needed (redness).     No current facility-administered medications for this visit.     Objective:  BP 140/86   Pulse 83   Temp 98.4 F (36.9 C) (Temporal)   Ht 4\' 10"  (1.473 m)   Wt 294 lb (133.4 kg)   SpO2 97%   BMI 61.45 kg/m  Gen: NAD, resting comfortably, substantial weight loss CV: RRR no murmurs rubs or gallops Lungs: CTAB no crackles, wheeze, rhonchi Abdomen: soft/nontender/nondistended/normal bowel sounds. No rebound or guarding.  Ext: Venous stasis skin changes as well as some silvery plaques on lower extremities-has similar so will require ask over elbows-primarily on the left Skin: warm, dry     Assessment and Plan   #hypertension S: medication: enalapril 5 mg daily. She had an episode of feeling extremely  lightheaded and passed out in April and she stopped the PM pill at that time and has not had issues since.  Home readings #s:  has not been checking at home. Reports happened in shower (interesting as she had no injury, did not hit head- sounds like perhaps had bene able to lower herself down). Had dizzy episodes prior.   Had similar episodes in the past on 10mg  with Dr. Arnoldo Morale BP Readings from Last 3 Encounters:  09/30/19 140/86  02/24/19 128/74  01/06/19 140/78  A/P: Blood pressure mildly elevated but patient with orthostatic intolerance to higher  dose of medication-we will continue 5 mg daily as long as blood pressure less than 145/90 or even 150/90 . #hyperlipidemia S: Medication: not currently on medications.  10-year ASCVD risk 9.4% Lab Results  Component Value Date   CHOL 200 11/19/2018   HDL 63.20 11/19/2018   LDLCALC 113 (H) 11/19/2018   LDLDIRECT 118.0 04/03/2015   TRIG 119.0 11/19/2018   CHOLHDL 3 11/19/2018   A/P: hopefully improving-we opted to give weight loss some more time and recheck at next visit  #hypothyroidism S: compliant On thyroid medication-levothyroxine 50 mcg. Patient had 4 days missed dose and had to change to alternative. We will check tsh today.  Lab Results  Component Value Date   TSH 3.43 11/19/2018   A/P:Hopefully controlled-update TSH today   # Hyperglycemia/insulin resistance/prediabetes S:  Medication: none Exercise and diet- weight loss as below. Not much exercise. Hard with knees Lab Results  Component Value Date   HGBA1C 5.8 11/19/2018   HGBA1C 5.8 08/19/2018   HGBA1C 5.8 08/27/2017    A/P: hopefully controlled. Update a1c. Continue weight loss. Encouraged chair exercise.   # Obesity morbid  S: down 22 lbs from January. Stayed with aunt who kept temperature extremely high and felt like that helped. Cut down on portion sizes.  Cut out sweets and changed to diet drinks/water Wt Readings from Last 3 Encounters:  09/30/19 294 lb (133.4 kg)  02/24/19 (!) 316 lb 9.6 oz (143.6 kg)  01/06/19 (!) 316 lb 12.8 oz (143.7 kg)  A/P:  Congratulated on the excellent progress.  -Encouraged need for healthy eating, regular exercise, weight loss.  - down 35-36 lbs overall #scalp lesion- 3 x 3 cm and raised over a cm. Has been there for years but getting bigger .started out pea size. Refer to drematology   # Purplish toes-  ABis came back normal thankfully January 2021. Some of this could be venous insufficiency  #Psoriasis-not recently treated-areas on her elbows and shins that bother her.  We  will trial triamcinolone ointment to see if this helps   Recommended follow up:  Keep October physical  And likely awv Future Appointments  Date Time Provider [place]  11/22/2019 11:00 AM Marin Olp, MD [facility]   Lab/Order associations:   ICD-10-CM   1. Hypothyroidism, unspecified type  E03.9 TSH  2. Essential hypertension  Q00 COMPLETE METABOLIC PANEL WITH GFR  3. Hyperglycemia  R73.9 Hemoglobin A1c  4. Morbid obesity ([place])  E66.01   5. Scalp lesion  L98.9 Ambulatory referral to Dermatology    Meds ordered this encounter  Medications  . albuterol (VENTOLIN HFA) 108 (90 Base) MCG/ACT inhaler    Sig: Inhale 2 puffs into the lungs every 6 (six) hours as needed. For shortness of breath    Dispense:  18 g    Refill:  5  . levothyroxine (SYNTHROID) 50 MCG tablet    Sig: Take  1 tablet (50 mcg total) by mouth daily.    Dispense:  90 tablet    Refill:  0  . triamcinolone ointment (KENALOG) 0.5 %    Sig: Apply 1 application topically 2 (two) times daily. For up to 10 days. Take 1 week break after use.    Dispense:  120 g    Refill:  3  . enalapril (VASOTEC) 5 MG tablet    Sig: Take 1 tablet (5 mg total) by mouth daily.    Dispense:  90 tablet    Refill:  3    Return precautions advised.  Garret Reddish, MD

## 2019-09-30 NOTE — Patient Instructions (Addendum)
Health Maintenance Due  Topic Date Due  . DEXA SCAN  Not able to do at this time due to WT- but working hard on weight loss  Never done  . INFLUENZA VACCINE   - will complete later in flu season (please let us know if you get this at another location so we can update your chart) . We should have vaccination here in 1-2 months - can call back for an appointment.   09/18/2019   We will call you within two weeks about your referral to dermatology. If you do not hear within 3 weeks, give [place] a call.   #Psoriasis-not recently treated-areas on her elbows and shins that bother her.  We will trial triamcinolone ointment to see if this helps  Continue enalapril just once a day as long as bp <145/90   Please stop by lab before you go If you have mychart- we will send your results within 3 business days of [place] receiving them.  If you do not have mychart- we will call you about results within 5 business days of [place] receiving them.  *please note we are currently using [facility] which has a longer processing time than [facility] typically so labs may not come back as quickly as in the past *please also note that you will see labs on mychart as soon as they post. I will later go in and write notes on them- will say "notes from Dr. Yong Channel"   Recommended follow up:  Keep October visit

## 2019-10-01 LAB — COMPLETE METABOLIC PANEL WITH GFR
AG Ratio: 1.5 (calc) (ref 1.0–2.5)
ALT: 20 U/L (ref 6–29)
AST: 14 U/L (ref 10–35)
Albumin: 4.1 g/dL (ref 3.6–5.1)
Alkaline phosphatase (APISO): 92 U/L (ref 37–153)
BUN: 17 mg/dL (ref 7–25)
CO2: 30 mmol/L (ref 20–32)
Calcium: 9.4 mg/dL (ref 8.6–10.4)
Chloride: 102 mmol/L (ref 98–110)
Creat: 0.7 mg/dL (ref 0.50–0.99)
GFR, Est African American: 105 mL/min/{1.73_m2} (ref >=60)
GFR, Est Non African American: 90 mL/min/{1.73_m2} (ref >=60)
Globulin: 2.8 g/dL (calc) (ref 1.9–3.7)
Glucose, Bld: 96 mg/dL (ref 65–99)
Potassium: 4 mmol/L (ref 3.5–5.3)
Sodium: 140 mmol/L (ref 135–146)
Total Bilirubin: 0.6 mg/dL (ref 0.2–1.2)
Total Protein: 6.9 g/dL (ref 6.1–8.1)

## 2019-10-01 LAB — HEMOGLOBIN A1C
Hgb A1c MFr Bld: 5.5 % of total Hgb (ref <5.7)
Mean Plasma Glucose: 111 (calc)
eAG (mmol/L): 6.2 (calc)

## 2019-10-01 LAB — TSH: TSH: 3.29 mIU/L (ref 0.40–4.50)

## 2019-11-21 NOTE — Progress Notes (Signed)
Phone [phone]   Subjective:  Patient presents today for their annual physical. Chief complaint-noted.   See problem oriented charting- ROS- full  review of systems was completed and negative except for: dental problems, joint pain, back pain, intentional weight loss, seasonal allergies with some cough and asthma also causes issues, food allergies  The following were reviewed and entered/updated in epic: Past Medical History:  Diagnosis Date  . Asthma   . Cancer ([place])   . Complication of anesthesia    when Ether being used- almost did not wake up from Anesthesia  . DIVERTICULITIS, HX OF 05/30/2008  . Diverticulosis   . DIVERTICULOSIS, COLON 05/30/2008  . Family history of adverse reaction to anesthesia    sister -hard to keep asleep with anesthesia  . Fx foot bone NEC-closed   . GERD (gastroesophageal reflux disease)   . Hypertension   . Obesity   . Psoriasis   . STRESS FRACTURE, FOOT 12/05/2008   reports multiple  . Venous stasis dermatitis    Patient Active Problem List   Diagnosis Date Noted  . Morbid obesity ([place]) 11/30/2007    Priority: High  . Hyperlipidemia, unspecified 11/19/2018    Priority: Medium  . Hypothyroidism, unspecified 11/19/2018    Priority: Medium  . History of uterine cancer 03/28/2014    Priority: Medium  . Hyperglycemia 03/28/2014    Priority: Medium  . Overactive bladder 09/24/2009    Priority: Medium  . Osteopenia 12/05/2008    Priority: Medium  . Essential hypertension 06/01/2007    Priority: Medium  . Asthma 09/29/2006    Priority: Medium  . Psoriasis 09/29/2006    Priority: Medium  . History of total hysterectomy 08/27/2017    Priority: Low  . Osteoarthritis, knee 03/28/2014    Priority: Low  . Vitamin D deficiency 11/20/2009    Priority: Low  . Venous (peripheral) insufficiency 09/29/2006    Priority: Low  . GERD 09/29/2006    Priority: Low  . B12 deficiency 11/22/2019  . History of colonic polyps   . Rectal bleeding     Past Surgical History:  Procedure Laterality Date  . ABDOMINAL HYSTERECTOMY     complete reported including cervix. For uterine cancer. released by GYN.   Marland Kitchen APPENDECTOMY    . arthroscopic of knee    . CHOLECYSTECTOMY    . COLONOSCOPY WITH PROPOFOL N/A 11/04/2018   Procedure: COLONOSCOPY WITH PROPOFOL;  Surgeon: Milus Banister, MD;  Location: [facility];  Service: Endoscopy;  Laterality: N/A;  . TONSILLECTOMY      Family History  Problem Relation Age of Onset  . Heart disease Father        MI in late 60s, early 1s, smoker  . Hypertension Mother   . Pancreatic cancer Mother   . Stroke Maternal Grandmother   . Heart attack Maternal Grandfather        thinks in 67s  . Stroke Paternal Grandmother   . Heart attack Paternal Grandfather     Medications- reviewed and updated Current Outpatient Medications  Medication Sig Dispense Refill  . acetaminophen (TYLENOL) 500 MG tablet Take 2,000 mg by mouth daily as needed for moderate pain or headache.     . albuterol (VENTOLIN HFA) 108 (90 Base) MCG/ACT inhaler Inhale 2 puffs into the lungs every 6 (six) hours as needed. For shortness of breath 18 g 5  . diphenhydrAMINE (BENADRYL) 25 mg capsule Take 25 mg by mouth daily as needed for allergies.    Marland Kitchen enalapril (VASOTEC) 5  MG tablet Take 1 tablet (5 mg total) by mouth daily. 90 tablet 3  . levothyroxine (SYNTHROID) 50 MCG tablet Take 1 tablet (50 mcg total) by mouth daily. 90 tablet 0  . Menthol, Topical Analgesic, (ICY HOT EX) Apply 1 application topically daily as needed (knee pain).    . Tetrahydrozoline HCl (REDNESS RELIEVER EYE DROPS OP) Place 1 drop into both eyes daily as needed (redness).    . triamcinolone ointment (KENALOG) 0.5 % Apply 1 application topically 2 (two) times daily. For up to 10 days. Take 1 week break after use. 120 g 3   No current facility-administered medications for this visit.    Allergies-reviewed and updated Allergies  Allergen Reactions  . Aspirin      Gi upset  . Ibuprofen     GI upset  . Losartan Potassium-Hctz     itching  . Peg 3350-Electrolytes Nausea And Vomiting    vomitin  . Strawberry Extract Hives and Itching  . Adhesive [Tape] Rash    Social History   Social History Narrative   Married (patient of Dr. 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, CPU games   Objective  Objective:  BP 136/62   Pulse 75   Temp 97.9 F (36.6 C) (Temporal)   Ht 4\' 10"  (1.473 m)   Wt 289 lb 9.6 oz (131.4 kg)   SpO2 97%   BMI 60.53 kg/m  Gen: NAD, resting comfortably HEENT: Mucous membranes are moist. Oropharynx normal but poor dentition Neck: no thyromegaly CV: RRR no murmurs rubs or gallops Lungs: CTAB no crackles, wheeze, rhonchi Abdomen: soft/nontender/nondistended/normal bowel sounds. No rebound or guarding.  Ext: no edema Skin: warm, dry Neuro: grossly normal, moves all extremities, PERRLA   Assessment and Plan   64 y.o. female presenting for annual physical.  Health Maintenance counseling: 1. Anticipatory guidance: Patient counseled regarding regular dental exams q6 months- has bene over a year and encouraged to go back, eye exams - over a year encouraged follow up,  avoiding smoking and second hand smoke, limiting alcohol to 1 beverage per day- does not drink.   2. Risk factor reduction:  Advised patient of need for regular exercise and diet rich and fruits and vegetables to reduce risk of heart attack and stroke. Exercise- limited by pain- recommended chair exercises . Diet-has lost another 5 lbs from last visit- working hard on dietary changes. Working on morbid obesity- discussed reversing this trend Wt Readings from Last 3 Encounters:  11/22/19 289 lb 9.6 oz (131.4 kg)  09/30/19 294 lb (133.4 kg)  02/24/19 (!) 316 lb 9.6 oz (143.6 kg)  3. Immunizations/screenings/ancillary studies- flu shot today high dose. Discussed shingrix at pharmacy Immunization History  Administered  Date(s) Administered  . Fluad Quad(high Dose 65+) 11/19/2018  . Influenza Split 12/30/2010, 11/25/2011  . Influenza Whole 11/17/2008, 11/20/2009  . Influenza,inj,Quad PF,6+ Mos 11/02/2012, 04/03/2015, 03/13/2016, 10/29/2017  . PFIZER SARS-COV-2 Vaccination 03/31/2019, 04/25/2019  . Pneumococcal Polysaccharide-23 01/06/2019  . Td 02/17/2001  . Tdap 08/09/2012   Health Maintenance Due  Topic Date Due  . DEXA SCAN - has to get under 250 for bone density Never done   4. Cervical cancer screening-  Had total hysterectomy and past age based screening regardless.  5. Breast cancer screening- mammogram with [facility] 12/17/2018- recommended her call back for another- we can enter referral if needed 6. Colon cancer screening - sept 2020 with 10 year  follow up- patient with polyps in past but none on last exam - Dr. Ardis Hughs did not recommend sooner exam 7. Skin cancer screening- no dermatologist. advised regular sunscreen use. Denies worrisome, changing, or new skin lesions.  8. Birth control/STD check- monogamous and hysterectomy 9. Osteoporosis screening at 37- needs dexa but has to get weight to 250- working on this -Never smoker  Status of chronic or acute concerns   Morbid obesity ([facility])-see discussion above about weight loss  Hyperlipidemia, unspecified hyperlipidemia type-update lipid panel and calculate 10-year ASCVD risk. She may want to continue to work on weight loss as has had really good success and #s may be loewr this time net year.  Lab Results  Component Value Date   CHOL 200 11/19/2018   HDL 63.20 11/19/2018   LDLCALC 113 (H) 11/19/2018   LDLDIRECT 118.0 04/03/2015   TRIG 119.0 11/19/2018   CHOLHDL 3 11/19/2018   Vitamin D deficiency-update vitamin D levels today- prior 50k units once a week- not taking any OTC vitamin D- recommended always being on at least 1000 units a day of vitamin D but may need a boost depending on lab stoday Last vitamin D Lab Results  Component Value  Date   VD25OH 28.32 (L) 11/19/2018   Essential hypertension-well controlled enalapril 5 mg- continue current meds  Venous (peripheral) insufficiency-encourage compression stockings- had them but did not tolerate - improving with weight loss  Mild intermittent asthma without complication-uses albuterol as needed- perhaps 1-2 a month  Gastroesophageal reflux disease without esophagitis-currently not having to take anything   Overactive bladder-currently not on medication- doing ok without meds   Psoriasis- on elbows- just tolerates- doesn't bother her too much . Gave triamcinolone last visit but she is not using regularly.   Osteopenia, unspecified location- we will update bone density under postmenopausal- she is on calcium and vitamin D.- was not able to get last one ordered as was too heavy for [place] table- will try at breast center   History of uterine cancer-status post total hysterectomy. Gynecology released her from more pap smears  Hyperglycemia-updated A1c with labs- looked good on last check. As high as 5.8 prior to weight loss Lab Results  Component Value Date   HGBA1C 5.5 09/30/2019   Hypothyroidism-on levothyroxine 50 mcg-just had this in august defer today Lab Results  Component Value Date   TSH 3.29 09/30/2019   # scalp lesion 3 x 3 cm raised over a cm- referred to derm last visit but she hasnt heard- asked team to check in with her.   # realized had low b12 years ago- update labs today Lab Results  Component Value Date   VITAMINB12 160 (L) 11/02/2012   Recommended follow up: Return in about 6 months (around 05/22/2020) for follow up- or sooner if needed.  Lab/Order associations: fasting   ICD-10-CM   1. Preventative health care  Z00.00   2. Essential hypertension  I10   3. Mild intermittent asthma without complication  D78.24   4. Gastroesophageal reflux disease without esophagitis  K21.9   5. Hypothyroidism, unspecified type  E03.9   6.  Hyperlipidemia, unspecified hyperlipidemia type  E78.5 Lipid Panel (Refl)  7. Hyperglycemia  R73.9   8. Vitamin D deficiency  E55.9 VITAMIN D 25 Hydroxy (Vit-D Deficiency, Fractures)  9. B12 deficiency  E53.8 Vitamin B12   Return precautions advised.  Garret Reddish, MD

## 2019-11-21 NOTE — Patient Instructions (Addendum)
Health Maintenance Due  Topic Date Due  . DEXA SCAN Patient stated that she cant have the rest done until she loses some weight. Has to get to 250 Never done  . INFLUENZA VACCINE In office flu shot high dose  Do covid booster perhaps 2-4 weeks from today  Maybe 2 weeks later- Please check with your pharmacy to see if they have the shingrix vaccine. If they do- please get this immunization and update [place] by phone call or mychart with dates you receive the vaccine  09/18/2019   Follow up with dentist and eye doctor  Great job on weight loss! Keep up the great work  Sign up for a medicare wellness visit- for now they are able to do those by video or even by phone with Otila Kluver our nurse specialist.

## 2019-11-22 ENCOUNTER — Encounter: Payer: Self-pay | Admitting: Family Medicine

## 2019-11-22 ENCOUNTER — Other Ambulatory Visit: Payer: Self-pay

## 2019-11-22 ENCOUNTER — Ambulatory Visit (INDEPENDENT_AMBULATORY_CARE_PROVIDER_SITE_OTHER): Payer: PPO | Admitting: Family Medicine

## 2019-11-22 VITALS — BP 136/62 | HR 75 | Temp 97.9°F | Ht <= 58 in | Wt 289.6 lb

## 2019-11-22 DIAGNOSIS — Z0000 Encounter for general adult medical examination without abnormal findings: Secondary | ICD-10-CM | POA: Diagnosis not present

## 2019-11-22 DIAGNOSIS — E039 Hypothyroidism, unspecified: Secondary | ICD-10-CM

## 2019-11-22 DIAGNOSIS — I10 Essential (primary) hypertension: Secondary | ICD-10-CM

## 2019-11-22 DIAGNOSIS — E559 Vitamin D deficiency, unspecified: Secondary | ICD-10-CM

## 2019-11-22 DIAGNOSIS — E538 Deficiency of other specified B group vitamins: Secondary | ICD-10-CM | POA: Diagnosis not present

## 2019-11-22 DIAGNOSIS — E785 Hyperlipidemia, unspecified: Secondary | ICD-10-CM

## 2019-11-22 DIAGNOSIS — K219 Gastro-esophageal reflux disease without esophagitis: Secondary | ICD-10-CM | POA: Diagnosis not present

## 2019-11-22 DIAGNOSIS — J4520 Mild intermittent asthma, uncomplicated: Secondary | ICD-10-CM | POA: Diagnosis not present

## 2019-11-22 DIAGNOSIS — R739 Hyperglycemia, unspecified: Secondary | ICD-10-CM

## 2019-11-22 NOTE — Addendum Note (Signed)
Addended by: Milton Ferguson D on: 11/22/2019 12:01 PM   Modules accepted: Orders

## 2019-11-22 NOTE — Addendum Note (Signed)
Addended by: Doran Clay A on: 11/22/2019 12:12 PM   Modules accepted: Orders

## 2019-11-23 ENCOUNTER — Other Ambulatory Visit: Payer: Self-pay | Admitting: Family Medicine

## 2019-11-23 LAB — LIPID PANEL
Cholesterol: 204 mg/dL — ABNORMAL HIGH (ref <200)
HDL: 67 mg/dL (ref >=50)
LDL Cholesterol (Calc): 113 mg/dL (calc) — ABNORMAL HIGH
Non-HDL Cholesterol (Calc): 137 mg/dL (calc) — ABNORMAL HIGH (ref <130)
Total CHOL/HDL Ratio: 3 (calc) (ref <5.0)
Triglycerides: 125 mg/dL (ref <150)

## 2019-11-23 LAB — VITAMIN B12: Vitamin B-12: 210 pg/mL (ref 200–1100)

## 2019-11-23 LAB — VITAMIN D 25 HYDROXY (VIT D DEFICIENCY, FRACTURES): Vit D, 25-Hydroxy: 23 ng/mL — ABNORMAL LOW (ref 30–100)

## 2019-11-23 LAB — TSH: TSH: 3.07 mIU/L (ref 0.40–4.50)

## 2019-11-23 MED ORDER — VITAMIN D (ERGOCALCIFEROL) 1.25 MG (50000 UNIT) PO CAPS: 50000.0000 [IU] | ORAL_CAPSULE | ORAL | 0 refills | Status: DC

## 2019-11-25 ENCOUNTER — Telehealth: Payer: Self-pay

## 2019-11-25 NOTE — Telephone Encounter (Signed)
Patient called in stating that she was prescribed Vitamin D, but pharmacy does not have the prescription.

## 2019-11-28 ENCOUNTER — Other Ambulatory Visit: Payer: Self-pay

## 2019-11-28 MED ORDER — VITAMIN D (ERGOCALCIFEROL) 1.25 MG (50000 UNIT) PO CAPS: 50000.0000 [IU] | ORAL_CAPSULE | ORAL | 0 refills | Status: DC

## 2019-11-28 NOTE — Telephone Encounter (Signed)
Rx has been resent 

## 2020-01-26 ENCOUNTER — Encounter: Payer: Self-pay | Admitting: Family Medicine

## 2020-01-26 ENCOUNTER — Telehealth (INDEPENDENT_AMBULATORY_CARE_PROVIDER_SITE_OTHER): Payer: PPO | Admitting: Family Medicine

## 2020-01-26 ENCOUNTER — Other Ambulatory Visit: Payer: Self-pay

## 2020-01-26 DIAGNOSIS — R112 Nausea with vomiting, unspecified: Secondary | ICD-10-CM | POA: Diagnosis not present

## 2020-01-26 DIAGNOSIS — R42 Dizziness and giddiness: Secondary | ICD-10-CM | POA: Diagnosis not present

## 2020-01-26 NOTE — Progress Notes (Signed)
Virtual Visit via Telephone Note  I connected with Melissa Kim on 01/26/20 at 10:20 AM EST by telephone and verified that I am speaking with the correct person using two identifiers.   I discussed the limitations, risks, security and privacy concerns of performing an evaluation and management service by telephone and the availability of in person appointments. I also discussed with the patient that there may be a patient responsible charge related to this service. The patient expressed understanding and agreed to proceed.  Location patient: [place], [place] Location provider: work or home office Participants present for the call: patient, provider Patient did not have a visit with me in the prior 7 days to address this/these issue(s).   History of Present Illness:  Acute telemedicine visit for Vertigo: -Onset: 5 days ago -Symptoms include: acute onset of brief episodes of room spinning/nausea/occ vomiting triggered by certain movements;doing better but was still having interimittent brief spells of dizziness with nausea if turned to the Left resolved when still; now resolved today -Denies: fevers, diarrhea, abd pain, HA, resp symptoms, weakness, numbness -Has tried:nothing -Pertinent past medical history: see PMH - reports did have this happen in the past as well once -Pertinent medication allergies: see allergies -COVID-19 vaccine status: fully vaccinated   Observations/Objective: Patient sounds cheerful and well on the phone. I do not appreciate any SOB. Speech and thought processing are grossly intact. Patient reported vitals:  Assessment and Plan:  Vertigo  Nausea and vomiting, intractability of vomiting not specified, unspecified vomiting type  -we discussed possible serious and likely etiologies, options for evaluation and workup, limitations of telemedicine visit vs in person visit, treatment, treatment risks and precautions. Pt prefers to treat via telemedicine empirically  rather than in person at this moment.  Query BPPV vs other. Currently she reports symptoms are fully resolved today.  Discussed if recurs should have inperson evaluation to confirm dx or r/o other then could try vestibular rehab. Advised no driving w/ vertigo. Discussed possibility of gastroenteritis but seems less likely.   Scheduled follow up with PCP offered: Declined, agrees to follow up if needed Advised to seek prompt in person care if worsening, new symptoms arise, or if is not improving with treatment. Advised of options for inperson care in case PCP office not available. Did let the patient know that I only do telemedicine shifts for [facility] on Tuesdays and Thursdays and advised a follow up visit with PCP or at an [facility] if has further questions or concerns.   Follow Up Instructions:  I did not refer this patient for an OV with me in the next 24 hours for this/these issue(s).  I discussed the assessment and treatment plan with the patient. The patient was provided an opportunity to ask questions and all were answered. The patient agreed with the plan and demonstrated an understanding of the instructions.   I spent 15 minutes on this encounter.   Lucretia Kern, DO

## 2020-02-02 ENCOUNTER — Ambulatory Visit (INDEPENDENT_AMBULATORY_CARE_PROVIDER_SITE_OTHER): Payer: PPO

## 2020-02-02 DIAGNOSIS — Z0000 Encounter for general adult medical examination without abnormal findings: Secondary | ICD-10-CM | POA: Diagnosis not present

## 2020-02-02 NOTE — Patient Instructions (Signed)
Melissa Kim , Thank you for taking time to come for your Medicare Wellness Visit. I appreciate your ongoing commitment to your health goals. Please review the following plan we discussed and let me know if I can assist you in the future.   Screening recommendations/referrals: Colonoscopy: Done 11/04/18 Mammogram: Done 12/17/18 Bone Density: weight restrictions at this time Recommended yearly ophthalmology/optometry visit for glaucoma screening and checkup Recommended yearly dental visit for hygiene and checkup  Vaccinations: Influenza vaccine: Due and discussed pt stated she received at last visit  Pneumococcal vaccine: Up to date Tdap vaccine: Up to date Shingles vaccine: Shingrix discussed. Please contact your pharmacy for coverage information.    Covid-19:Completed 2/11 & 04/25/19  Advanced directives: Please bring a copy of your health care power of attorney and living will to the office at your convenience.  Conditions/risks identified: Lose weight  Next appointment: Follow up in one year for your annual wellness visit    Preventive Care 65 Years and Older, Female Preventive care refers to lifestyle choices and visits with your health care provider that can promote health and wellness. What does preventive care include?  A yearly physical exam. This is also called an annual well check.  Dental exams once or twice a year.  Routine eye exams. Ask your health care provider how often you should have your eyes checked.  Personal lifestyle choices, including:  Daily care of your teeth and gums.  Regular physical activity.  Eating a healthy diet.  Avoiding tobacco and drug use.  Limiting alcohol use.  Practicing safe sex.  Taking low-dose aspirin every day.  Taking vitamin and mineral supplements as recommended by your health care provider. What happens during an annual well check? The services and screenings done by your health care provider during your annual well  check will depend on your age, overall health, lifestyle risk factors, and family history of disease. Counseling  Your health care provider may ask you questions about your:  Alcohol use.  Tobacco use.  Drug use.  Emotional well-being.  Home and relationship well-being.  Sexual activity.  Eating habits.  History of falls.  Memory and ability to understand (cognition).  Work and work Statistician.  Reproductive health. Screening  You may have the following tests or measurements:  Height, weight, and BMI.  Blood pressure.  Lipid and cholesterol levels. These may be checked every 5 years, or more frequently if you are over 37 years old.  Skin check.  Lung cancer screening. You may have this screening every year starting at age 40 if you have a 30-pack-year history of smoking and currently smoke or have quit within the past 15 years.  Fecal occult blood test (FOBT) of the stool. You may have this test every year starting at age 10.  Flexible sigmoidoscopy or colonoscopy. You may have a sigmoidoscopy every 5 years or a colonoscopy every 10 years starting at age 12.  Hepatitis C blood test.  Hepatitis B blood test.  Sexually transmitted disease (STD) testing.  Diabetes screening. This is done by checking your blood sugar (glucose) after you have not eaten for a while (fasting). You may have this done every 1-3 years.  Bone density scan. This is done to screen for osteoporosis. You may have this done starting at age 64.  Mammogram. This may be done every 1-2 years. Talk to your health care provider about how often you should have regular mammograms. Talk with your health care provider about your test results, treatment options,  and if necessary, the need for more tests. Vaccines  Your health care provider may recommend certain vaccines, such as:  Influenza vaccine. This is recommended every year.  Tetanus, diphtheria, and acellular pertussis (Tdap, Td) vaccine. You  may need a Td booster every 10 years.  Zoster vaccine. You may need this after age 63.  Pneumococcal 13-valent conjugate (PCV13) vaccine. One dose is recommended after age 44.  Pneumococcal polysaccharide (PPSV23) vaccine. One dose is recommended after age 66. Talk to your health care provider about which screenings and vaccines you need and how often you need them. This information is not intended to replace advice given to you by your health care provider. Make sure you discuss any questions you have with your health care provider. Document Released: 03/02/2015 Document Revised: 10/24/2015 Document Reviewed: 12/05/2014 Elsevier Interactive Patient Education  2017 [place] Prevention in the Home Falls can cause injuries. They can happen to people of all ages. There are many things you can do to make your home safe and to help prevent falls. What can I do on the outside of my home?  Regularly fix the edges of walkways and driveways and fix any cracks.  Remove anything that might make you trip as you walk through a door, such as a raised step or threshold.  Trim any bushes or trees on the path to your home.  Use bright outdoor lighting.  Clear any walking paths of anything that might make someone trip, such as rocks or tools.  Regularly check to see if handrails are loose or broken. Make sure that both sides of any steps have handrails.  Any raised decks and porches should have guardrails on the edges.  Have any leaves, snow, or ice cleared regularly.  Use sand or salt on walking paths during winter.  Clean up any spills in your garage right away. This includes oil or grease spills. What can I do in the bathroom?  Use night lights.  Install grab bars by the toilet and in the tub and shower. Do not use towel bars as grab bars.  Use non-skid mats or decals in the tub or shower.  If you need to sit down in the shower, use a plastic, non-slip stool.  Keep the floor  dry. Clean up any water that spills on the floor as soon as it happens.  Remove soap buildup in the tub or shower regularly.  Attach bath mats securely with double-sided non-slip rug tape.  Do not have throw rugs and other things on the floor that can make you trip. What can I do in the bedroom?  Use night lights.  Make sure that you have a light by your bed that is easy to reach.  Do not use any sheets or blankets that are too big for your bed. They should not hang down onto the floor.  Have a firm chair that has side arms. You can use this for support while you get dressed.  Do not have throw rugs and other things on the floor that can make you trip. What can I do in the kitchen?  Clean up any spills right away.  Avoid walking on wet floors.  Keep items that you use a lot in easy-to-reach places.  If you need to reach something above you, use a strong step stool that has a grab bar.  Keep electrical cords out of the way.  Do not use floor polish or wax that makes floors slippery. If  you must use wax, use non-skid floor wax.  Do not have throw rugs and other things on the floor that can make you trip. What can I do with my stairs?  Do not leave any items on the stairs.  Make sure that there are handrails on both sides of the stairs and use them. Fix handrails that are broken or loose. Make sure that handrails are as long as the stairways.  Check any carpeting to make sure that it is firmly attached to the stairs. Fix any carpet that is loose or worn.  Avoid having throw rugs at the top or bottom of the stairs. If you do have throw rugs, attach them to the floor with carpet tape.  Make sure that you have a light switch at the top of the stairs and the bottom of the stairs. If you do not have them, ask someone to add them for you. What else can I do to help prevent falls?  Wear shoes that:  Do not have high heels.  Have rubber bottoms.  Are comfortable and fit you  well.  Are closed at the toe. Do not wear sandals.  If you use a stepladder:  Make sure that it is fully opened. Do not climb a closed stepladder.  Make sure that both sides of the stepladder are locked into place.  Ask someone to hold it for you, if possible.  Clearly mark and make sure that you can see:  Any grab bars or handrails.  First and last steps.  Where the edge of each step is.  Use tools that help you move around (mobility aids) if they are needed. These include:  Canes.  Walkers.  Scooters.  Crutches.  Turn on the lights when you go into a dark area. Replace any light bulbs as soon as they burn out.  Set up your furniture so you have a clear path. Avoid moving your furniture around.  If any of your floors are uneven, fix them.  If there are any pets around you, be aware of where they are.  Review your medicines with your doctor. Some medicines can make you feel dizzy. This can increase your chance of falling. Ask your doctor what other things that you can do to help prevent falls. This information is not intended to replace advice given to you by your health care provider. Make sure you discuss any questions you have with your health care provider. Document Released: 11/30/2008 Document Revised: 07/12/2015 Document Reviewed: 03/10/2014 Elsevier Interactive Patient Education  2017 [employer].

## 2020-02-02 NOTE — Progress Notes (Signed)
Virtual Visit via Telephone Note  I connected with  Melissa Kim on 02/02/20 at  1:45 PM EST by telephone and verified that I am speaking with the correct person using two identifiers.  Medicare Annual Wellness visit completed telephonically due to Covid-19 pandemic.   Persons participating in this call: This Health Coach and this patient.   Location: Patient: Home Provider: Office   I discussed the limitations, risks, security and privacy concerns of performing an evaluation and management service by telephone and the availability of in person appointments. The patient expressed understanding and agreed to proceed.  Unable to perform video visit due to video visit attempted and failed and/or patient does not have video capability.   Some vital signs may be absent or patient reported.   Willette Brace, LPN    Subjective:   Melissa Kim is a 64 y.o. female who presents for an Initial Medicare Annual Wellness Visit.  Review of Systems     Cardiac Risk Factors include: advanced age (>59men, >51 women);hypertension;obesity (BMI >30kg/m2)     Objective:    There were no vitals filed for this visit. There is no height or weight on file to calculate BMI.  Advanced Directives 02/02/2020 11/04/2018 11/01/2018  Does Patient Have a Medical Advance Directive? No No No  Would patient like information on creating a medical advance directive? Yes (MAU/Ambulatory/Procedural Areas - Information given) No - Patient declined No - Patient declined    Current Medications (verified) Outpatient Encounter Medications as of 02/02/2020  Medication Sig  . acetaminophen (TYLENOL) 500 MG tablet Take 2,000 mg by mouth daily as needed for moderate pain or headache.   . albuterol (VENTOLIN HFA) 108 (90 Base) MCG/ACT inhaler Inhale 2 puffs into the lungs every 6 (six) hours as needed. For shortness of breath  . diphenhydrAMINE (BENADRYL) 25 mg capsule Take 25 mg by mouth daily as needed for  allergies.  Marland Kitchen enalapril (VASOTEC) 5 MG tablet Take 1 tablet (5 mg total) by mouth daily.  Marland Kitchen levothyroxine (SYNTHROID) 50 MCG tablet Take 1 tablet (50 mcg total) by mouth daily.  . Menthol, Topical Analgesic, (ICY HOT EX) Apply 1 application topically daily as needed (knee pain).  . Tetrahydrozoline HCl (REDNESS RELIEVER EYE DROPS OP) Place 1 drop into both eyes daily as needed (redness).  . Vitamin D, Ergocalciferol, (DRISDOL) 1.25 MG (50000 UNIT) CAPS capsule Take 1 capsule (50,000 Units total) by mouth every 7 (seven) days.  Marland Kitchen triamcinolone ointment (KENALOG) 0.5 % Apply 1 application topically 2 (two) times daily. For up to 10 days. Take 1 week break after use. (Patient not taking: Reported on 02/02/2020)   No facility-administered encounter medications on file as of 02/02/2020.    Allergies (verified) Aspirin, Ibuprofen, Losartan potassium-hctz, Peg 3350-electrolytes, Strawberry extract, and Adhesive [tape]   History: Past Medical History:  Diagnosis Date  . Asthma   . Cancer ([place])   . Complication of anesthesia    when Ether being used- almost did not wake up from Anesthesia  . DIVERTICULITIS, HX OF 05/30/2008  . Diverticulosis   . DIVERTICULOSIS, COLON 05/30/2008  . Family history of adverse reaction to anesthesia    sister -hard to keep asleep with anesthesia  . Fx foot bone NEC-closed   . GERD (gastroesophageal reflux disease)   . Hypertension   . Obesity   . Psoriasis   . STRESS FRACTURE, FOOT 12/05/2008   reports multiple  . Venous stasis dermatitis    Past Surgical History:  Procedure  Laterality Date  . ABDOMINAL HYSTERECTOMY     complete reported including cervix. For uterine cancer. released by GYN.   Marland Kitchen APPENDECTOMY    . arthroscopic of knee    . CHOLECYSTECTOMY    . COLONOSCOPY WITH PROPOFOL N/A 11/04/2018   Procedure: COLONOSCOPY WITH PROPOFOL;  Surgeon: Milus Banister, MD;  Location: [facility];  Service: Endoscopy;  Laterality: N/A;  . TONSILLECTOMY      Family History  Problem Relation Age of Onset  . Heart disease Father        MI in late 84s, early 97s, smoker  . Hypertension Mother   . Pancreatic cancer Mother   . Stroke Maternal Grandmother   . Heart attack Maternal Grandfather        thinks in 67s  . Stroke Paternal Grandmother   . Heart attack Paternal Grandfather    Social History   Socioeconomic History  . Marital status: Married    Spouse name: Not on file  . Number of children: Not on file  . Years of education: Not on file  . Highest education level: Not on file  Occupational History  . Occupation: retired  Tobacco Use  . Smoking status: Never Smoker  . Smokeless tobacco: Never Used  Vaping Use  . Vaping Use: Never used  Substance and Sexual Activity  . Alcohol use: No  . Drug use: No  . Sexual activity: Not on file  Other Topics Concern  . Not on file  Social History Narrative   Married (patient of Dr. 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 Difficulty of Paying Living Expenses: Not hard at all  Food Insecurity: No Food Insecurity  . Worried About Charity fundraiser in the Last Year: Never true  . Ran Out of Food in the Last Year: Never true  Transportation Needs: No Transportation Needs  . Lack of Transportation (Medical): No  . Lack of Transportation (Non-Medical): No  Physical Activity: Inactive  . Days of Exercise per Week: 0 days  . Minutes of Exercise per Session: 0 min  Stress: No Stress Concern Present  . Feeling of Stress : Not at all  Social Connections: Moderately Isolated  . Frequency of Communication with Friends and Family: More than three times a week  . Frequency of Social Gatherings with Friends and Family: Once a week  . Attends Religious Services: Never  . Active Member of Clubs or Organizations: No  . Attends Archivist Meetings: Never   . Marital Status: Married    Tobacco Counseling Counseling given: Not Answered   Clinical Intake:  Pre-visit preparation completed: Yes  Pain : No/denies pain     BMI - recorded: 60.54 Nutritional Status: BMI > 30  Obese Nutritional Risks: None Diabetes: No     Diabetic?No  Interpreter Needed?: No  Information entered by :: Charlott Rakes, LPN   Activities of Daily Living In your present state of health, do you have any difficulty performing the following activities: 02/02/2020 02/24/2019  Hearing? N N  Vision? N N  Difficulty concentrating or making decisions? N N  Walking or climbing stairs? Y Y  Comment realted to knees Pt uses walker for mobility  Dressing or bathing? N N  Doing errands, shopping? N N  Preparing Food and eating ? N -  Using the Toilet? N -  In the past six months, have you accidently leaked urine? Y -  Comment at times -  Do you have problems with loss of bowel control? N -  Managing your Medications? N -  Managing your Finances? N -  Housekeeping or managing your Housekeeping? N -  Some recent data might be hidden    Patient Care Team: Marin Olp, MD as PCP - General (Family Medicine) Milus Banister, MD as Attending Physician (Gastroenterology)  Indicate any recent Medical Services you may have received from other than [facility] providers in the past year (date may be approximate).     Assessment:   This is a routine wellness examination for Melissa Kim.  Hearing/Vision screen  Hearing Screening   125Hz  250Hz  500Hz  1000Hz  2000Hz  3000Hz  4000Hz  6000Hz  8000Hz   Right ear:           Left ear:           Comments: Pt denies any hearing issues   Vision Screening Comments: Pt follows up with my eye Dr as needed  Dietary issues and exercise activities discussed: Current Exercise Habits: The patient does not participate in regular exercise at present  Goals    . Patient Stated     Lose weight       Depression Screen PHQ 2/9 Scores  02/02/2020 01/06/2019 11/19/2018 08/19/2018 08/27/2017  PHQ - 2 Score 0 0 0 0 0    Fall Risk Fall Risk  02/02/2020 09/30/2019 02/24/2019 11/19/2018 08/27/2017  Falls in the past year? 1 0 0 - No  Number falls in past yr: 1 0 0 0 -  Injury with Fall? 0 0 0 0 -  Risk for fall due to : Impaired balance/gait;Impaired vision;Other (Comment) - - - -  Risk for fall due to: Comment pt has episodes of vertigo at times - - - -  Follow up Falls prevention discussed - - - -    FALL RISK PREVENTION PERTAINING TO THE HOME:   Any stairs in or around the home? Yes  If so, are there any without handrails? No  Home free of loose throw rugs in walkways, pet beds, electrical cords, etc? Yes  Adequate lighting in your home to reduce risk of falls? Yes   ASSISTIVE DEVICES UTILIZED TO PREVENT FALLS:  Life alert? No  Use of a cane, walker or w/c? Yes  Grab bars in the bathroom? Yes  Shower chair or bench in shower? Yes  Elevated toilet seat or a handicapped toilet? No   TIMED UP AND GO:  Was the test performed? No .    Cognitive Function:     6CIT Screen 02/02/2020  What Year? 0 points  What month? 0 points  Count back from 20 0 points  Months in reverse 0 points  Repeat phrase 2 points    Immunizations Immunization History  Administered Date(s) Administered  . Fluad Quad(high Dose 65+) 11/19/2018  . Influenza Split 12/30/2010, 11/25/2011  . Influenza Whole 11/17/2008, 11/20/2009  . Influenza,inj,Quad PF,6+ Mos 11/02/2012, 04/03/2015, 03/13/2016, 10/29/2017  . PFIZER SARS-COV-2 Vaccination 03/31/2019, 04/25/2019  . Pneumococcal Polysaccharide-23 01/06/2019  . Td 02/17/2001  . Tdap 08/09/2012    TDAP status: Up to date  Flu Vaccine status: Due, Education has been provided regarding the importance of this vaccine. Advised may receive this vaccine at local pharmacy or Health Dept. Aware to provide a copy of the vaccination record if obtained from local pharmacy or Health Dept. Verbalized  acceptance  and understanding.  Pt stated that she received at last visit, no documentation found   Pneumococcal vaccine status: Up to date  Covid-19 vaccine status: Completed vaccines will call when receives booster  Qualifies for Shingles Vaccine? Yes   Zostavax completed No   Shingrix Completed?: No.    Education has been provided regarding the importance of this vaccine. Patient has been advised to call insurance company to determine out of pocket expense if they have not yet received this vaccine. Advised may also receive vaccine at local pharmacy or Health Dept. Verbalized acceptance and understanding.  Screening Tests Health Maintenance  Topic Date Due  . COVID-19 Vaccine (3 - Booster for Pfizer series) 10/26/2019  . PNA vac Low Risk Adult (2 of 2 - PCV13) 01/06/2020  . INFLUENZA VACCINE  05/17/2020 (Originally 09/18/2019)  . DEXA SCAN  05/21/2020 (Originally 08/23/2018)  . MAMMOGRAM  12/16/2020  . TETANUS/TDAP  08/10/2022  . COLONOSCOPY  11/03/2028  . Hepatitis C Screening  Completed    Health Maintenance  Health Maintenance Due  Topic Date Due  . COVID-19 Vaccine (3 - Booster for Pfizer series) 10/26/2019  . PNA vac Low Risk Adult (2 of 2 - PCV13) 01/06/2020    Colorectal cancer screening: Type of screening: Colonoscopy. Completed 11/04/18. Repeat every 10 years  Mammogram status: Completed 12/17/18. Repeat every year  Bone density: unable due to weight restrictions at this time   Additional Screening:  Hepatitis C Screening:  Completed 04/03/15  Vision Screening: Recommended annual ophthalmology exams for early detection of glaucoma and other disorders of the eye. Is the patient up to date with their annual eye exam?  No  Who is the provider or what is the name of the office in which the patient attends annual eye exams? Will make appt this upcoming year  Dental Screening: Recommended annual dental exams for proper oral hygiene  Community Resource Referral / Chronic  Care Management: CRR required this visit?  No   CCM required this visit?  No      Plan:     I have personally reviewed and noted the following in the patient's chart:   . Medical and social history . Use of alcohol, tobacco or illicit drugs  . Current medications and supplements . Functional ability and status . Nutritional status . Physical activity . Advanced directives . List of other physicians . Hospitalizations, surgeries, and ER visits in previous 12 months . Vitals . Screenings to include cognitive, depression, and falls . Referrals and appointments  In addition, I have reviewed and discussed with patient certain preventive protocols, quality metrics, and best practice recommendations. A written personalized care plan for preventive services as well as general preventive health recommendations were provided to patient.     Willette Brace, LPN   [id number]   Nurse Notes: None

## 2020-02-27 ENCOUNTER — Other Ambulatory Visit: Payer: Self-pay | Admitting: Family Medicine

## 2020-05-10 DIAGNOSIS — L7211 Pilar cyst: Secondary | ICD-10-CM | POA: Diagnosis not present

## 2020-05-15 ENCOUNTER — Ambulatory Visit (INDEPENDENT_AMBULATORY_CARE_PROVIDER_SITE_OTHER): Payer: PPO | Admitting: Gastroenterology

## 2020-05-15 ENCOUNTER — Encounter: Payer: Self-pay | Admitting: Gastroenterology

## 2020-05-15 ENCOUNTER — Other Ambulatory Visit (INDEPENDENT_AMBULATORY_CARE_PROVIDER_SITE_OTHER): Payer: PPO

## 2020-05-15 VITALS — BP 166/78 | HR 80 | Ht <= 58 in | Wt 285.2 lb

## 2020-05-15 DIAGNOSIS — K625 Hemorrhage of anus and rectum: Secondary | ICD-10-CM

## 2020-05-15 LAB — CBC WITH DIFFERENTIAL/PLATELET
Basophils Absolute: 0.1 10*3/uL (ref 0.0–0.1)
Basophils Relative: 1.2 % (ref 0.0–3.0)
Eosinophils Absolute: 0.2 10*3/uL (ref 0.0–0.7)
Eosinophils Relative: 2.9 % (ref 0.0–5.0)
HCT: 41.7 % (ref 36.0–46.0)
Hemoglobin: 13.7 g/dL (ref 12.0–15.0)
Lymphocytes Relative: 24.9 % (ref 12.0–46.0)
Lymphs Abs: 1.8 10*3/uL (ref 0.7–4.0)
MCHC: 32.8 g/dL (ref 30.0–36.0)
MCV: 90.7 fl (ref 78.0–100.0)
Monocytes Absolute: 0.5 10*3/uL (ref 0.1–1.0)
Monocytes Relative: 7.3 % (ref 3.0–12.0)
Neutro Abs: 4.5 10*3/uL (ref 1.4–7.7)
Neutrophils Relative %: 63.7 % (ref 43.0–77.0)
Platelets: 227 10*3/uL (ref 150.0–400.0)
RBC: 4.6 Mil/uL (ref 3.87–5.11)
RDW: 14.1 % (ref 11.5–15.5)
WBC: 7.1 10*3/uL (ref 4.0–10.5)

## 2020-05-15 MED ORDER — NA SULFATE-K SULFATE-MG SULF 17.5-3.13-1.6 GM/177ML PO SOLN: 1.0000 | Freq: Once | ORAL | 0 refills | Status: AC

## 2020-05-15 NOTE — Progress Notes (Signed)
Review of pertinent gastrointestinal problems: 1. history of pre-cancerous (adenomatous) colon polyps (pathologically proven adenomatous polyps removed in 2000 and 2002 by [facility], 2005 colonoscopy showed no polyps).  Colonoscopy March 2010 found diverticulosis throughout the colon, no polyps or cancers.  Colonoscopy September 2020 showed pan diverticulosis.  Examination was otherwise normal.  HPI: This is a pleasant 65 year old woman  I last saw her for colonoscopy about a year and a half ago.  See those results summarized above.  She has massive obesity with a BMI around 60  She has had intermittent bright red blood rectal bleeding for many many years.  She brought pictures on her phone and showed me 8 or 10 of them that clearly shows bright red blood on tissue paper and in the toilet.  She is sure this is not vaginal bleeding because she has had a hysterectomy.  She is not on any blood thinners.  She has bleeding like this for several days in a row a couple times per month.  She also asked about her husband's next colonoscopy.   ROS: complete GI ROS as described in HPI, all other review negative.  Constitutional:  No unintentional weight loss   Past Medical History:  Diagnosis Date  . Asthma   . Cancer ([place])   . Complication of anesthesia    when Ether being used- almost did not wake up from Anesthesia  . DIVERTICULITIS, HX OF 05/30/2008  . Diverticulosis   . DIVERTICULOSIS, COLON 05/30/2008  . Family history of adverse reaction to anesthesia    sister -hard to keep asleep with anesthesia  . Fx foot bone NEC-closed   . GERD (gastroesophageal reflux disease)   . Hypertension   . Obesity   . Psoriasis   . STRESS FRACTURE, FOOT 12/05/2008   reports multiple  . Venous stasis dermatitis     Past Surgical History:  Procedure Laterality Date  . ABDOMINAL HYSTERECTOMY     complete reported including cervix. For uterine cancer. released by GYN.   Marland Kitchen APPENDECTOMY    . arthroscopic  of knee    . CHOLECYSTECTOMY    . COLONOSCOPY WITH PROPOFOL N/A 11/04/2018   Procedure: COLONOSCOPY WITH PROPOFOL;  Surgeon: Milus Banister, MD;  Location: [facility];  Service: Endoscopy;  Laterality: N/A;  . TONSILLECTOMY      Current Outpatient Medications  Medication Sig Dispense Refill  . acetaminophen (TYLENOL) 500 MG tablet Take 2,000 mg by mouth daily as needed for moderate pain or headache.     . albuterol (VENTOLIN HFA) 108 (90 Base) MCG/ACT inhaler Inhale 2 puffs into the lungs every 6 (six) hours as needed. For shortness of breath 18 g 5  . diphenhydrAMINE (BENADRYL) 25 mg capsule Take 25 mg by mouth daily as needed for allergies.    Marland Kitchen enalapril (VASOTEC) 5 MG tablet Take 1 tablet (5 mg total) by mouth daily. 90 tablet 3  . EUTHYROX 50 MCG tablet Take 1 tablet by mouth once daily 90 tablet 0  . Menthol, Topical Analgesic, (ICY HOT EX) Apply 1 application topically daily as needed (knee pain).    . Tetrahydrozoline HCl (REDNESS RELIEVER EYE DROPS OP) Place 1 drop into both eyes daily as needed (redness).    . Vitamin D, Ergocalciferol, (DRISDOL) 1.25 MG (50000 UNIT) CAPS capsule Take 1 capsule (50,000 Units total) by mouth every 7 (seven) days. 12 capsule 0   No current facility-administered medications for this visit.    Allergies as of 05/15/2020 - Review Complete 05/15/2020  Allergen Reaction Noted  . Aspirin    . Ibuprofen    . Losartan potassium-hctz    . Peg 3350-electrolytes Nausea And Vomiting 04/11/2008  . Strawberry extract Hives and Itching 05/23/2013  . Adhesive [tape] Rash 11/01/2018    Family History  Problem Relation Age of Onset  . Heart disease Father        MI in late 31s, early 47s, smoker  . Hypertension Mother   . Pancreatic cancer Mother   . Stroke Maternal Grandmother   . Heart attack Maternal Grandfather        thinks in 65s  . Stroke Paternal Grandmother   . Tuberculosis Paternal Grandmother   . Heart attack Paternal Grandfather   .  Colon cancer Neg Hx   . Esophageal cancer Neg Hx     Social History   Socioeconomic History  . Marital status: Married    Spouse name: Not on file  . Number of children: Not on file  . Years of education: Not on file  . Highest education level: Not on file  Occupational History  . Occupation: retired  Tobacco Use  . Smoking status: Never Smoker  . Smokeless tobacco: Never Used  Vaping Use  . Vaping Use: Never used  Substance and Sexual Activity  . Alcohol use: No  . Drug use: No  . Sexual activity: Not on file  Other Topics Concern  . Not on file  Social History Narrative   Married (patient of Dr. 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 Difficulty of Paying Living Expenses: Not hard at all  Food Insecurity: No Food Insecurity  . Worried About Charity fundraiser in the Last Year: Never true  . Ran Out of Food in the Last Year: Never true  Transportation Needs: No Transportation Needs  . Lack of Transportation (Medical): No  . Lack of Transportation (Non-Medical): No  Physical Activity: Inactive  . Days of Exercise per Week: 0 days  . Minutes of Exercise per Session: 0 min  Stress: No Stress Concern Present  . Feeling of Stress : Not at all  Social Connections: Moderately Isolated  . Frequency of Communication with Friends and Family: More than three times a week  . Frequency of Social Gatherings with Friends and Family: Once a week  . Attends Religious Services: Never  . Active Member of Clubs or Organizations: No  . Attends Archivist Meetings: Never  . Marital Status: Married  Human resources officer Violence: Not At Risk  . Fear of Current or Ex-Partner: No  . Emotionally Abused: No  . Physically Abused: No  . Sexually Abused: No     Physical Exam: Ht 4\' 10"  (1.473 m)   Wt 285 lb 3.2 oz (129.4 kg)   BMI 59.61 kg/m   Constitutional: generally well-appearing, except for massive obesity, she walks with a walker Psychiatric: alert and oriented x3 Abdomen: soft, nontender, nondistended, no obvious ascites, no peritoneal signs, normal bowel sounds No peripheral edema noted in lower extremities  Assessment and plan: 65 y.o. female with massive obesity, chronic intermittent bright red blood per rectum  The bleeding seems to possibly have been increasing in pace.  Unclear etiology and I recommended flexible sigmoidoscopy at her soonest convenience for better evaluation.  I think it would be extremely difficult to  get an adequate examination here in our office given her massive obesity.  I have never seen hemorrhoids but certainly her bleeding seems hemorrhoidal clinically.  She will get a CBC to check how severe her bleeding has been.  Please see the "Patient Instructions" section for addition details about the plan.  Owens Loffler, MD [facility] 05/15/2020, 9:03 AM   Total time on date of encounter was 30 minutes (this included time spent preparing to see the patient reviewing records; obtaining and/or reviewing separately obtained history; performing a medically appropriate exam and/or evaluation; counseling and educating the patient and family if present; ordering medications, tests or procedures if applicable; and documenting clinical information in the health record).

## 2020-05-15 NOTE — Patient Instructions (Addendum)
If you are age 67 or older, your body mass index should be between 23-30. Your Body mass index is 59.61 kg/m. If this is out of the aforementioned range listed, please consider follow up with your Primary Care Provider.  If you are age 68 or younger, your body mass index should be between 19-25. Your Body mass index is 59.61 kg/m. If this is out of the aformentioned range listed, please consider follow up with your Primary Care Provider.   Your provider has requested that you go to the basement level for lab work before leaving today. Press "B" on the elevator. The lab is located at the first door on the left as you exit the elevator.  You have been scheduled for a colonoscopy. Please follow written instructions given to you at your visit today.  Please pick up your prep supplies at the pharmacy within the next 1-3 days. If you use inhalers (even only as needed), please bring them with you on the day of your procedure.  Thank you for entrusting me with your care and choosing [facility].  Dr Ardis Hughs

## 2020-05-17 NOTE — Patient Instructions (Addendum)
  Health Maintenance Due  Topic Date Due  . DEXA SCAN -getting closer to 250 goal to be able to do this  Never done  . PNA vac Low Risk Adult - recommend prevnar 20 at pharmacy- this should be your final pneumonia shot and if you dont get there- we may have available by next visit 01/06/2020   Blood pressure elevated today but has not had a chance to take her medicine yet today-she will take this when she gets home.  I asked her to update me with home blood pressures over the next week-if elevated after medicine in the system we may need to adjust regimen. Continue enalapril 5 mg for now  due to prior deficiency asked team to give her a 1x b12 injection and then have her resume oral b12 at home starting next week- at least 1000 mcg per day- team will also add to med list   we will plan to recheck vitamin D at next visit- when she runs out of high dose recommended taking 1000 units per day of vitamin D  Recommended follow up: Return in about 6 months (around 11/21/2020) for physical or sooner if needed.

## 2020-05-17 NOTE — Progress Notes (Signed)
Phone [phone] In person visit   Subjective:   Melissa Kim is a 65 y.o. year old very pleasant female patient who presents for/with See problem oriented charting Chief Complaint  Patient presents with  . Hypothyroidism  . Hypertension  . Hyperlipidemia  . Hyperglycemia  . B12 Deficiency   This visit occurred during the SARS-CoV-2 public health emergency.  Safety protocols were in place, including screening questions prior to the visit, additional usage of staff PPE, and extensive cleaning of exam room while observing appropriate contact time as indicated for disinfecting solutions.   Past Medical History-  Patient Active Problem List   Diagnosis Date Noted  . Morbid obesity ([place]) 11/30/2007    Priority: High  . Hyperlipidemia, unspecified 11/19/2018    Priority: Medium  . Hypothyroidism, unspecified 11/19/2018    Priority: Medium  . History of uterine cancer 03/28/2014    Priority: Medium  . Hyperglycemia 03/28/2014    Priority: Medium  . Overactive bladder 09/24/2009    Priority: Medium  . Osteopenia 12/05/2008    Priority: Medium  . Essential hypertension 06/01/2007    Priority: Medium  . Asthma 09/29/2006    Priority: Medium  . Psoriasis 09/29/2006    Priority: Medium  . B12 deficiency 11/22/2019    Priority: Low  . History of total hysterectomy 08/27/2017    Priority: Low  . Osteoarthritis, knee 03/28/2014    Priority: Low  . Vitamin D deficiency 11/20/2009    Priority: Low  . Venous (peripheral) insufficiency 09/29/2006    Priority: Low  . GERD 09/29/2006    Priority: Low  . History of colonic polyps   . Rectal bleeding     Medications- reviewed and updated Current Outpatient Medications  Medication Sig Dispense Refill  . acetaminophen (TYLENOL) 500 MG tablet Take 2,000 mg by mouth daily as needed for moderate pain or headache.     . albuterol (VENTOLIN HFA) 108 (90 Base) MCG/ACT inhaler Inhale 2 puffs into the lungs every 6 (six) hours as  needed. For shortness of breath 18 g 5  . diphenhydrAMINE (BENADRYL) 25 mg capsule Take 25 mg by mouth daily as needed for allergies.    Marland Kitchen enalapril (VASOTEC) 5 MG tablet Take 1 tablet (5 mg total) by mouth daily. 90 tablet 3  . Menthol, Topical Analgesic, (ICY HOT EX) Apply 1 application topically daily as needed (knee pain).    . Tetrahydrozoline HCl (REDNESS RELIEVER EYE DROPS OP) Place 1 drop into both eyes daily as needed (redness).    . Vitamin D, Ergocalciferol, (DRISDOL) 1.25 MG (50000 UNIT) CAPS capsule Take 1 capsule (50,000 Units total) by mouth every 7 (seven) days. 12 capsule 0  . EUTHYROX 50 MCG tablet Take 1 tablet by mouth once daily 90 tablet 0   No current facility-administered medications for this visit.     Objective:  BP (!) 160/82   Pulse 73   Temp 98.2 F (36.8 C) (Temporal)   Ht 4\' 10"  (1.473 m)   Wt 282 lb 9.6 oz (128.2 kg)   SpO2 96%   BMI 59.06 kg/m  Gen: NAD, resting comfortably CV: RRR no murmurs rubs or gallops Lungs: CTAB no crackles, wheeze, rhonchi Ext: trace to 1+ edema- doesn't tolerate compression stockings Skin: warm, dry    Assessment and Plan   #hyperlipidemia S: Medication: none- has wanted to wcontinue to work on weight loss Lab Results  Component Value Date   CHOL 204 (H) 11/22/2019   HDL 67 11/22/2019  LDLCALC 113 (H) 11/22/2019   LDLDIRECT 118.0 04/03/2015   TRIG 125 11/22/2019   CHOLHDL 3.0 11/22/2019   A/P: slightly elevated but still making good progress on weight los- we will recheck at 6 month physical  # Hyperglycemia/insulin resistance/prediabetes S:  Medication: none Exercise and diet- down another 7 lbs from last visit! Continues to work on portion control  Lab Results  Component Value Date   HGBA1C 5.5 09/30/2019   HGBA1C 5.8 11/19/2018   HGBA1C 5.8 08/19/2018   A/P: was out of prediabetes range last visit- will wait and recheck at 6 months  #hypothyroidism S: compliant On thyroid medication- Euthrox  54mcg Lab Results  Component Value Date   TSH 3.07 11/22/2019   A/P:Stable. Continue current medications. Recheck tsh next visit   #hypertension S: medication: Enalapril 5Mg - hasnt had meds yet today. Was getting lightheaded on 10 mg in past Home readings #s: not checking recently BP Readings from Last 3 Encounters:  05/22/20 (!) 160/82  05/15/20 (!) 166/78  11/22/19 136/62  A/P: Blood pressure elevated today but has not had a chance to take her medicine yet today-she will take this when she gets home.  I asked her to update me with home blood pressures over the next week-if elevated after medicine in the system we may need to adjust regimen. Continue enalapril 5 mg for now - may need to consider 5 mg in AM and 2.5 mg before bed.   # B12 deficiency S: Current treatment/medication (oral vs. IM): not on medicine  Lab Results  Component Value Date   VITAMINB12 210 11/22/2019   A/P: due to prior deficiency asked team to give her a 1x b12 injection and then have her resume oral b12 at home starting next week- at least 1000 mcg per day- team will also add to med list   #Vitamin D deficiency S: Medication: took high dose vitamin D once a week for 12 weeks. She states they refilled this.  Last vitamin D Lab Results  Component Value Date   VD25OH 23 (L) 11/22/2019  A/P: we will plan to recheck vitamin D at next visit- when she runs out of high dose recommended taking 1000 units per day of vitamin D  #scheduled in may for removal of cyst on top of her left head- with Dr. Elvera Lennox  Recommended follow up: Return in about 6 months (around 11/21/2020) for physical or sooner if needed. Future Appointments  Date Time Provider [place]  07/09/2020  3:05 PM [facility] None  02/07/2021  8:45 AM [facility] HEALTH COACH [facility]   Lab/Order associations:   ICD-10-CM   1. Essential hypertension  I10   2. Gastroesophageal reflux disease without esophagitis  K21.9   3.  Hypothyroidism, unspecified type  E03.9   4. Vitamin D deficiency  E55.9   5. Hyperlipidemia, unspecified hyperlipidemia type  E78.5   6. Morbid obesity ([place]) Chronic E66.01   7. B12 deficiency  E53.8   8. Hyperglycemia  R73.9     No orders of the defined types were placed in this encounter.  Return precautions advised.  Garret Reddish, MD

## 2020-05-22 ENCOUNTER — Other Ambulatory Visit: Payer: Self-pay

## 2020-05-22 ENCOUNTER — Ambulatory Visit (INDEPENDENT_AMBULATORY_CARE_PROVIDER_SITE_OTHER): Payer: PPO | Admitting: Family Medicine

## 2020-05-22 ENCOUNTER — Encounter: Payer: Self-pay | Admitting: Family Medicine

## 2020-05-22 VITALS — BP 160/82 | HR 73 | Temp 98.2°F | Ht <= 58 in | Wt 282.6 lb

## 2020-05-22 DIAGNOSIS — K219 Gastro-esophageal reflux disease without esophagitis: Secondary | ICD-10-CM

## 2020-05-22 DIAGNOSIS — R739 Hyperglycemia, unspecified: Secondary | ICD-10-CM | POA: Diagnosis not present

## 2020-05-22 DIAGNOSIS — E559 Vitamin D deficiency, unspecified: Secondary | ICD-10-CM | POA: Diagnosis not present

## 2020-05-22 DIAGNOSIS — E538 Deficiency of other specified B group vitamins: Secondary | ICD-10-CM | POA: Diagnosis not present

## 2020-05-22 DIAGNOSIS — I10 Essential (primary) hypertension: Secondary | ICD-10-CM | POA: Diagnosis not present

## 2020-05-22 DIAGNOSIS — J4520 Mild intermittent asthma, uncomplicated: Secondary | ICD-10-CM

## 2020-05-22 DIAGNOSIS — E785 Hyperlipidemia, unspecified: Secondary | ICD-10-CM | POA: Diagnosis not present

## 2020-05-22 DIAGNOSIS — E039 Hypothyroidism, unspecified: Secondary | ICD-10-CM

## 2020-05-22 MED ORDER — CYANOCOBALAMIN 1000 MCG/ML IJ SOLN
1000.0000 ug | Freq: Once | INTRAMUSCULAR | Status: AC
  Administered 2020-05-22: 1000 ug via INTRAMUSCULAR

## 2020-05-22 NOTE — Addendum Note (Signed)
Addended by: Doran Clay A on: 05/22/2020 12:03 PM   Modules accepted: Orders

## 2020-06-19 DIAGNOSIS — L7211 Pilar cyst: Secondary | ICD-10-CM | POA: Diagnosis not present

## 2020-07-09 ENCOUNTER — Other Ambulatory Visit (HOSPITAL_COMMUNITY)
Admission: RE | Admit: 2020-07-09 | Discharge: 2020-07-09 | Disposition: A | Discharge status: Home / Self Care | Payer: PPO | Source: Ambulatory Visit | Attending: Gastroenterology | Admitting: Gastroenterology

## 2020-07-09 DIAGNOSIS — Z20822 Contact with and (suspected) exposure to covid-19: Secondary | ICD-10-CM | POA: Insufficient documentation

## 2020-07-09 DIAGNOSIS — Z01812 Encounter for preprocedural laboratory examination: Secondary | ICD-10-CM | POA: Diagnosis not present

## 2020-07-09 LAB — SARS CORONAVIRUS 2 (TAT 6-24 HRS): SARS Coronavirus 2: NEGATIVE

## 2020-07-09 NOTE — Progress Notes (Signed)
Attempted to obtain medical history via telephone, unable to reach at this time. I left a voicemail to return pre surgical testing department's phone call.  

## 2020-07-11 ENCOUNTER — Encounter (HOSPITAL_COMMUNITY): Payer: Self-pay | Admitting: Gastroenterology

## 2020-07-12 ENCOUNTER — Ambulatory Visit (HOSPITAL_COMMUNITY): Payer: PPO | Admitting: Certified Registered Nurse Anesthetist

## 2020-07-12 ENCOUNTER — Ambulatory Visit (HOSPITAL_COMMUNITY)
Admission: RE | Admit: 2020-07-12 | Discharge: 2020-07-12 | Disposition: A | Discharge status: Home / Self Care | Payer: PPO | Attending: Gastroenterology | Admitting: Gastroenterology

## 2020-07-12 ENCOUNTER — Other Ambulatory Visit: Payer: Self-pay

## 2020-07-12 ENCOUNTER — Encounter (HOSPITAL_COMMUNITY)
Admission: RE | Disposition: A | Discharge status: Home / Self Care | Payer: Self-pay | Source: Home / Self Care | Attending: Gastroenterology

## 2020-07-12 ENCOUNTER — Encounter (HOSPITAL_COMMUNITY): Payer: Self-pay | Admitting: Gastroenterology

## 2020-07-12 DIAGNOSIS — K219 Gastro-esophageal reflux disease without esophagitis: Secondary | ICD-10-CM | POA: Insufficient documentation

## 2020-07-12 DIAGNOSIS — Z886 Allergy status to analgesic agent status: Secondary | ICD-10-CM | POA: Diagnosis not present

## 2020-07-12 DIAGNOSIS — I10 Essential (primary) hypertension: Secondary | ICD-10-CM | POA: Diagnosis not present

## 2020-07-12 DIAGNOSIS — Z91048 Other nonmedicinal substance allergy status: Secondary | ICD-10-CM | POA: Insufficient documentation

## 2020-07-12 DIAGNOSIS — K644 Residual hemorrhoidal skin tags: Secondary | ICD-10-CM | POA: Diagnosis not present

## 2020-07-12 DIAGNOSIS — Z91018 Allergy to other foods: Secondary | ICD-10-CM | POA: Insufficient documentation

## 2020-07-12 DIAGNOSIS — Z823 Family history of stroke: Secondary | ICD-10-CM | POA: Diagnosis not present

## 2020-07-12 DIAGNOSIS — Z86010 Personal history of colonic polyps: Secondary | ICD-10-CM | POA: Diagnosis not present

## 2020-07-12 DIAGNOSIS — K921 Melena: Secondary | ICD-10-CM | POA: Diagnosis not present

## 2020-07-12 DIAGNOSIS — K648 Other hemorrhoids: Secondary | ICD-10-CM | POA: Diagnosis not present

## 2020-07-12 DIAGNOSIS — K625 Hemorrhage of anus and rectum: Secondary | ICD-10-CM

## 2020-07-12 DIAGNOSIS — Z888 Allergy status to other drugs, medicaments and biological substances status: Secondary | ICD-10-CM | POA: Insufficient documentation

## 2020-07-12 DIAGNOSIS — Z836 Family history of other diseases of the respiratory system: Secondary | ICD-10-CM | POA: Insufficient documentation

## 2020-07-12 DIAGNOSIS — Z8249 Family history of ischemic heart disease and other diseases of the circulatory system: Secondary | ICD-10-CM | POA: Diagnosis not present

## 2020-07-12 DIAGNOSIS — Z800 Family history of malignant neoplasm of digestive organs: Secondary | ICD-10-CM | POA: Diagnosis not present

## 2020-07-12 DIAGNOSIS — K5730 Diverticulosis of large intestine without perforation or abscess without bleeding: Secondary | ICD-10-CM | POA: Insufficient documentation

## 2020-07-12 DIAGNOSIS — K5731 Diverticulosis of large intestine without perforation or abscess with bleeding: Secondary | ICD-10-CM | POA: Diagnosis not present

## 2020-07-12 HISTORY — DX: Other specified postprocedural states: Z98.890

## 2020-07-12 HISTORY — DX: Nausea with vomiting, unspecified: R11.2

## 2020-07-12 HISTORY — PX: FLEXIBLE SIGMOIDOSCOPY: SHX5431

## 2020-07-12 SURGERY — SIGMOIDOSCOPY, FLEXIBLE
Anesthesia: Monitor Anesthesia Care

## 2020-07-12 MED ORDER — HYDROCORTISONE ACETATE 25 MG RE SUPP: 25.0000 mg | Freq: Every day | RECTAL | 6 refills | Status: DC

## 2020-07-12 MED ORDER — PROPOFOL 10 MG/ML IV BOLUS
INTRAVENOUS | Status: DC | PRN
  Administered 2020-07-12 (×2): 20 mg via INTRAVENOUS

## 2020-07-12 MED ORDER — LIDOCAINE 2% (20 MG/ML) 5 ML SYRINGE
INTRAMUSCULAR | Status: DC | PRN
  Administered 2020-07-12: 100 mg via INTRAVENOUS

## 2020-07-12 MED ORDER — SODIUM CHLORIDE 0.9 % IV SOLN: INTRAVENOUS | Status: DC

## 2020-07-12 MED ORDER — PROPOFOL 1000 MG/100ML IV EMUL
INTRAVENOUS | Status: AC
  Filled 2020-07-12: qty 100

## 2020-07-12 MED ORDER — PROPOFOL 500 MG/50ML IV EMUL
INTRAVENOUS | Status: DC | PRN
  Administered 2020-07-12: 125 ug/kg/min via INTRAVENOUS

## 2020-07-12 MED ORDER — LACTATED RINGERS IV SOLN: INTRAVENOUS | Status: DC | PRN

## 2020-07-12 NOTE — H&P (Signed)
I called this a 'consult note' by accident.  See epic

## 2020-07-12 NOTE — Discharge Instructions (Signed)

## 2020-07-12 NOTE — Transfer of Care (Signed)
Immediate Anesthesia Transfer of Care Note  Patient: Melissa Kim  Procedure(s) Performed: FLEXIBLE SIGMOIDOSCOPY (N/A )  Patient Location: Endoscopy Unit  Anesthesia Type:MAC  Level of Consciousness: drowsy and patient cooperative  Airway & Oxygen Therapy: Patient Spontanous Breathing and Patient connected to face mask oxygen  Post-op Assessment: Report given to RN and Post -op Vital signs reviewed and stable  Post vital signs: Reviewed and stable  Last Vitals:  Vitals Value Taken Time  BP    Temp    Pulse 69 07/12/20 0848  Resp 9 07/12/20 0848  SpO2 98 % 07/12/20 0848  Vitals shown include unvalidated device data.  Last Pain:  Vitals:   07/12/20 0805  TempSrc: Oral  PainSc: 0-No pain         Complications: No complications documented.

## 2020-07-12 NOTE — Anesthesia Preprocedure Evaluation (Signed)
Anesthesia Evaluation  Patient identified by MRN, date of birth, ID band Patient awake    Reviewed: Allergy & Precautions, NPO status , Patient's Chart, lab work & pertinent test results  Airway Mallampati: II  TM Distance: <3 FB Neck ROM: Full    Dental no notable dental hx.    Pulmonary neg pulmonary ROS,    breath sounds clear to auscultation + decreased breath sounds      Cardiovascular hypertension, Normal cardiovascular exam Rhythm:Regular Rate:Normal     Neuro/Psych negative neurological ROS  negative psych ROS   GI/Hepatic negative GI ROS, Neg liver ROS,   Endo/Other  Hypothyroidism Morbid obesity  Renal/GU negative Renal ROS  negative genitourinary   Musculoskeletal negative musculoskeletal ROS (+)   Abdominal (+) + obese,   Peds negative pediatric ROS (+)  Hematology negative hematology ROS (+)   Anesthesia Other Findings   Reproductive/Obstetrics negative OB ROS                             Anesthesia Physical Anesthesia Plan  ASA: III  Anesthesia Plan: MAC   Post-op Pain Management:    Induction: Intravenous  PONV Risk Score and Plan: 2 and Propofol infusion and Treatment may vary due to age or medical condition  Airway Management Planned: Simple Face Mask  Additional Equipment:   Intra-op Plan:   Post-operative Plan:   Informed Consent: I have reviewed the patients History and Physical, chart, labs and discussed the procedure including the risks, benefits and alternatives for the proposed anesthesia with the patient or authorized representative who has indicated his/her understanding and acceptance.     Dental advisory given  Plan Discussed with: CRNA and Surgeon  Anesthesia Plan Comments:         Anesthesia Quick Evaluation

## 2020-07-12 NOTE — Op Note (Signed)
[facility] Patient Name: Melissa Kim Procedure Date: 07/12/2020 MRN: [id number] Attending MD: Milus Banister , MD Date of Birth: [birth year] CSN: [id number] Age: 65 Admit Type: Outpatient Procedure:                Flexible Sigmoidoscopy Indications:              Hematochezia, nearly daily, not anemic: history of                            pre-cancerous (adenomatous) colon polyps                            (pathologically proven adenomatous polyps removed                            in 2000 and 2002 by [facility], 2005 colonoscopy showed no                            polyps).Colonoscopy March 2010 found                            diverticulosis throughout the colon, no polyps or                            cancers. Colonoscopy September 2020 showed pan                            diverticulosis. Examination was otherwise normal. Providers:                Milus Banister, MD, Burtis Junes, RN, Fransico Setters                            [place], Technician Referring MD:              Medicines:                Monitored Anesthesia Care Complications:            No immediate complications. Estimated blood loss:                            None. Estimated Blood Loss:     Estimated blood loss: none. Procedure:                Pre-Anesthesia Assessment:                           - Prior to the procedure, a History and Physical                            was performed, and patient medications and                            allergies were reviewed. The patient's tolerance of                            previous anesthesia was also reviewed. The risks  and benefits of the procedure and the sedation                            options and risks were discussed with the patient.                            All questions were answered, and informed consent                            was obtained. Prior Anticoagulants: The patient has                            taken no  previous anticoagulant or antiplatelet                            agents. ASA Grade Assessment: IV - A patient with                            severe systemic disease that is a constant threat                            to life. After reviewing the risks and benefits,                            the patient was deemed in satisfactory condition to                            undergo the procedure.                           After obtaining informed consent, the scope was                            passed under direct vision. The CF-HQ190L ([id number])                            Olympus colonoscope was introduced through the anus                            and advanced to the the splenic flexure. The                            flexible sigmoidoscopy was accomplished without                            difficulty. The patient tolerated the procedure                            well. The quality of the bowel preparation was good. Scope In: 8:37:30 AM Scope Out: 8:41:11 AM Total Procedure Duration: 0 hours 3 minutes 41 seconds  Findings:      Multiple small and large-mouthed diverticula were found in the left       colon.      External  and internal hemorrhoids were found. The hemorrhoids were       small. The internal hemorrhoids were somewhat inflammed.      The exam was otherwise without abnormality. Impression:               - Diverticulosis in the left colon.                           - External and (inflammed) internal hemorrhoids.                           - The examination was otherwise normal.                           - No specimens collected. Moderate Sedation:      Not Applicable - Patient had care per Anesthesia. Recommendation:           - Discharge patient to home.                           - Rectal steroid suppository for 2 weeks, then PRN.                            New script written today.                           - Please call Dr. Ardis Hughs' office in 4-6 weeks to                             report on your progress. Procedure Code(s):        --- Professional ---                           [phone], Sigmoidoscopy, flexible; diagnostic,                            including collection of specimen(s) by brushing or                            washing, when performed (separate procedure) Diagnosis Code(s):        --- Professional ---                           K64.8, Other hemorrhoids                           K92.1, Melena (includes Hematochezia)                           K57.30, Diverticulosis of large intestine without                            perforation or abscess without bleeding CPT copyright 2019 American Medical Association. All rights reserved. The codes documented in this report are preliminary and upon coder review may  be revised to meet current compliance requirements. Milus Banister, MD 07/12/2020 9:01:47 AM This report has been signed electronically. Number of  Addenda: 0

## 2020-07-12 NOTE — Consult Note (Signed)
HPI: This is a very pleasant 65 yo woman  history of pre-cancerous (adenomatous) colon polyps (pathologically proven adenomatous polyps removed in 2000 and 2002 by [facility], 2005 colonoscopy showed no polyps).Colonoscopy March 2010 found diverticulosis throughout the colon, no polyps or cancers.  Colonoscopy September 2020 showed pan diverticulosis.  Examination was otherwise normal.  Has had increasing but minor rectal bleeding lately.   ROS: complete GI ROS as described in HPI, all other review negative.  Constitutional:  No unintentional weight loss   Past Medical History:  Diagnosis Date  . Asthma   . Cancer ([place])   . Complication of anesthesia    when Ether being used- almost did not wake up from Anesthesia  . DIVERTICULITIS, HX OF 05/30/2008  . Diverticulosis   . DIVERTICULOSIS, COLON 05/30/2008  . Family history of adverse reaction to anesthesia    sister -hard to keep asleep with anesthesia  . Fx foot bone NEC-closed   . GERD (gastroesophageal reflux disease)   . Hypertension   . Obesity   . PONV (postoperative nausea and vomiting)   . Psoriasis   . STRESS FRACTURE, FOOT 12/05/2008   reports multiple  . Venous stasis dermatitis     Past Surgical History:  Procedure Laterality Date  . ABDOMINAL HYSTERECTOMY     complete reported including cervix. For uterine cancer. released by GYN.   Marland Kitchen APPENDECTOMY    . arthroscopic of knee    . CHOLECYSTECTOMY    . COLONOSCOPY WITH PROPOFOL N/A 11/04/2018   Procedure: COLONOSCOPY WITH PROPOFOL;  Surgeon: Milus Banister, MD;  Location: [facility] ENDOSCOPY;  Service: Endoscopy;  Laterality: N/A;  . TONSILLECTOMY      No current facility-administered medications for this encounter.    Allergies as of 05/15/2020 - Review Complete 05/15/2020  Allergen Reaction Noted  . Aspirin    . Ibuprofen    . Losartan potassium-hctz    . Peg 3350-electrolytes Nausea And Vomiting 04/11/2008  . Strawberry extract Hives and Itching 05/23/2013  .  Adhesive [tape] Rash 11/01/2018    Family History  Problem Relation Age of Onset  . Heart disease Father        MI in late 47s, early 60s, smoker  . Hypertension Mother   . Pancreatic cancer Mother   . Stroke Maternal Grandmother   . Heart attack Maternal Grandfather        thinks in 41s  . Stroke Paternal Grandmother   . Tuberculosis Paternal Grandmother   . Heart attack Paternal Grandfather   . Colon cancer Neg Hx   . Esophageal cancer Neg Hx     Social History   Socioeconomic History  . Marital status: Married    Spouse name: Not on file  . Number of children: Not on file  . Years of education: Not on file  . Highest education level: Not on file  Occupational History  . Occupation: retired  Tobacco Use  . Smoking status: Never Smoker  . Smokeless tobacco: Never Used  Vaping Use  . Vaping Use: Never used  Substance and Sexual Activity  . Alcohol use: No  . Drug use: No  . Sexual activity: Not on file  Other Topics Concern  . Not on file  Social History Narrative   Married (patient of Dr. 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  Difficulty of Paying Living Expenses: Not hard at all  Food Insecurity: No Food Insecurity  . Worried About Charity fundraiser in the Last Year: Never true  . Ran Out of Food in the Last Year: Never true  Transportation Needs: No Transportation Needs  . Lack of Transportation (Medical): No  . Lack of Transportation (Non-Medical): No  Physical Activity: Inactive  . Days of Exercise per Week: 0 days  . Minutes of Exercise per Session: 0 min  Stress: No Stress Concern Present  . Feeling of Stress : Not at all  Social Connections: Moderately Isolated  . Frequency of Communication with Friends and Family: More than three times a week  . Frequency of Social Gatherings with Friends and Family: Once a week  .  Attends Religious Services: Never  . Active Member of Clubs or Organizations: No  . Attends Archivist Meetings: Never  . Marital Status: Married  Human resources officer Violence: Not At Risk  . Fear of Current or Ex-Partner: No  . Emotionally Abused: No  . Physically Abused: No  . Sexually Abused: No     Physical Exam: Ht 4\' 10"  (1.473 m)   Wt 128.8 kg   BMI 59.36 kg/m  Constitutional: generally well-appearing Psychiatric: alert and oriented x3 Abdomen: soft, nontender, nondistended, no obvious ascites, no peritoneal signs, normal bowel sounds No peripheral edema noted in lower extremities  Assessment and plan: 65 y.o. female with morbid obesity, rectal bleeding,   For flex sig today  Please see the "Patient Instructions" section for addition details about the plan.  Owens Loffler, MD [facility] 07/12/2020, 7:30 AM

## 2020-07-12 NOTE — Anesthesia Postprocedure Evaluation (Signed)
Anesthesia Post Note  Patient: [name]  Procedure(s) Performed: FLEXIBLE SIGMOIDOSCOPY (N/A )     Patient location during evaluation: PACU Anesthesia Type: MAC Level of consciousness: awake and alert Pain management: pain level controlled Vital Signs Assessment: post-procedure vital signs reviewed and stable Respiratory status: spontaneous breathing, nonlabored ventilation, respiratory function stable and patient connected to nasal cannula oxygen Cardiovascular status: stable and blood pressure returned to baseline Postop Assessment: no apparent nausea or vomiting Anesthetic complications: no   No complications documented.  Last Vitals:  Vitals:   07/12/20 0900 07/12/20 0910  BP: 130/65 (!) 145/55  Pulse: 67 (!) 47  Resp: (!) 24 13  Temp:    SpO2: 100% 100%    Last Pain:  Vitals:   07/12/20 0910  TempSrc:   PainSc: 0-No pain                 Bernadene Garside S

## 2020-07-14 ENCOUNTER — Other Ambulatory Visit: Payer: Self-pay | Admitting: Family Medicine

## 2020-07-15 ENCOUNTER — Encounter (HOSPITAL_COMMUNITY): Payer: Self-pay | Admitting: Gastroenterology

## 2020-08-09 ENCOUNTER — Telehealth: Payer: Self-pay | Admitting: Gastroenterology

## 2020-08-09 DIAGNOSIS — K625 Hemorrhage of anus and rectum: Secondary | ICD-10-CM

## 2020-08-09 NOTE — Telephone Encounter (Signed)
Inbound call from patient wants update how she is doing after procedure 07/11/20. Patient states she is going pretty good. She does have a little bleeding not as bad as before. She says she did not get hydrocortisone suppository that was called in because they were over $90 but did get over the counter. States she still have hemrrhoids they are not as bad either.   Contact number [phone]

## 2020-08-14 NOTE — Telephone Encounter (Signed)
Okay, thanks.  Lets get a CBC sometime this week.  If she is not bleeding enough to become anemic then I think there is no reason to do any type of procedures for her hemorrhoids.

## 2020-08-14 NOTE — Telephone Encounter (Signed)
The patient has been notified of this information and all questions answered. CBC added

## 2020-08-17 ENCOUNTER — Other Ambulatory Visit (INDEPENDENT_AMBULATORY_CARE_PROVIDER_SITE_OTHER): Payer: PPO

## 2020-08-17 DIAGNOSIS — K625 Hemorrhage of anus and rectum: Secondary | ICD-10-CM | POA: Diagnosis not present

## 2020-08-17 LAB — CBC WITH DIFFERENTIAL/PLATELET
Basophils Absolute: 0.1 10*3/uL (ref 0.0–0.1)
Basophils Relative: 0.7 % (ref 0.0–3.0)
Eosinophils Absolute: 0.2 10*3/uL (ref 0.0–0.7)
Eosinophils Relative: 2.9 % (ref 0.0–5.0)
HCT: 42.6 % (ref 36.0–46.0)
Hemoglobin: 14.1 g/dL (ref 12.0–15.0)
Lymphocytes Relative: 25.9 % (ref 12.0–46.0)
Lymphs Abs: 2.1 10*3/uL (ref 0.7–4.0)
MCHC: 33 g/dL (ref 30.0–36.0)
MCV: 89.6 fl (ref 78.0–100.0)
Monocytes Absolute: 0.5 10*3/uL (ref 0.1–1.0)
Monocytes Relative: 6.7 % (ref 3.0–12.0)
Neutro Abs: 5.1 10*3/uL (ref 1.4–7.7)
Neutrophils Relative %: 63.8 % (ref 43.0–77.0)
Platelets: 232 10*3/uL (ref 150.0–400.0)
RBC: 4.76 Mil/uL (ref 3.87–5.11)
RDW: 14.9 % (ref 11.5–15.5)
WBC: 7.9 10*3/uL (ref 4.0–10.5)

## 2020-09-14 ENCOUNTER — Emergency Department (HOSPITAL_COMMUNITY): Payer: PPO

## 2020-09-14 ENCOUNTER — Emergency Department (HOSPITAL_COMMUNITY)
Admission: EM | Admit: 2020-09-14 | Discharge: 2020-09-15 | Disposition: A | Discharge status: Home / Self Care | Payer: PPO | Attending: Emergency Medicine | Admitting: Emergency Medicine

## 2020-09-14 ENCOUNTER — Other Ambulatory Visit: Payer: Self-pay

## 2020-09-14 DIAGNOSIS — J45909 Unspecified asthma, uncomplicated: Secondary | ICD-10-CM | POA: Insufficient documentation

## 2020-09-14 DIAGNOSIS — M25562 Pain in left knee: Secondary | ICD-10-CM | POA: Diagnosis not present

## 2020-09-14 DIAGNOSIS — M7989 Other specified soft tissue disorders: Secondary | ICD-10-CM | POA: Diagnosis not present

## 2020-09-14 DIAGNOSIS — Z043 Encounter for examination and observation following other accident: Secondary | ICD-10-CM | POA: Diagnosis not present

## 2020-09-14 DIAGNOSIS — M1711 Unilateral primary osteoarthritis, right knee: Secondary | ICD-10-CM | POA: Diagnosis not present

## 2020-09-14 DIAGNOSIS — S99912A Unspecified injury of left ankle, initial encounter: Secondary | ICD-10-CM | POA: Diagnosis not present

## 2020-09-14 DIAGNOSIS — R0902 Hypoxemia: Secondary | ICD-10-CM | POA: Diagnosis not present

## 2020-09-14 DIAGNOSIS — I10 Essential (primary) hypertension: Secondary | ICD-10-CM | POA: Diagnosis not present

## 2020-09-14 DIAGNOSIS — E039 Hypothyroidism, unspecified: Secondary | ICD-10-CM | POA: Insufficient documentation

## 2020-09-14 DIAGNOSIS — R55 Syncope and collapse: Secondary | ICD-10-CM | POA: Insufficient documentation

## 2020-09-14 DIAGNOSIS — Z8542 Personal history of malignant neoplasm of other parts of uterus: Secondary | ICD-10-CM | POA: Diagnosis not present

## 2020-09-14 DIAGNOSIS — Z79899 Other long term (current) drug therapy: Secondary | ICD-10-CM | POA: Insufficient documentation

## 2020-09-14 DIAGNOSIS — M19071 Primary osteoarthritis, right ankle and foot: Secondary | ICD-10-CM | POA: Diagnosis not present

## 2020-09-14 DIAGNOSIS — W19XXXA Unspecified fall, initial encounter: Secondary | ICD-10-CM | POA: Diagnosis not present

## 2020-09-14 LAB — CBC WITH DIFFERENTIAL/PLATELET
Abs Immature Granulocytes: 0.05 10*3/uL (ref 0.00–0.07)
Basophils Absolute: 0.1 10*3/uL (ref 0.0–0.1)
Basophils Relative: 1 %
Eosinophils Absolute: 0.1 10*3/uL (ref 0.0–0.5)
Eosinophils Relative: 1 %
HCT: 44.6 % (ref 36.0–46.0)
Hemoglobin: 14.1 g/dL (ref 12.0–15.0)
Immature Granulocytes: 1 %
Lymphocytes Relative: 15 %
Lymphs Abs: 1.3 10*3/uL (ref 0.7–4.0)
MCH: 30.1 pg (ref 26.0–34.0)
MCHC: 31.6 g/dL (ref 30.0–36.0)
MCV: 95.3 fL (ref 80.0–100.0)
Monocytes Absolute: 0.5 10*3/uL (ref 0.1–1.0)
Monocytes Relative: 6 %
Neutro Abs: 6.6 10*3/uL (ref 1.7–7.7)
Neutrophils Relative %: 76 %
Platelets: 249 10*3/uL (ref 150–400)
RBC: 4.68 MIL/uL (ref 3.87–5.11)
RDW: 13.9 % (ref 11.5–15.5)
WBC: 8.6 10*3/uL (ref 4.0–10.5)
nRBC: 0 % (ref 0.0–0.2)

## 2020-09-14 LAB — BASIC METABOLIC PANEL
Anion gap: 11 (ref 5–15)
BUN: 11 mg/dL (ref 8–23)
CO2: 22 mmol/L (ref 22–32)
Calcium: 9 mg/dL (ref 8.9–10.3)
Chloride: 105 mmol/L (ref 98–111)
Creatinine, Ser: 0.78 mg/dL (ref 0.44–1.00)
GFR, Estimated: >60 mL/min (ref >60)
Glucose, Bld: 153 mg/dL — ABNORMAL HIGH (ref 70–99)
Potassium: 3.8 mmol/L (ref 3.5–5.1)
Sodium: 138 mmol/L (ref 135–145)

## 2020-09-14 NOTE — ED Triage Notes (Signed)
Pt witnessed syncopal episode approx 1hr ago at [facility], seemingly overheated while standing in line, did not hit head. No thinners, ambulatory w walker, c/o bilateral knee & ankle pain. No obvious deformity noted  VSS 20 L hand

## 2020-09-14 NOTE — ED Provider Notes (Signed)
Emergency Medicine Provider Triage Evaluation Note  Melissa Kim , a 65 y.o. female  was evaluated in triage.  Pt complains of syncope. States [facility] and got hot and passed out. She did not eat this morning. Denies head trauma. Denies hx anticoagulation.  Review of Systems  Positive: Syncope, bilat knee pain (chronic, unchanged), bilat ankle pain Negative: Head injury  Physical Exam  BP 138/75   Pulse 75   Temp (!) 97.5 F (36.4 C) (Oral)   Resp 20   SpO2 95%  Gen:   Awake, no distress   Resp:  Normal effort  MSK:   Moves extremities without difficulty  Other:    Medical Decision Making  Medically screening exam initiated at 4:46 PM.  Appropriate orders placed.  Melissa Kim was informed that the remainder of the evaluation will be completed by another provider, this initial triage assessment does not replace that evaluation, and the importance of remaining in the ED until their evaluation is complete.     Rodney Booze, PA-C 09/14/20 1649    Lajean Saver, MD 09/15/20 1258

## 2020-09-15 ENCOUNTER — Emergency Department (HOSPITAL_COMMUNITY): Payer: PPO

## 2020-09-15 DIAGNOSIS — M7989 Other specified soft tissue disorders: Secondary | ICD-10-CM | POA: Diagnosis not present

## 2020-09-15 DIAGNOSIS — M1711 Unilateral primary osteoarthritis, right knee: Secondary | ICD-10-CM | POA: Diagnosis not present

## 2020-09-15 DIAGNOSIS — R55 Syncope and collapse: Secondary | ICD-10-CM | POA: Diagnosis not present

## 2020-09-15 MED ORDER — ACETAMINOPHEN 325 MG PO TABS
650.0000 mg | ORAL_TABLET | Freq: Once | ORAL | Status: AC
  Administered 2020-09-15: 650 mg via ORAL
  Filled 2020-09-15: qty 2

## 2020-09-15 NOTE — ED Notes (Signed)
Pt friend Sampson Goon called to check on patient, states that her phone must have died. Friend states that she can call her back at [phone].

## 2020-09-15 NOTE — ED Provider Notes (Signed)
[facility] EMERGENCY DEPARTMENT Provider Note   CSN: [id number] Arrival date & time: 09/14/20  1639     History Chief Complaint  Patient presents with   Loss of Consciousness    Melissa Kim is a 64 y.o. female.  Patient presents to the emergency department with a chief complaint of syncope.  Reportedly, patient was at the [facility] earlier today, was standing in line, got hot, and passed out.  She struck her left knee and ankle when getting "tangled up in her walker."  She states that she feels better now.  She does still complain of some residual left knee pain.  She denies hitting her head.  Denies being anticoagulated.  Denies having any chest pain or shortness of breath.  She states that she did feel dizzy prior to passing out.  The history is provided by the patient. No language interpreter was used.      Past Medical History:  Diagnosis Date   Asthma    Cancer ([place])    uterine   Complication of anesthesia    when Ether being used- almost did not wake up from Anesthesia   DIVERTICULITIS, HX OF 05/30/2008   Diverticulosis    DIVERTICULOSIS, COLON 05/30/2008   Family history of adverse reaction to anesthesia    sister -hard to keep asleep with anesthesia   Fx foot bone NEC-closed    GERD (gastroesophageal reflux disease)    Hypertension    Obesity    PONV (postoperative nausea and vomiting)    Psoriasis    STRESS FRACTURE, FOOT 12/05/2008   reports multiple   Venous stasis dermatitis     Patient Active Problem List   Diagnosis Date Noted   B12 deficiency 11/22/2019   Hyperlipidemia, unspecified 11/19/2018   Hypothyroidism, unspecified 11/19/2018   History of colonic polyps    Rectal bleeding    History of total hysterectomy 08/27/2017   Osteoarthritis, knee 03/28/2014   History of uterine cancer 03/28/2014   Hyperglycemia 03/28/2014   Vitamin D deficiency 11/20/2009   Overactive bladder 09/24/2009   Osteopenia 12/05/2008   Morbid obesity  ([place]) 11/30/2007   Essential hypertension 06/01/2007   Venous (peripheral) insufficiency 09/29/2006   Asthma 09/29/2006   GERD 09/29/2006   Psoriasis 09/29/2006    Past Surgical History:  Procedure Laterality Date   ABDOMINAL HYSTERECTOMY     complete reported including cervix. For uterine cancer. released by GYN.    APPENDECTOMY     arthroscopic of knee     CHOLECYSTECTOMY     COLONOSCOPY WITH PROPOFOL N/A 11/04/2018   Procedure: COLONOSCOPY WITH PROPOFOL;  Surgeon: Milus Banister, MD;  Location: [facility];  Service: Endoscopy;  Laterality: N/A;   FLEXIBLE SIGMOIDOSCOPY N/A 07/12/2020   Procedure: FLEXIBLE SIGMOIDOSCOPY;  Surgeon: Milus Banister, MD;  Location: [facility];  Service: Endoscopy;  Laterality: N/A;   TONSILLECTOMY       OB History   No obstetric history on file.     Family History  Problem Relation Age of Onset   Heart disease Father        MI in late 28s, early 68s, smoker   Hypertension Mother    Pancreatic cancer Mother    Stroke Maternal Grandmother    Heart attack Maternal Grandfather        thinks in 62s   Stroke Paternal Grandmother    Tuberculosis Paternal Grandmother    Heart attack Paternal Grandfather    Colon cancer Neg Hx  Esophageal cancer Neg Hx     Social History   Tobacco Use   Smoking status: Never   Smokeless tobacco: Never  Vaping Use   Vaping Use: Never used  Substance Use Topics   Alcohol use: No   Drug use: No    Home Medications Prior to Admission medications   Medication Sig Start Date End Date Taking? Authorizing Provider  acetaminophen (TYLENOL) 500 MG tablet Take 2,000 mg by mouth daily as needed for moderate pain or headache.     [provider]  albuterol (VENTOLIN HFA) 108 (90 Base) MCG/ACT inhaler Inhale 2 puffs into the lungs every 6 (six) hours as needed. For shortness of breath 09/30/19   Marin Olp, MD  Calcium Carb-Cholecalciferol (CALCIUM-VITAMIN D) 600-400 MG-UNIT TABS Take 1  tablet by mouth daily.    [provider]  diphenhydrAMINE (BENADRYL) 25 mg capsule Take 25 mg by mouth daily as needed for allergies.    [provider]  enalapril (VASOTEC) 5 MG tablet Take 1 tablet (5 mg total) by mouth daily. 09/30/19   Marin Olp, MD  hydrocortisone (ANUSOL-HC) 25 MG suppository Place 1 suppository (25 mg total) rectally daily. Place 1 suppository daily for 2 weeks, then PRN only. 07/12/20   Milus Banister, MD  levothyroxine (SYNTHROID) 50 MCG tablet Take 1 tablet by mouth once daily 07/17/20   Marin Olp, MD  Menthol, Topical Analgesic, (ICY HOT EX) Apply 1 application topically daily as needed (knee pain).    [provider]  Multiple Vitamins-Minerals (MULTIVITAMIN WITH MINERALS) tablet Take 1 tablet by mouth daily.    [provider]  Tetrahydrozoline HCl (REDNESS RELIEVER EYE DROPS OP) Place 1 drop into both eyes daily as needed (redness).    [provider]  vitamin B-12 (CYANOCOBALAMIN) 1000 MCG tablet Take 1,000 mcg by mouth daily.    [provider]    Allergies    Aspirin, Ibuprofen, Losartan potassium-hctz, Peg 3350-electrolytes, Strawberry extract, and Adhesive [tape]  Review of Systems   Review of Systems  All other systems reviewed and are negative.  Physical Exam Updated Vital Signs BP (!) 169/77 (BP Location: Left Arm)   Pulse 69   Temp 98.4 F (36.9 C) (Oral)   Resp (!) 24   SpO2 98%   Physical Exam Vitals and nursing note reviewed.  Constitutional:      General: She is not in acute distress.    Appearance: She is well-developed.  HENT:     Head: Normocephalic and atraumatic.  Eyes:     Conjunctiva/sclera: Conjunctivae normal.  Cardiovascular:     Rate and Rhythm: Normal rate and regular rhythm.     Heart sounds: No murmur heard. Pulmonary:     Effort: Pulmonary effort is normal. No respiratory distress.     Breath sounds: Normal breath sounds.  Abdominal:      Palpations: Abdomen is soft.     Tenderness: There is no abdominal tenderness.  Musculoskeletal:     Cervical back: Neck supple.  Skin:    General: Skin is warm and dry.  Neurological:     Mental Status: She is alert and oriented to person, place, and time.  Psychiatric:        Mood and Affect: Mood normal.        Behavior: Behavior normal.    ED Results / Procedures / Treatments   Labs (all labs ordered are listed, but only abnormal results are displayed) Labs Reviewed  BASIC METABOLIC  PANEL - Abnormal; Notable for the following components:      Result Value   Glucose, Bld 153 (*)    All other components within normal limits  CBC WITH DIFFERENTIAL/PLATELET  CBG MONITORING, ED    EKG EKG Interpretation  Date/Time:  Friday September 14 2020 16:37:17 EDT Ventricular Rate:  80 PR Interval:  162 QRS Duration: 126 QT Interval:  428 QTC Calculation: 493 R Axis:   54 Text Interpretation: Normal sinus rhythm Right bundle branch block Possible Lateral infarct , age undetermined Cannot rule out Inferior infarct , age undetermined Abnormal ECG PVC Confirmed by Quintella Reichert [phone]) on 09/15/2020 3:33:21 AM  Radiology DG Ankle Complete Left  Result Date: 09/14/2020 CLINICAL DATA:  Left ankle plain due to an injury suffered in a fall today. Initial encounter. EXAM: LEFT ANKLE COMPLETE - 3+ VIEW COMPARISON:  None. FINDINGS: There is no acute bony or joint abnormality. Calcaneal spurring and mild midfoot osteoarthritis are seen. Calcifications in subcutaneous fat of the lower leg are noted. IMPRESSION: No acute abnormality. Subcutaneous calcifications most consistent prior infectious or inflammatory process. Electronically Signed   By: Inge Rise M.D.   On: 09/14/2020 17:57   DG Ankle Complete Right  Result Date: 09/14/2020 CLINICAL DATA:  Fall EXAM: RIGHT ANKLE - COMPLETE 3+ VIEW COMPARISON:  None. FINDINGS: Plantar and posterior calcaneal spurs. Degenerative changes in the midfoot  and hindfoot. Ankle joint is maintained. No acute bony abnormality. Specifically, no fracture, subluxation, or dislocation. Extensive soft tissue calcifications in the right calf. IMPRESSION: No acute bony abnormality. Electronically Signed   By: Rolm Baptise M.D.   On: 09/14/2020 17:57    Procedures Procedures   Medications Ordered in ED Medications  acetaminophen (TYLENOL) tablet 650 mg (has no administration in time range)    ED Course  I have reviewed the triage vital signs and the nursing notes.  Pertinent labs & imaging results that were available during my care of the patient were reviewed by me and considered in my medical decision making (see chart for details).    MDM Rules/Calculators/A&P                           Patient here with syncopal episode earlier today.  She was at the [facility] standing in line, became hot, felt dizzy, and passed out.  I suspect this was likely positional orthostasis from standing too long.    Labs are reassuring.  HGB is normal.  Chemistries are normal.   X-rays are negative for acute injuries.  Will attempt ambulation and plan for discharge.  Seen by and discussed with Dr. Ralene Bathe, who agrees with plan for discharge and PCP follow-up.  Final Clinical Impression(s) / ED Diagnoses Final diagnoses:  Syncope, unspecified syncope type    Rx / DC Orders ED Discharge Orders     None        Montine Circle, PA-C 09/15/20 [place], April, MD 09/15/20 [phone]

## 2020-11-22 ENCOUNTER — Other Ambulatory Visit: Payer: Self-pay | Admitting: Family Medicine

## 2020-11-22 ENCOUNTER — Other Ambulatory Visit: Payer: Self-pay

## 2020-11-22 ENCOUNTER — Ambulatory Visit (INDEPENDENT_AMBULATORY_CARE_PROVIDER_SITE_OTHER): Payer: PPO | Admitting: Family Medicine

## 2020-11-22 ENCOUNTER — Encounter: Payer: Self-pay | Admitting: Family Medicine

## 2020-11-22 VITALS — BP 138/89 | HR 68 | Temp 97.9°F | Wt 272.6 lb

## 2020-11-22 DIAGNOSIS — Z23 Encounter for immunization: Secondary | ICD-10-CM | POA: Diagnosis not present

## 2020-11-22 DIAGNOSIS — E538 Deficiency of other specified B group vitamins: Secondary | ICD-10-CM | POA: Diagnosis not present

## 2020-11-22 DIAGNOSIS — I10 Essential (primary) hypertension: Secondary | ICD-10-CM

## 2020-11-22 DIAGNOSIS — R739 Hyperglycemia, unspecified: Secondary | ICD-10-CM

## 2020-11-22 DIAGNOSIS — E785 Hyperlipidemia, unspecified: Secondary | ICD-10-CM

## 2020-11-22 DIAGNOSIS — E039 Hypothyroidism, unspecified: Secondary | ICD-10-CM | POA: Diagnosis not present

## 2020-11-22 DIAGNOSIS — E559 Vitamin D deficiency, unspecified: Secondary | ICD-10-CM | POA: Diagnosis not present

## 2020-11-22 DIAGNOSIS — Z0000 Encounter for general adult medical examination without abnormal findings: Secondary | ICD-10-CM | POA: Diagnosis not present

## 2020-11-22 LAB — COMPREHENSIVE METABOLIC PANEL
ALT: 19 U/L (ref 0–35)
AST: 16 U/L (ref 0–37)
Albumin: 4 g/dL (ref 3.5–5.2)
Alkaline Phosphatase: 81 U/L (ref 39–117)
BUN: 14 mg/dL (ref 6–23)
CO2: 28 mEq/L (ref 19–32)
Calcium: 9.2 mg/dL (ref 8.4–10.5)
Chloride: 103 mEq/L (ref 96–112)
Creatinine, Ser: 0.71 mg/dL (ref 0.40–1.20)
GFR: 88.09 mL/min (ref >60.00)
Glucose, Bld: 85 mg/dL (ref 70–99)
Potassium: 4 mEq/L (ref 3.5–5.1)
Sodium: 139 mEq/L (ref 135–145)
Total Bilirubin: 0.4 mg/dL (ref 0.2–1.2)
Total Protein: 7 g/dL (ref 6.0–8.3)

## 2020-11-22 LAB — VITAMIN D 25 HYDROXY (VIT D DEFICIENCY, FRACTURES): VITD: 24.11 ng/mL — ABNORMAL LOW (ref 30.00–100.00)

## 2020-11-22 LAB — CBC WITH DIFFERENTIAL/PLATELET
Basophils Absolute: 0.1 10*3/uL (ref 0.0–0.1)
Basophils Relative: 0.9 % (ref 0.0–3.0)
Eosinophils Absolute: 0.2 10*3/uL (ref 0.0–0.7)
Eosinophils Relative: 2.7 % (ref 0.0–5.0)
HCT: 41.5 % (ref 36.0–46.0)
Hemoglobin: 13.5 g/dL (ref 12.0–15.0)
Lymphocytes Relative: 28.9 % (ref 12.0–46.0)
Lymphs Abs: 1.7 10*3/uL (ref 0.7–4.0)
MCHC: 32.5 g/dL (ref 30.0–36.0)
MCV: 91.6 fl (ref 78.0–100.0)
Monocytes Absolute: 0.5 10*3/uL (ref 0.1–1.0)
Monocytes Relative: 7.7 % (ref 3.0–12.0)
Neutro Abs: 3.5 10*3/uL (ref 1.4–7.7)
Neutrophils Relative %: 59.8 % (ref 43.0–77.0)
Platelets: 203 10*3/uL (ref 150.0–400.0)
RBC: 4.53 Mil/uL (ref 3.87–5.11)
RDW: 14 % (ref 11.5–15.5)
WBC: 5.9 10*3/uL (ref 4.0–10.5)

## 2020-11-22 LAB — VITAMIN B12: Vitamin B-12: 210 pg/mL — ABNORMAL LOW (ref 211–911)

## 2020-11-22 LAB — LIPID PANEL
Cholesterol: 193 mg/dL (ref 0–200)
HDL: 60.8 mg/dL (ref >39.00)
LDL Cholesterol: 99 mg/dL (ref 0–99)
NonHDL: 131.7
Total CHOL/HDL Ratio: 3
Triglycerides: 164 mg/dL — ABNORMAL HIGH (ref 0.0–149.0)
VLDL: 32.8 mg/dL (ref 0.0–40.0)

## 2020-11-22 LAB — HEMOGLOBIN A1C: Hgb A1c MFr Bld: 5.3 % (ref 4.6–6.5)

## 2020-11-22 LAB — TSH: TSH: 3.48 u[IU]/mL (ref 0.35–5.50)

## 2020-11-22 MED ORDER — ENALAPRIL MALEATE 5 MG PO TABS: 5.0000 mg | ORAL_TABLET | Freq: Every day | ORAL | 3 refills | Status: DC

## 2020-11-22 MED ORDER — VITAMIN D (ERGOCALCIFEROL) 1.25 MG (50000 UNIT) PO CAPS: 50000.0000 [IU] | ORAL_CAPSULE | ORAL | 0 refills | Status: DC

## 2020-11-22 MED ORDER — LEVOTHYROXINE SODIUM 50 MCG PO TABS
50.0000 ug | ORAL_TABLET | Freq: Every day | ORAL | 3 refills | Status: DC
Start: 2020-11-22 — End: 2021-05-23

## 2020-11-22 NOTE — Patient Instructions (Addendum)
Health Maintenance Due  Topic Date Due   Zoster Vaccines- Shingrix (1 of 2) - Please check with your pharmacy to see if they have the shingrix vaccine. If they do- please get this immunization and update [place] by phone call or mychart with dates you receive the vaccine  Never done   DEXA SCAN  - Please let us know when your weight gets under 250 so you can have this done.  Never done   COVID-19 Vaccine (4 - Booster for [employer] series) - Recommended getting Omicron specific booster only at your local pharmacy! Please let us know when you have received this vaccination.  06/14/2020   INFLUENZA VACCINE  - High dose flu shot 09/17/2020   Please stop by lab before you go If you have mychart- we will send your results within 3 business days of [place] receiving them.  If you do not have mychart- we will call you about results within 5 business days of [place] receiving them.  *please also note that you will see labs on mychart as soon as they post. I will later go in and write notes on them- will say "notes from Dr. Yong Channel"  If you have any more issues with recurrent issues with syncope/passing out, pleas let me know so that we can consider some more exhaustive work-ups.  Congratulations on losing 10 lbs from your last visit- keep up the good work!  For your blood pressure, we will not increase any of your medications today. Please continue to monitor at home as well to avoid orthostasis.  I strongly encourage you schedule a dental follow-up.  I encourage that you try to perform some chair exercises.  Sign release of information at the check out desk for last mammogram from [place].  Recommended follow up: Return in about 6 months (around 05/23/2021) for a follow-up or sooner if needed.

## 2020-11-22 NOTE — Progress Notes (Signed)
Phone [phone]   Subjective:  Patient presents today for their annual physical. Chief complaint-noted.   See problem oriented charting- Review of Systems  Constitutional:  Negative for chills and fever.  HENT:  Positive for tinnitus (stable). Negative for hearing loss.   Eyes:  Negative for blurred vision and double vision.  Respiratory:  Positive for cough (with asthma), shortness of breath (sometimes with asthma) and wheezing (occasionally).   Cardiovascular:  Negative for chest pain and palpitations.  Gastrointestinal:  Negative for blood in stool, heartburn, melena, nausea and vomiting.  Genitourinary:  Positive for frequency (stable). Negative for dysuria.  Musculoskeletal:  Positive for back pain and joint pain. Negative for neck pain.  Skin:  Positive for itching (itchy back). Negative for rash.  Neurological:  Negative for dizziness and headaches.  Endo/Heme/Allergies:  Negative for polydipsia. Bruises/bleeds easily (states always bruised easily).  Psychiatric/Behavioral:  Negative for depression and suicidal ideas.    The following were reviewed and entered/updated in epic: Past Medical History:  Diagnosis Date   Asthma    Cancer ([place])    uterine   Complication of anesthesia    when Ether being used- almost did not wake up from Anesthesia   DIVERTICULITIS, HX OF 05/30/2008   Diverticulosis    DIVERTICULOSIS, COLON 05/30/2008   Family history of adverse reaction to anesthesia    sister -hard to keep asleep with anesthesia   Fx foot bone NEC-closed    GERD (gastroesophageal reflux disease)    Hypertension    Obesity    PONV (postoperative nausea and vomiting)    Psoriasis    STRESS FRACTURE, FOOT 12/05/2008   reports multiple   Venous stasis dermatitis    Patient Active Problem List   Diagnosis Date Noted   Morbid obesity ([place]) 11/30/2007    Priority: 1.   Hyperlipidemia, unspecified 11/19/2018    Priority: 2.   Hypothyroidism, unspecified 11/19/2018     Priority: 2.   History of uterine cancer 03/28/2014    Priority: 2.   Hyperglycemia 03/28/2014    Priority: 2.   Overactive bladder 09/24/2009    Priority: 2.   Osteopenia 12/05/2008    Priority: 2.   Essential hypertension 06/01/2007    Priority: 2.   Asthma 09/29/2006    Priority: 2.   Psoriasis 09/29/2006    Priority: 2.   B12 deficiency 11/22/2019    Priority: 3.   History of total hysterectomy 08/27/2017    Priority: 3.   Osteoarthritis, knee 03/28/2014    Priority: 3.   Vitamin D deficiency 11/20/2009    Priority: 3.   Venous (peripheral) insufficiency 09/29/2006    Priority: 3.   GERD 09/29/2006    Priority: 3.   History of colonic polyps    Rectal bleeding    Past Surgical History:  Procedure Laterality Date   ABDOMINAL HYSTERECTOMY     complete reported including cervix. For uterine cancer. released by GYN.    APPENDECTOMY     arthroscopic of knee     CHOLECYSTECTOMY     COLONOSCOPY WITH PROPOFOL N/A 11/04/2018   Procedure: COLONOSCOPY WITH PROPOFOL;  Surgeon: Milus Banister, MD;  [facility];  Service: Endoscopy;  Laterality: N/A;   FLEXIBLE SIGMOIDOSCOPY N/A 07/12/2020   Procedure: FLEXIBLE SIGMOIDOSCOPY;  Surgeon: Milus Banister, MD;  Location[facility];  Service: Endoscopy;  Laterality: N/A;   TONSILLECTOMY      Family History  Problem Relation Age of Onset   Heart disease Father  MI in late 36s, early 39s, smoker   Hypertension Mother    Pancreatic cancer Mother    Stroke Maternal Grandmother    Heart attack Maternal Grandfather        thinks in 1s   Stroke Paternal Grandmother    Tuberculosis Paternal Grandmother    Heart attack Paternal Grandfather    Colon cancer Neg Hx    Esophageal cancer Neg Hx     Medications- reviewed and updated Current Outpatient Medications  Medication Sig Dispense Refill   acetaminophen (TYLENOL) 500 MG tablet Take 2,000 mg by mouth daily as needed for moderate pain or headache.       albuterol (VENTOLIN HFA) 108 (90 Base) MCG/ACT inhaler Inhale 2 puffs into the lungs every 6 (six) hours as needed. For shortness of breath 18 g 5   Calcium Carb-Cholecalciferol (CALCIUM-VITAMIN D) 600-400 MG-UNIT TABS Take 1 tablet by mouth daily.     diphenhydrAMINE (BENADRYL) 25 mg capsule Take 25 mg by mouth daily as needed for allergies.     enalapril (VASOTEC) 5 MG tablet Take 1 tablet (5 mg total) by mouth daily. 90 tablet 3   hydrocortisone (ANUSOL-HC) 25 MG suppository Place 1 suppository (25 mg total) rectally daily. Place 1 suppository daily for 2 weeks, then PRN only. 30 suppository 6   levothyroxine (SYNTHROID) 50 MCG tablet Take 1 tablet by mouth once daily 90 tablet 0   Menthol, Topical Analgesic, (ICY HOT EX) Apply 1 application topically daily as needed (knee pain).     Multiple Vitamins-Minerals (MULTIVITAMIN WITH MINERALS) tablet Take 1 tablet by mouth daily.     Tetrahydrozoline HCl (REDNESS RELIEVER EYE DROPS OP) Place 1 drop into both eyes daily as needed (redness).     vitamin B-12 (CYANOCOBALAMIN) 1000 MCG tablet Take 1,000 mcg by mouth daily.     No current facility-administered medications for this visit.    Allergies-reviewed and updated Allergies  Allergen Reactions   Aspirin     Gi upset   Ibuprofen     GI upset   Losartan Potassium-Hctz     itching   Peg 3350-Electrolytes Nausea And Vomiting    Vomiting (golytely)   Strawberry Extract Hives and Itching   Adhesive [Tape] Rash    Social History   Social History Narrative   Married (patient of Dr. 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 Oropharynx normal other than very poor dentition Neck: no thyromegaly CV:  RRR no murmurs rubs or gallops Lungs: CTAB no crackles, wheeze, rhonchi Abdomen: soft/nontender/nondistended/normal bowel sounds. No rebound or guarding. Morbid obesity Ext: trace to 1+ edema Skin: warm, dry Neuro: grossly normal, moves all extremities, PERRLA   Assessment and Plan   65 y.o. female presenting for annual physical.  Health Maintenance counseling: 1. Anticipatory guidance: Patient counseled regarding regular dental exams -q6 months- strongly encouraged dental follow up- poor dentition, eye exams - encouraged to follow-up last year- she states saw last month,  avoiding smoking and second hand smoke , limiting alcohol to 1 beverage per day -does not drink , no illicit drugs.   2. Risk factor reduction:  Advised patient of need for regular exercise and diet rich and fruits and vegetables to reduce risk of heart attack and stroke. Exercise- limited due to pain last year and encouraged chair exercises- as not started yet. Diet-patient was working hard on dietary changes and morbid obesity last year - great job on weight loss- trying to get under250 so she can get bone density Wt Readings from Last 3 Encounters:  11/22/20 272 lb 9.6 oz (123.7 kg)  07/12/20 282 lb 3 oz (128 kg)  05/22/20 282 lb 9.6 oz (128.2 kg)   3. Immunizations/screenings/ancillary studies - discussed [place] rec, Bivalent booster-at phamracy rec, and Flu shot-high dose today - otherwise up-to-date. Immunization History  Administered Date(s) Administered   Fluad Quad(high Dose 65+) 11/19/2018, 11/22/2020   Influenza Split 12/30/2010, 11/25/2011   Influenza Whole 11/17/2008, 11/20/2009   Influenza, High Dose Seasonal PF 11/22/2019   Influenza,inj,Quad PF,6+ Mos 11/02/2012, 04/03/2015, 03/13/2016, 10/29/2017   Influenza-Unspecified 11/22/2019   PFIZER(Purple Top)SARS-COV-2 Vaccination 03/31/2019, 04/25/2019, 02/14/2020   Pneumococcal Polysaccharide-23 01/06/2019   Td 02/17/2001   Tdap 08/09/2012     4. Cervical cancer screening- past age based screening recs- no blood or discharge. History of uterine cancer- had removal including cervix- was told no further pap smears. Ovaries also removed.  5. Breast cancer screening-  mammogram last done 12/17/2018 on file with a 2-year repeat planned - she had last year as well 2021- will get records 6. Colon cancer screening - last done 11/04/2018 wth a 10-year repeat planned 7. Skin cancer screening- saw derm for cyst in scalp- was not cancerous- no regular screening planned. advised regular sunscreen use. Denies worrisome, changing, or new skin lesions.  8. Birth control/STD check- monogamous and hysterectomy 9. Osteoporosis screening at 67- no record on file - patient had to get under 250 to have this done 10. Smoking associated screening - never smoker  Status of chronic or acute concerns   #rectal bleeding- Patient was seen by GI on 07/12/2020 for rectal bleeding. She also had a flexible sigmoidoscopy during the encounter as well  Dr. Ardis Hughs prescribed Anusol-HC 25 mg suppository and to use rectally for 2 weeks then after PRN only. hemorrhoid related  # ED F/Us for Syncope-  S: Patient was later seen in the ED on 09/14/2020 for syncope - passed out at the [facility] prior to visit and struck her left knee and ankle.  Dr. Ralene Bathe suspected positional orthostasis due to prolong standing. Labs were reassuring - HGB was normal and chemistries were normal.  X-rays were negative - no acute abnormalities noted - X-ray of the left ankle did note subcutaneous calcifications most consistent with prior infectious or inflammatory process.  Ambulation was attempted. Patient was seen by and plans were discussed with Dr. Ralene Bathe - she agreed with plan for discharge and PCP follow-up.   Today patient reports no further issues with feeling presyncopal or having syncope. With home health nurse was told had intermittent skp in heart. On ekg on 09/14/20- appears has rare  PVCs A/P: Syncope was thought to be related to positional orthostasis in heat in crowded line  from prolonged standing-thankfully no significant injuries and she recovered well from this-we discussed if she has recurrent issues we will need to do more exhaustive work-up  #hyperlipidemia S: Medication: None -has wanted to work on weight loss- down another 10 lbs today Lab Results  Component Value Date   CHOL 204 (H) 11/22/2019   HDL 67 11/22/2019   LDLCALC 113 (H) 11/22/2019  LDLDIRECT 118.0 04/03/2015   TRIG 125 11/22/2019   CHOLHDL 3.0 11/22/2019   A/P: hopefully improved with weight loss- update lipids today  # Hyperglycemia/insulin resistance/prediabetes S:  Medication: None Exercise and diet- see above Lab Results  Component Value Date   HGBA1C 5.5 09/30/2019   HGBA1C 5.8 11/19/2018   HGBA1C 5.8 08/19/2018    A/P: hopefully remains controlled- check yearly- thrilled about weight loss  #hypothyroidism S: compliant On thyroid medication- levothyroxine 50 mcg daily- has been out for a few weeks and reports did not have finances to get it- working on this Lab Results  Component Value Date   TSH 3.07 11/22/2019   A/P:hopefully stable- update tsh today. Continue current meds for now- #s could be off as has been out of medicine   #hypertension S: medication: Enalapril 5 mg - considered 5 mg in the AM and 2.5 mg before bed- had syncope issues with low BP with 1 in Am and 1 in PM in past she reports Home readings #s: this Am 138/89 BP Readings from Last 3 Encounters:  11/22/20 138/89  09/15/20 (!) 149/98  07/12/20 (!) 145/55  A/P: Blood pressure reasonably well controlled at home-we will continue current medications especially since we want to avoid orthostasis.  # B12 deficiency S: Current treatment/medication (oral vs. IM): 1000 mcg daily Lab Results  Component Value Date   VITAMINB12 210 11/22/2019   A/P: hopefully stable- update b12 today. Continue current meds for  now   #Vitamin D deficiency S: Medication: Calcium-Vitamin D 600-400 mg-units daily- 2 per day Last vitamin D Lab Results  Component Value Date   VD25OH 23 (L) 11/22/2019   A/P: hopefully stable- update vit D today. Continue current meds for now   Recommended follow up: No follow-ups on file. Future Appointments  Date Time Provider [place]  02/07/2021  8:45 AM [facility] COACH [facility]   Lab/Order associations: fasting   ICD-10-CM   1. Preventative health care  Z00.00     2. Hyperlipidemia, unspecified hyperlipidemia type  E78.5 CBC with Differential/Platelet    Comprehensive metabolic panel    Lipid panel    3. Hypothyroidism, unspecified type  E03.9 TSH    4. Essential hypertension  I10     5. Hyperglycemia  R73.9 Hemoglobin A1c    6. Vitamin D deficiency  E55.9 Vitamin D (25 hydroxy)    7. B12 deficiency  E53.8 B12    8. Need for immunization against influenza  Z23 Flu Vaccine QUAD High Dose(Fluad)      No orders of the defined types were placed in this encounter.  I,Harris Phan,acting as a Education administrator for Garret Reddish, MD.,have documented all relevant documentation on the behalf of Garret Reddish, MD,as directed by  Garret Reddish, MD while in the presence of Garret Reddish, MD.  I, Garret Reddish, MD, have reviewed all documentation for this visit. The documentation on 11/22/20 for the exam, diagnosis, procedures, and orders are all accurate and complete.   Return precautions advised.  Garret Reddish, MD

## 2020-11-29 ENCOUNTER — Encounter: Payer: Self-pay | Admitting: Family Medicine

## 2021-01-21 ENCOUNTER — Telehealth: Payer: Self-pay | Admitting: Family Medicine

## 2021-01-21 NOTE — Telephone Encounter (Signed)
Copied from [place] [id number]. Topic: Medicare AWV >> Jan 21, 2021 11:34 AM Harris-Coley, Hannah Beat wrote: Reason for CRM: LVM 01/21/21 @11 :32am to r/s AWV appt 02/07/21 [facility]

## 2021-01-24 DIAGNOSIS — Z1231 Encounter for screening mammogram for malignant neoplasm of breast: Secondary | ICD-10-CM | POA: Diagnosis not present

## 2021-01-24 LAB — HM MAMMOGRAPHY

## 2021-01-29 ENCOUNTER — Telehealth: Payer: Self-pay | Admitting: Family Medicine

## 2021-01-29 NOTE — Telephone Encounter (Signed)
Spoke with patient and she declined Awv due to she stated the [facility] came to he house and did AWV khc

## 2021-02-05 ENCOUNTER — Ambulatory Visit: Payer: PPO

## 2021-02-07 ENCOUNTER — Ambulatory Visit: Payer: PPO

## 2021-04-24 NOTE — Progress Notes (Incomplete)
Phone [phone] In person visit   Subjective:   Melissa Kim is a 66 y.o. year old very pleasant female patient who presents for/with See problem oriented charting No chief complaint on file.   This visit occurred during the SARS-CoV-2 public health emergency.  Safety protocols were in place, including screening questions prior to the visit, additional usage of staff PPE, and extensive cleaning of exam room while observing appropriate contact time as indicated for disinfecting solutions.   Past Medical History-  Patient Active Problem List   Diagnosis Date Noted   B12 deficiency 11/22/2019   Hyperlipidemia, unspecified 11/19/2018   Hypothyroidism, unspecified 11/19/2018   History of colonic polyps    Rectal bleeding    History of total hysterectomy 08/27/2017   Osteoarthritis, knee 03/28/2014   History of uterine cancer 03/28/2014   Hyperglycemia 03/28/2014   Vitamin D deficiency 11/20/2009   Overactive bladder 09/24/2009   Osteopenia 12/05/2008   Morbid obesity ([place]) 11/30/2007   Essential hypertension 06/01/2007   Venous (peripheral) insufficiency 09/29/2006   Asthma 09/29/2006   GERD 09/29/2006   Psoriasis 09/29/2006    Medications- reviewed and updated Current Outpatient Medications  Medication Sig Dispense Refill   acetaminophen (TYLENOL) 500 MG tablet Take 2,000 mg by mouth daily as needed for moderate pain or headache.      albuterol (VENTOLIN HFA) 108 (90 Base) MCG/ACT inhaler Inhale 2 puffs into the lungs every 6 (six) hours as needed. For shortness of breath 18 g 5   Calcium Carb-Cholecalciferol (CALCIUM-VITAMIN D) 600-400 MG-UNIT TABS Take 1 tablet by mouth daily.     diphenhydrAMINE (BENADRYL) 25 mg capsule Take 25 mg by mouth daily as needed for allergies.     enalapril (VASOTEC) 5 MG tablet Take 1 tablet (5 mg total) by mouth daily. 90 tablet 3   hydrocortisone (ANUSOL-HC) 25 MG suppository Place 1 suppository (25 mg total) rectally daily. Place 1  suppository daily for 2 weeks, then PRN only. 30 suppository 6   levothyroxine (SYNTHROID) 50 MCG tablet Take 1 tablet (50 mcg total) by mouth daily. 90 tablet 3   Menthol, Topical Analgesic, (ICY HOT EX) Apply 1 application topically daily as needed (knee pain).     Multiple Vitamins-Minerals (MULTIVITAMIN WITH MINERALS) tablet Take 1 tablet by mouth daily.     Tetrahydrozoline HCl (REDNESS RELIEVER EYE DROPS OP) Place 1 drop into both eyes daily as needed (redness).     vitamin B-12 (CYANOCOBALAMIN) 1000 MCG tablet Take 1,000 mcg by mouth daily.     Vitamin D, Ergocalciferol, (DRISDOL) 1.25 MG (50000 UNIT) CAPS capsule Take 1 capsule (50,000 Units total) by mouth every 7 (seven) days. 12 capsule 0   No current facility-administered medications for this visit.     Objective:  There were no vitals taken for this visit. Gen: NAD, resting comfortably CV: RRR no murmurs rubs or gallops Lungs: CTAB no crackles, wheeze, rhonchi Abdomen: soft/nontender/nondistended/normal bowel sounds. No rebound or guarding.  Ext: no edema Skin: warm, dry Neuro: grossly normal, moves all extremities  ***    Assessment and Plan   awv 02/12/20 ***  cpe 11/22/2019 ***  ***Long-term B12 injections-on B12 1000 mcg daily B12 #s still went low in 2022   #rectal bleeding- Patient was seen by GI on 07/12/2020 for rectal bleeding. She also had a flexible sigmoidoscopy during the encounter as well   Dr. Ardis Hughs prescribed Anusol-HC 25 mg suppository and to use rectally for 2 weeks then after PRN only. hemorrhoid related   #  ED F/Us for Syncope-  S: Patient was later seen in the ED on 09/14/2020 for syncope - passed out at [facility] prior to visit and struck her left knee and ankle.   Dr. Ralene Bathe suspected positional orthostasis due to prolong standing. Labs were reassuring - HGB was normal and chemistries were normal.  X-rays were negative - no acute abnormalities noted - X-ray of the left ankle did note subcutaneous  calcifications most consistent with prior infectious or inflammatory process.   Ambulation was attempted. Patient was seen by and planned were discussed with Dr. Ralene Bathe - she agreed with plan for discharge and PCP follow-up.   11/2020 visit, patient reported no further issues with feeling presyncopal or having syncope. With home health nurse was told had intermittent skp in heart. On ekg on 09/14/20- appears has rare PVCs -we discussed if she had recurrent issues we will need to do more exhaustive work-up A/P: ***   #hyperlipidemia S: Medication: None -has wanted to work on weight loss- down another 10 lbs today Lab Results  Component Value Date   CHOL 193 11/22/2020   HDL 60.80 11/22/2020   LDLCALC 99 11/22/2020   LDLDIRECT 118.0 04/03/2015   TRIG 164.0 (H) 11/22/2020   CHOLHDL 3 11/22/2020   A/P: ***   # Hyperglycemia/insulin resistance/prediabetes S:  Medication: None Exercise and diet- *** Lab Results  Component Value Date   HGBA1C 5.3 11/22/2020   HGBA1C 5.5 09/30/2019   HGBA1C 5.8 11/19/2018    A/P: ***   #hypothyroidism S: compliant On thyroid medication- levothyroxine 50 mcg daily- had been out for a few weeks and reported did not have finances to get it- worked on this Lab Results  Component Value Date   TSH 3.48 11/22/2020    A/P:***   #hypertension S: medication: Enalapril 5 mg - considered 5 mg in the AM and 2.5 mg before bed- had syncope issues with low BP with 1 in Am and 1 in PM in past she reports Home readings #s: *** BP Readings from Last 3 Encounters:  11/22/20 138/89  09/15/20 (!) 149/98  07/12/20 (!) 145/55  A/P: ***   # B12 deficiency S: Current treatment/medication (oral vs. IM): 1000 mcg daily Lab Results  Component Value Date   VITAMINB12 210 (L) 11/22/2020   A/P: ***    #Vitamin D deficiency S: Medication: Calcium-Vitamin D 600-400 mg-units daily- 2 per day Last vitamin D Lab Results  Component Value Date   VD25OH 24.11 (L)  11/22/2020   A/P: ***     Health Maintenance Due  Topic Date Due   Zoster Vaccines- Shingrix (1 of 2) Never done   DEXA SCAN  Never done   Pneumonia Vaccine 62+ Years old (2 - PCV) 01/06/2020   COVID-19 Vaccine (4 - Booster for [place] series) 04/10/2020   Recommended follow up: No follow-ups on file. Future Appointments  Date Time Provider [place]  05/23/2021 11:20 AM Marin Olp, MD [facility]    Lab/Order associations: No diagnosis found.  No orders of the defined types were placed in this encounter.   I,Jada Bradford,acting as a scribe for Garret Reddish, MD.,have documented all relevant documentation on the behalf of Garret Reddish, MD,as directed by  Garret Reddish, MD while in the presence of Garret Reddish, MD.  *** Return precautions advised.  Burnett Corrente

## 2021-05-23 ENCOUNTER — Encounter: Payer: Self-pay | Admitting: Family Medicine

## 2021-05-23 ENCOUNTER — Ambulatory Visit (INDEPENDENT_AMBULATORY_CARE_PROVIDER_SITE_OTHER): Payer: PPO | Admitting: Family Medicine

## 2021-05-23 VITALS — BP 140/64 | HR 66 | Temp 98.3°F | Ht <= 58 in | Wt 289.8 lb

## 2021-05-23 DIAGNOSIS — R739 Hyperglycemia, unspecified: Secondary | ICD-10-CM

## 2021-05-23 DIAGNOSIS — I10 Essential (primary) hypertension: Secondary | ICD-10-CM

## 2021-05-23 DIAGNOSIS — E039 Hypothyroidism, unspecified: Secondary | ICD-10-CM | POA: Diagnosis not present

## 2021-05-23 DIAGNOSIS — E785 Hyperlipidemia, unspecified: Secondary | ICD-10-CM | POA: Diagnosis not present

## 2021-05-23 MED ORDER — LEVOTHYROXINE SODIUM 50 MCG PO TABS: 50.0000 ug | ORAL_TABLET | Freq: Every day | ORAL | 3 refills | Status: DC

## 2021-05-23 MED ORDER — ENALAPRIL MALEATE 5 MG PO TABS: 5.0000 mg | ORAL_TABLET | Freq: Every day | ORAL | 3 refills | Status: DC

## 2021-05-23 NOTE — Patient Instructions (Addendum)
We opted out of labs today to help with cost savings ? ?Please restart enalapril and levothyroxine- blood pressure and weight are up off of these- suspect both will improve back on and we can recheck at physical ? ?Recommended follow up: Return in about 6 months (around 11/23/2021) for physical or sooner if needed.Schedule b4 you leave.  ?

## 2021-11-26 ENCOUNTER — Ambulatory Visit (INDEPENDENT_AMBULATORY_CARE_PROVIDER_SITE_OTHER): Payer: PPO | Admitting: Family Medicine

## 2021-11-26 ENCOUNTER — Encounter: Payer: Self-pay | Admitting: Family Medicine

## 2021-11-26 VITALS — BP 130/78 | HR 70 | Temp 98.0°F | Ht <= 58 in | Wt 281.2 lb

## 2021-11-26 DIAGNOSIS — R739 Hyperglycemia, unspecified: Secondary | ICD-10-CM | POA: Diagnosis not present

## 2021-11-26 DIAGNOSIS — Z0000 Encounter for general adult medical examination without abnormal findings: Secondary | ICD-10-CM

## 2021-11-26 DIAGNOSIS — E538 Deficiency of other specified B group vitamins: Secondary | ICD-10-CM

## 2021-11-26 DIAGNOSIS — Z23 Encounter for immunization: Secondary | ICD-10-CM

## 2021-11-26 DIAGNOSIS — E559 Vitamin D deficiency, unspecified: Secondary | ICD-10-CM | POA: Diagnosis not present

## 2021-11-26 DIAGNOSIS — E039 Hypothyroidism, unspecified: Secondary | ICD-10-CM

## 2021-11-26 DIAGNOSIS — E785 Hyperlipidemia, unspecified: Secondary | ICD-10-CM | POA: Diagnosis not present

## 2021-11-26 LAB — COMPREHENSIVE METABOLIC PANEL
ALT: 17 U/L (ref 0–35)
AST: 15 U/L (ref 0–37)
Albumin: 4 g/dL (ref 3.5–5.2)
Alkaline Phosphatase: 81 U/L (ref 39–117)
BUN: 15 mg/dL (ref 6–23)
CO2: 28 mEq/L (ref 19–32)
Calcium: 9.2 mg/dL (ref 8.4–10.5)
Chloride: 103 mEq/L (ref 96–112)
Creatinine, Ser: 0.58 mg/dL (ref 0.40–1.20)
GFR: 93.09 mL/min (ref >60.00)
Glucose, Bld: 102 mg/dL — ABNORMAL HIGH (ref 70–99)
Potassium: 4.3 mEq/L (ref 3.5–5.1)
Sodium: 144 mEq/L (ref 135–145)
Total Bilirubin: 0.4 mg/dL (ref 0.2–1.2)
Total Protein: 6.8 g/dL (ref 6.0–8.3)

## 2021-11-26 LAB — CBC WITH DIFFERENTIAL/PLATELET
Basophils Absolute: 0.1 10*3/uL (ref 0.0–0.1)
Basophils Relative: 1.1 % (ref 0.0–3.0)
Eosinophils Absolute: 0.2 10*3/uL (ref 0.0–0.7)
Eosinophils Relative: 2.5 % (ref 0.0–5.0)
HCT: 43.9 % (ref 36.0–46.0)
Hemoglobin: 14.6 g/dL (ref 12.0–15.0)
Lymphocytes Relative: 24.3 % (ref 12.0–46.0)
Lymphs Abs: 2.1 10*3/uL (ref 0.7–4.0)
MCHC: 33.3 g/dL (ref 30.0–36.0)
MCV: 94.1 fl (ref 78.0–100.0)
Monocytes Absolute: 0.6 10*3/uL (ref 0.1–1.0)
Monocytes Relative: 7 % (ref 3.0–12.0)
Neutro Abs: 5.7 10*3/uL (ref 1.4–7.7)
Neutrophils Relative %: 65.1 % (ref 43.0–77.0)
Platelets: 160 10*3/uL (ref 150.0–400.0)
RBC: 4.66 Mil/uL (ref 3.87–5.11)
RDW: 13.6 % (ref 11.5–15.5)
WBC: 8.7 10*3/uL (ref 4.0–10.5)

## 2021-11-26 LAB — LIPID PANEL
Cholesterol: 212 mg/dL — ABNORMAL HIGH (ref 0–200)
HDL: 67.4 mg/dL (ref >39.00)
LDL Cholesterol: 117 mg/dL — ABNORMAL HIGH (ref 0–99)
NonHDL: 144.43
Total CHOL/HDL Ratio: 3
Triglycerides: 137 mg/dL (ref 0.0–149.0)
VLDL: 27.4 mg/dL (ref 0.0–40.0)

## 2021-11-26 LAB — HEMOGLOBIN A1C: Hgb A1c MFr Bld: 5.7 % (ref 4.6–6.5)

## 2021-11-26 NOTE — Progress Notes (Signed)
Phone [phone]   Subjective:  Patient presents today for their annual physical. Chief complaint-noted.   See problem oriented charting- ROS- full  review of systems was completed and negative except for: vertigo. Joint pain  The following were reviewed and entered/updated in epic: Past Medical History:  Diagnosis Date   Asthma    Cancer ([place])    uterine   Complication of anesthesia    when Ether being used- almost did not wake up from Anesthesia   DIVERTICULITIS, HX OF 05/30/2008   Diverticulosis    DIVERTICULOSIS, COLON 05/30/2008   Family history of adverse reaction to anesthesia    sister -hard to keep asleep with anesthesia   Fx foot bone NEC-closed    GERD (gastroesophageal reflux disease)    Hypertension    Obesity    PONV (postoperative nausea and vomiting)    Psoriasis    STRESS FRACTURE, FOOT 12/05/2008   reports multiple   Venous stasis dermatitis    Patient Active Problem List   Diagnosis Date Noted   Morbid obesity ([place]) 11/30/2007    Priority: High   Hyperlipidemia, unspecified 11/19/2018    Priority: Medium    Hypothyroidism, unspecified 11/19/2018    Priority: Medium    History of uterine cancer 03/28/2014    Priority: Medium    Hyperglycemia 03/28/2014    Priority: Medium    Overactive bladder 09/24/2009    Priority: Medium    Osteopenia 12/05/2008    Priority: Medium    Essential hypertension 06/01/2007    Priority: Medium    Asthma 09/29/2006    Priority: Medium    Psoriasis 09/29/2006    Priority: Medium    B12 deficiency 11/22/2019    Priority: Low   History of total hysterectomy 08/27/2017    Priority: Low   Osteoarthritis, knee 03/28/2014    Priority: Low   Vitamin D deficiency 11/20/2009    Priority: Low   Venous (peripheral) insufficiency 09/29/2006    Priority: Low   GERD 09/29/2006    Priority: Low   History of colonic polyps    Rectal bleeding    Past Surgical History:  Procedure Laterality Date   ABDOMINAL  HYSTERECTOMY     complete reported including cervix. For uterine cancer. released by GYN.    APPENDECTOMY     arthroscopic of knee     CHOLECYSTECTOMY     COLONOSCOPY WITH PROPOFOL N/A 11/04/2018   Procedure: COLONOSCOPY WITH PROPOFOL;  Surgeon: Milus Banister, MD;  Location: [facility];  Service: Endoscopy;  Laterality: N/A;   FLEXIBLE SIGMOIDOSCOPY N/A 07/12/2020   Procedure: FLEXIBLE SIGMOIDOSCOPY;  Surgeon: Milus Banister, MD;  Location: [facility];  Service: Endoscopy;  Laterality: N/A;   TONSILLECTOMY      Family History  Problem Relation Age of Onset   Heart disease Father        MI in late 83s, early 41s, smoker   Hypertension Mother    Pancreatic cancer Mother    Stroke Maternal Grandmother    Heart attack Maternal Grandfather        thinks in 42s   Stroke Paternal Grandmother    Tuberculosis Paternal Grandmother    Heart attack Paternal Grandfather    Colon cancer Neg Hx    Esophageal cancer Neg Hx     Medications- reviewed and updated Current Outpatient Medications  Medication Sig Dispense Refill   acetaminophen (TYLENOL) 500 MG tablet Take 2,000 mg by mouth daily as needed for moderate pain or headache.  albuterol (VENTOLIN HFA) 108 (90 Base) MCG/ACT inhaler Inhale 2 puffs into the lungs every 6 (six) hours as needed. For shortness of breath 18 g 5   Calcium Carb-Cholecalciferol (CALCIUM-VITAMIN D) 600-400 MG-UNIT TABS Take 1 tablet by mouth daily.     diphenhydrAMINE (BENADRYL) 25 mg capsule Take 25 mg by mouth daily as needed for allergies.     enalapril (VASOTEC) 5 MG tablet Take 1 tablet (5 mg total) by mouth daily. 90 tablet 3   hydrocortisone (ANUSOL-HC) 25 MG suppository Place 1 suppository (25 mg total) rectally daily. Place 1 suppository daily for 2 weeks, then PRN only. 30 suppository 6   levothyroxine (SYNTHROID) 50 MCG tablet Take 1 tablet (50 mcg total) by mouth daily. 90 tablet 3   Menthol, Topical Analgesic, (ICY HOT EX) Apply 1 application  topically daily as needed (knee pain).     Multiple Vitamins-Minerals (MULTIVITAMIN WITH MINERALS) tablet Take 1 tablet by mouth daily.     Tetrahydrozoline HCl (REDNESS RELIEVER EYE DROPS OP) Place 1 drop into both eyes daily as needed (redness).     vitamin B-12 (CYANOCOBALAMIN) 1000 MCG tablet Take 1,000 mcg by mouth daily.     No current facility-administered medications for this visit.    Allergies-reviewed and updated Allergies  Allergen Reactions   Aspirin     Gi upset   Ibuprofen     GI upset   Losartan Potassium-Hctz     itching   Peg 3350-Electrolytes Nausea And Vomiting    Vomiting (golytely)   Strawberry Extract Hives and Itching   Adhesive [Tape] Rash    Social History   Social History Narrative   Married (patient of Dr. 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, CPU games   Objective  Objective:  BP 130/78 Comment: thigh cuff right arm  Pulse 70   Temp 98 F (36.7 C)   Ht '4\' 10"'$  (1.473 m)   Wt 281 lb 3.2 oz (127.6 kg)   SpO2 94%   BMI 58.77 kg/m  Gen: NAD, resting comfortably HEENT: Mucous membranes are moist. Oropharynx normal Neck: no thyromegaly CV: RRR no murmurs rubs or gallops Lungs: CTAB no crackles, wheeze, rhonchi Abdomen: soft/nontender/nondistended/normal bowel sounds. No rebound or guarding. Morbid obesity noted Ext: no edema Skin: warm, dry Neuro: grossly normal, moves all extremities, PERRLA   Assessment and Plan   66 y.o. female presenting for annual physical.  Health Maintenance counseling: 1. Anticipatory guidance: Patient counseled regarding regular dental exams - advised q6 months- needs to schedule, eye exams - yearly,  avoiding smoking and second hand smoke , limiting alcohol to 1 beverage per day- doesn't drink , no illicit drugs .   2. Risk factor reduction:  Advised patient of need for regular exercise and diet rich and fruits and vegetables to reduce risk of heart attack and  stroke.  Exercise- limited by pain and mobility issues.  Diet/weight management-weight is improving again after previously stopping levothyroxine to try to save money- has worked hard on overall weight loss- wants to lose weight ot do bone density. Feels could cut down on chocolate  Wt Readings from Last 3 Encounters:  11/26/21 281 lb 3.2 oz (127.6 kg)  05/23/21 289 lb 12.8 oz (131.5 kg)  11/22/20 272 lb 9.6 oz (123.7 kg)  3. Immunizations/screenings/ancillary studies-flu shot today.  Discussed COVID vaccination at pharmacy- recommended.  Consider rsv vaccinations at pharmacy Immunization History  Administered Date(s)  Administered   Fluad Quad(high Dose 65+) 11/19/2018, 11/22/2020, 11/26/2021   Influenza Split 12/30/2010, 11/25/2011   Influenza Whole 11/17/2008, 11/20/2009   Influenza, High Dose Seasonal PF 11/22/2019   Influenza,inj,Quad PF,6+ Mos 11/02/2012, 04/03/2015, 03/13/2016, 10/29/2017   Influenza-Unspecified 11/22/2019   PFIZER(Purple Top)SARS-COV-2 Vaccination 03/31/2019, 04/25/2019, 02/14/2020, 04/01/2021   Pneumococcal Polysaccharide-23 01/06/2019   Td 02/17/2001   Tdap 08/09/2012   Zoster Recombinat (Shingrix) 04/01/2021, 06/01/2021  4. Cervical cancer screening- past age based screening recs- no blood or discharge. History of uterine cancer- had removal including cervix- was told no further pap smears. Ovaries also removed. released from GYN 5. Breast cancer screening-  mammogram last done 01/24/2021 - will do this fall  6. Colon cancer screening - last done 11/04/2018 wth a 10-year repeat planned 7. Skin cancer screening- saw derm for cyst in scalp- was not cancerous- no regular screening planned. advised regular sunscreen use. Denies worrisome, changing, or new skin lesions.   8. Birth control/STD check- monogamous and hysterectomy 9. Osteoporosis screening at 84- no record on file - patient had to get under 250 to have this done  10. Smoking associated screening - never  smoker  Status of chronic or acute concerns   #Social update-at last visit patient was not taking her meds-she was trying to bypass her other medicines to by her husband's medicine and wanted to hold off on labs until she was taking her medicine regularly-today she reports buying her meds again  #Dizziness-has had what actually sounds like to be vertigo 3-4 times since last office visit.  Notices room spinning and associated with nausea when getting out of bed- had one episode in grocery store- sat down had water and resolved.  No headaches with this.  Episodes usually lasts seconds to minutes - we will keep an eye on this since short term and sporadic- if worsens she will let us know- enocuraged her to stay well hydrated with prior orthostatic syncope  #hypertension S: medication: Enalapril 5 mg. Orthostatic issues Home readings #s: 150s on wrist cuff but holding hand down BP Readings from Last 3 Encounters:  11/26/21 130/78  05/23/21 140/64  11/22/20 138/89  A/P: stable on repeat- continue current med-s work on weight loss- has not had meds yet today so may go down further as ewll   #hyperlipidemia S: Medication:None-has wanted to work on weight loss instead of starting medicine and financial constraints have prevented CT cardiac scoring Lab Results  Component Value Date   CHOL 193 11/22/2020   HDL 60.80 11/22/2020   LDLCALC 99 11/22/2020   LDLDIRECT 118.0 04/03/2015   TRIG 164.0 (H) 11/22/2020   CHOLHDL 3 11/22/2020    A/P: elevated last year but improving- continue to work on weight loss- would love to do ct cardiac scoring but too costly for her  #hypothyroidism S: compliant On thyroid medication-levothyroxine 50 mcg Lab Results  Component Value Date   TSH 3.48 11/22/2020  A/P:hopefully stable- update tsh today. Continue current meds for now   # B12 deficiency S: Current treatment/medication (oral vs. IM): 1000 mcg daily- prior injections but cost concern Lab Results   Component Value Date   VITAMINB12 210 (L) 11/22/2020  A/P: hopefully improved back on meds- update b12 today. Continue current meds for now    #Vitamin D deficiency S: Medication: Calcium 600 mg 2 tablets daily and 10,000 units weekly of vitamin D Last vitamin D Lab Results  Component Value Date   VD25OH 24.11 (L) 11/22/2020  A/P: hopefully improved back  on meds- update vitamin D today. Continue current meds for now  # Hyperglycemia/insulin resistance/prediabetes- a1c as high as 5.8 in 2020 prior to weight loss S:  Medication: none Exercise and diet-  a1c improving with weight loss- exercise limited  A/P: update a1c- hopefully remains in healthy range- glad she is working on weight loss   Recommended follow up: Return in about 6 months (around 05/28/2022) for followup or sooner if needed.Schedule b4 you leave.  Lab/Order associations: fasting   ICD-10-CM   1. Preventative health care  Z00.00     2. Need for immunization against influenza  Z23 Flu Vaccine QUAD High Dose(Fluad)    3. Hyperlipidemia, unspecified hyperlipidemia type  E78.5 CBC with Differential/Platelet    Comprehensive metabolic panel    Lipid panel    4. Hypothyroidism, unspecified type  E03.9 TSH    5. Hyperglycemia  R73.9 HgB A1c    6. Vitamin D deficiency  E55.9 Vitamin D (25 hydroxy)    7. B12 deficiency  E53.8 B12      No orders of the defined types were placed in this encounter.   Return precautions advised.  Garret Reddish, MD

## 2021-11-26 NOTE — Patient Instructions (Addendum)
Health Maintenance Due  Topic Date Due   COVID-19 Vaccine (5 - Pfizer series)- get at pharmacy and let us know 05/27/2021   Schedule visit with dentist  Please stop by lab before you go If you have mychart- we will send your results within 3 business days of [place] receiving them.  If you do not have mychart- we will call you about results within 5 business days of [place] receiving them.  *please also note that you will see labs on mychart as soon as they post. I will later go in and write notes on them- will say "notes from Dr. Yong Channel"   Recommended follow up: Return in about 6 months (around 05/28/2022) for followup or sooner if needed.Schedule b4 you leave.

## 2021-11-27 ENCOUNTER — Other Ambulatory Visit: Payer: Self-pay | Admitting: Family Medicine

## 2021-11-27 LAB — VITAMIN B12: Vitamin B-12: 459 pg/mL (ref 211–911)

## 2021-11-27 LAB — TSH: TSH: 2.59 u[IU]/mL (ref 0.35–5.50)

## 2021-11-27 LAB — VITAMIN D 25 HYDROXY (VIT D DEFICIENCY, FRACTURES): VITD: 28.96 ng/mL — ABNORMAL LOW (ref 30.00–100.00)

## 2021-11-27 MED ORDER — VITAMIN D (ERGOCALCIFEROL) 1.25 MG (50000 UNIT) PO CAPS: 50000.0000 [IU] | ORAL_CAPSULE | ORAL | 1 refills | Status: AC

## 2022-01-30 DIAGNOSIS — Z1231 Encounter for screening mammogram for malignant neoplasm of breast: Secondary | ICD-10-CM | POA: Diagnosis not present

## 2022-01-30 LAB — HM MAMMOGRAPHY

## 2022-05-28 ENCOUNTER — Ambulatory Visit (INDEPENDENT_AMBULATORY_CARE_PROVIDER_SITE_OTHER): Payer: PPO | Admitting: Family Medicine

## 2022-05-28 ENCOUNTER — Encounter: Payer: Self-pay | Admitting: Family Medicine

## 2022-05-28 VITALS — BP 144/76 | HR 82 | Temp 97.5°F | Ht <= 58 in | Wt 276.4 lb

## 2022-05-28 DIAGNOSIS — R739 Hyperglycemia, unspecified: Secondary | ICD-10-CM

## 2022-05-28 DIAGNOSIS — E039 Hypothyroidism, unspecified: Secondary | ICD-10-CM | POA: Diagnosis not present

## 2022-05-28 DIAGNOSIS — I10 Essential (primary) hypertension: Secondary | ICD-10-CM

## 2022-05-28 LAB — HEMOGLOBIN A1C: Hgb A1c MFr Bld: 5.9 % (ref 4.6–6.5)

## 2022-05-28 LAB — TSH: TSH: 5.12 u[IU]/mL (ref 0.35–5.50)

## 2022-05-28 NOTE — Patient Instructions (Addendum)
blood pressure is elevated- asked her to restart enalapril and update [place] with home blood pressures about a week or 2 after starting back  Please stop by lab before you go If you have mychart- we will send your results within 3 business days of [place] receiving them.  If you do not have mychart- we will call you about results within 5 business days of [place] receiving them.  *please also note that you will see labs on mychart as soon as they post. I will later go in and write notes on them- will say "notes from Dr. Durene Cal"   We will see if you need the thyroid medicine based on labs today or if weight goes up after visit despite your continued efforts- we would need to recheck   Recommended follow up: Return in about 7 months (around 12/28/2022) for physical or sooner if needed.Schedule b4 you leave.

## 2022-05-28 NOTE — Progress Notes (Signed)
Phone [phone] In person visit   Subjective:   Melissa Kim is a 67 y.o. year old very pleasant female patient who presents for/with See problem oriented charting Chief Complaint  Patient presents with   Follow-up    Pt states her sister had to have a pacemaker placed and she wants to make sure this wont need to be done with her.    Hyperlipidemia   Hypertension   Hypothyroidism   Past Medical History-  Patient Active Problem List   Diagnosis Date Noted   Morbid obesity 11/30/2007    Priority: High   Hyperlipidemia, unspecified 11/19/2018    Priority: Medium    Hypothyroidism, unspecified 11/19/2018    Priority: Medium    History of uterine cancer 03/28/2014    Priority: Medium    Hyperglycemia 03/28/2014    Priority: Medium    Overactive bladder 09/24/2009    Priority: Medium    Osteopenia 12/05/2008    Priority: Medium    Essential hypertension 06/01/2007    Priority: Medium    Asthma 09/29/2006    Priority: Medium    Psoriasis 09/29/2006    Priority: Medium    B12 deficiency 11/22/2019    Priority: Low   History of total hysterectomy 08/27/2017    Priority: Low   Osteoarthritis, knee 03/28/2014    Priority: Low   Vitamin D deficiency 11/20/2009    Priority: Low   Venous (peripheral) insufficiency 09/29/2006    Priority: Low   GERD 09/29/2006    Priority: Low   History of colonic polyps    Rectal bleeding     Medications- reviewed and updated Current Outpatient Medications  Medication Sig Dispense Refill   acetaminophen (TYLENOL) 500 MG tablet Take 2,000 mg by mouth daily as needed for moderate pain or headache.      albuterol (VENTOLIN HFA) 108 (90 Base) MCG/ACT inhaler Inhale 2 puffs into the lungs every 6 (six) hours as needed. For shortness of breath 18 g 5   Calcium Carb-Cholecalciferol (CALCIUM-VITAMIN D) 600-400 MG-UNIT TABS Take 1 tablet by mouth daily.     diphenhydrAMINE (BENADRYL) 25 mg capsule Take 25 mg by mouth daily as needed  for allergies.     enalapril (VASOTEC) 5 MG tablet Take 1 tablet (5 mg total) by mouth daily. 90 tablet 3   hydrocortisone (ANUSOL-HC) 25 MG suppository Place 1 suppository (25 mg total) rectally daily. Place 1 suppository daily for 2 weeks, then PRN only. 30 suppository 6   levothyroxine (SYNTHROID) 50 MCG tablet Take 1 tablet (50 mcg total) by mouth daily. 90 tablet 3   Menthol, Topical Analgesic, (ICY HOT EX) Apply 1 application topically daily as needed (knee pain).     Multiple Vitamins-Minerals (MULTIVITAMIN WITH MINERALS) tablet Take 1 tablet by mouth daily.     Tetrahydrozoline HCl (REDNESS RELIEVER EYE DROPS OP) Place 1 drop into both eyes daily as needed (redness).     vitamin B-12 (CYANOCOBALAMIN) 1000 MCG tablet Take 1,000 mcg by mouth daily.     Vitamin D, Ergocalciferol, (DRISDOL) 1.25 MG (50000 UNIT) CAPS capsule Take 1 capsule (50,000 Units total) by mouth every 7 (seven) days. 13 capsule 1   No current facility-administered medications for this visit.     Objective:  BP (!) 144/76   Pulse 82   Temp (!) 97.5 F (36.4 C)   Ht 4\' 10"  (1.473 m)   Wt 276 lb 6.4 oz (125.4 kg)   SpO2 97%   BMI 57.77 kg/m  Gen: NAD,  resting comfortably CV: RRR no murmurs rubs or gallops Lungs: CTAB no crackles, wheeze, rhonchi Abdomen: soft/nontender/nondistended/normal active bowel sounds for fasting. No rebound or guarding.  Ext: trace edema Skin: warm, dry     Assessment and Plan   #vertigo/new family history of pacemaker- had episodes of this back in January. Her sister just had pacemaker placed and she reflected back on syncope episode in 2022- she is concerned if she might need pacemaker as well. Symptoms have completely resolved- did encourage her to see [place] ASAP if recurrent issues and we can update EKG -discussed possible cardiac monitor and she wants to hold of for now  #hypertension S: medication: Enalapril 5 mg- stopped taking due to cost concern- financial situation has  improved but hasn't picked up Home readings #s: has cuff but wrist and doesn't check  BP Readings from Last 3 Encounters:  05/28/22 (!) 144/76  11/26/21 130/78  05/23/21 140/64  A/P: blood pressure is elevated- asked her to restart enalapril and update [place] with home blood pressures about a week or 2 after starting back   #hyperlipidemia S: Medication:None-has wanted to work on weight loss instead of starting medicine and financial constraints have prevented CT cardiac scoring Lab Results  Component Value Date   CHOL 212 (H) 11/26/2021   HDL 67.40 11/26/2021   LDLCALC 117 (H) 11/26/2021   LDLDIRECT 118.0 04/03/2015   TRIG 137.0 11/26/2021   CHOLHDL 3 11/26/2021   A/P: cholesterol high last visit- she wants to hold off on medications and work on lifestyle- also hold off on testing as concerned about cost  #hypothyroidism S: compliant On thyroid medication-levothyroxine 50 mcg but has been off for a month Lab Results  Component Value Date   TSH 2.59 11/26/2021   Wt Readings from Last 3 Encounters:  05/28/22 276 lb 6.4 oz (125.4 kg)  11/26/21 281 lb 3.2 oz (127.6 kg)  05/23/21 289 lb 12.8 oz (131.5 kg)  A/P: hopefully stable- update tsh today. Restart medicine if levels are off OR if later begins to gain weight despite her weight loss efforts  #Vitamin D deficiency S: Medication: Calcium 600 mg 2 tablets daily and 10,000 units weekly of vitamin D in past- sent in 30160 units but she is not taking  Last vitamin D Lab Results  Component Value Date   VD25OH 28.96 (L) 11/26/2021  A/P: hopefully improved- update vitamin D today. Continue current meds for now- may need high dose if levels didn'tt improve- she also is considering just taking 10932 units 5 days a week which would be fine     # Hyperglycemia/insulin resistance/prediabetes- a1c as high as 5.8 in 2020 prior to weight loss #morbid obesity status -noted but slightly improved S:  Medication: none Exercise and diet-  a1c  improving with weight loss in general- exercise limited Lab Results  Component Value Date   HGBA1C 5.7 11/26/2021   HGBA1C 5.3 11/22/2020   HGBA1C 5.5 09/30/2019   A/P: worsened some last visit- update a1c with labs- continue to work on weight loss- congratulated on efforts  Recommended follow up: Return in about 7 months (around 12/28/2022) for physical or sooner if needed.Schedule b4 you leave.  Lab/Order associations:   ICD-10-CM   1. Essential hypertension  I10     2. Hyperglycemia  R73.9 HgB A1c    3. Hypothyroidism, unspecified type  E03.9 TSH    4. Morbid obesity Chronic E66.01       No orders of the defined types were placed in  this encounter.   Return precautions advised.  Garret Reddish, MD

## 2022-06-26 ENCOUNTER — Inpatient Hospital Stay: Payer: PPO | Attending: Genetic Counselor | Admitting: Genetic Counselor

## 2022-06-26 ENCOUNTER — Encounter: Payer: Self-pay | Admitting: Genetic Counselor

## 2022-06-26 ENCOUNTER — Other Ambulatory Visit: Payer: Self-pay | Admitting: Genetic Counselor

## 2022-06-26 ENCOUNTER — Inpatient Hospital Stay: Payer: PPO

## 2022-06-26 DIAGNOSIS — Z8542 Personal history of malignant neoplasm of other parts of uterus: Secondary | ICD-10-CM

## 2022-06-26 DIAGNOSIS — Z800 Family history of malignant neoplasm of digestive organs: Secondary | ICD-10-CM | POA: Diagnosis not present

## 2022-06-26 DIAGNOSIS — Z86010 Personal history of colonic polyps: Secondary | ICD-10-CM | POA: Diagnosis not present

## 2022-06-26 LAB — GENETIC SCREENING ORDER

## 2022-06-26 NOTE — Progress Notes (Signed)
REFERRING PROVIDER: Shelva Majestic, MD [address] [place],  [place] [address]  PRIMARY PROVIDER:  Shelva Majestic, MD  PRIMARY REASON FOR VISIT:  1. Family history of pancreatic cancer   2. History of uterine cancer   3. History of colonic polyps      HISTORY OF PRESENT ILLNESS:   [name], a 67 y.o. female, was seen for a [place] cancer genetics consultation due to a personal and family history of cancer.  [name] presents to clinic today to discuss the possibility of a hereditary predisposition to cancer, genetic testing, and to further clarify her future cancer risks, as well as potential cancer risks for family members.   In 1993, at the age of 19, [name] was diagnosed with uterine cancer. The treatment plan included a hysterectomy.     CANCER HISTORY:  Oncology History   No history exists.     RISK FACTORS:  Colonoscopy: yes;  multiple colon polyps .  Past Medical History:  Diagnosis Date   Asthma    Cancer (HCC)    uterine   Complication of anesthesia    when Ether being used- almost did not wake up from Anesthesia   DIVERTICULITIS, HX OF 05/30/2008   Diverticulosis    DIVERTICULOSIS, COLON 05/30/2008   Family history of adverse reaction to anesthesia    sister -hard to keep asleep with anesthesia   Family history of pancreatic cancer    Fx foot bone NEC-closed    GERD (gastroesophageal reflux disease)    Hypertension    Obesity    PONV (postoperative nausea and vomiting)    Psoriasis    STRESS FRACTURE, FOOT 12/05/2008   reports multiple   Venous stasis dermatitis     Past Surgical History:  Procedure Laterality Date   ABDOMINAL HYSTERECTOMY     complete reported including cervix. For uterine cancer. released by GYN.    APPENDECTOMY     arthroscopic of knee     CHOLECYSTECTOMY     COLONOSCOPY WITH PROPOFOL N/A 11/04/2018   Procedure: COLONOSCOPY WITH PROPOFOL;  Surgeon: Rachael Fee, MD;  Location: [facility];  Melissa Kim:  Endoscopy;  Laterality: N/A;   FLEXIBLE SIGMOIDOSCOPY N/A 07/12/2020   Procedure: FLEXIBLE SIGMOIDOSCOPY;  Surgeon: Rachael Fee, MD;  Location: [facility];  Melissa Kim: Endoscopy;  Laterality: N/A;   TONSILLECTOMY      Social History   Socioeconomic History   Marital status: Married    Spouse name: Not on file   Number of children: Not on file   Years of education: Not on file   Highest education level: Not on file  Occupational History   Occupation: retired  Tobacco Use   Smoking status: Never   Smokeless tobacco: Never  Vaping Use   Vaping Use: Never used  Substance and Sexual Activity   Alcohol use: No   Drug use: No   Sexual activity: Not on file  Other Topics Concern   Not on file  Social History Narrative   Married (patient of Dr. 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, CPU games   Social Determinants of Health   Financial Resource Strain: Low Risk  (02/02/2020)   Overall Financial Resource Strain (CARDIA)    Difficulty of Paying Living Expenses: Not hard at all  Food Insecurity: No Food Insecurity (02/02/2020)   Hunger Vital Sign    Worried About Running Out of  Food in the Last Year: Never true    Ran Out of Food in the Last Year: Never true  Transportation Needs: No Transportation Needs (02/02/2020)   PRAPARE - Administrator, Civil Melissa Kim (Medical): No    Lack of Transportation (Non-Medical): No  Physical Activity: Inactive (02/02/2020)   Exercise Vital Sign    Days of Exercise per Week: 0 days    Minutes of Exercise per Session: 0 min  Stress: No Stress Concern Present (02/02/2020)   Melissa Kim of Occupational Health - Occupational Stress Questionnaire    Feeling of Stress : Not at all  Social Connections: Moderately Isolated (02/02/2020)   Social Connection and Isolation Panel [NHANES]    Frequency of Communication with Friends and Family: More than three times a week    Frequency of  Social Gatherings with Friends and Family: Once a week    Attends Religious Services: Never    Database administrator or Organizations: No    Attends Engineer, structural: Never    Marital Status: Married     FAMILY HISTORY:  We obtained a detailed, 4-generation family history.  Significant diagnoses are listed below: Family History  Problem Relation Age of Onset   Hypertension Mother    Pancreatic cancer Mother        d. 33s   Heart disease Father        MI in late 36s, early 58s, smoker   Arrhythmia Sister        bradycardia led to pacemaker   Cancer Maternal Uncle        NOS   Cancer Paternal Aunt        NOS   Stroke Maternal Grandmother    Heart attack Maternal Grandfather        thinks in 13s   Stroke Paternal Grandmother    Tuberculosis Paternal Grandmother    Heart attack Paternal Grandfather    Colon cancer Neg Hx    Esophageal cancer Neg Hx     The patient does not have children.  She has two sisters and a brother who are cancer free.  Her parents are deceased.    The patient's mother had pancreatic cancer and died in her 33's.  She had two sisters and a brother.  The brother had a non specified cancer.  The maternal grandparents are deceased.  The patient's father is deceased.  He had two sisters and a brother.  One sister had a cancer that was not specified.  The paternal grandparents are deceased.  Melissa Kim is unaware of previous family history of genetic testing for hereditary cancer risks.   GENETIC COUNSELING ASSESSMENT: [name] is a 67 y.o. female with a personal and family history of cancer which is somewhat suggestive of a hereditary condition such as Lynch syndrome and predisposition to cancer given her young age of uterine cancer and the family history of pancreatic cancer. We, therefore, discussed and recommended the following at today's visit.   DISCUSSION: We discussed that, in general, most cancer is not inherited in families, but  instead is sporadic or familial. Sporadic cancers occur by chance and typically happen at older ages (>50 years) as this type of cancer is caused by genetic changes acquired during an individual's lifetime. Some families have more cancers than would be expected by chance; however, the ages or types of cancer are not consistent with a known genetic mutation or known genetic mutations have been ruled out. This type of  familial cancer is thought to be due to a combination of multiple genetic, environmental, hormonal, and lifestyle factors. While this combination of factors likely increases the risk of cancer, the exact source of this risk is not currently identifiable or testable.  We discussed that 3 - 5% of uterine cancer is hereditary, with most cases associated with Lynch syndrome.  There are other genes that can be associated with hereditary uterine cancer syndromes.  These include PTEN.  We discussed that testing is beneficial for several reasons including knowing how to follow individuals after completing their treatment, identifying whether potential treatment options such as PARP inhibitors would be beneficial, and understand if other family members could be at risk for cancer and allow them to undergo genetic testing.   We reviewed the characteristics, features and inheritance patterns of hereditary cancer syndromes. We also discussed genetic testing, including the appropriate family members to test, the process of testing, insurance coverage and turn-around-time for results. We discussed the implications of a negative, positive, carrier and/or variant of uncertain significant result. [name]  was offered a common hereditary cancer panel (47 genes) and an expanded pan-cancer panel (77 genes). [name] was informed of the benefits and limitations of each panel, including that expanded pan-cancer panels contain genes that do not have clear management guidelines at this point in time.  We also discussed  that as the number of genes included on a panel increases, the chances of variants of uncertain significance increases. [name] decided to pursue genetic testing for the Multi-cancer + RNA gene panel.   The Multi-Cancer + RNA Panel offered by [facility] includes sequencing and/or deletion/duplication analysis of the following 70 genes:  AIP*, ALK, APC*, ATM*, AXIN2*, BAP1*, BARD1*, BLM*, BMPR1A*, BRCA1*, BRCA2*, BRIP1*, CDC73*, CDH1*, CDK4, CDKN1B*, CDKN2A, CHEK2*, CTNNA1*, DICER1*, EPCAM (del/dup only), EGFR, FH*, FLCN*, GREM1 (promoter dup only), HOXB13, KIT, LZTR1, MAX*, MBD4, MEN1*, MET, MITF, MLH1*, MSH2*, MSH3*, MSH6*, MUTYH*, NF1*, NF2*, NTHL1*, PALB2*, PDGFRA, PMS2*, POLD1*, POLE*, POT1*, PRKAR1A*, PTCH1*, PTEN*, RAD51C*, RAD51D*, RB1*, RET, SDHA* (sequencing only), SDHAF2*, SDHB*, SDHC*, SDHD*, SMAD4*, SMARCA4*, SMARCB1*, SMARCE1*, STK11*, SUFU*, TMEM127*, TP53*, TSC1*, TSC2*, VHL*. RNA analysis is performed for * genes.   Based on [name]'s personal and family history of cancer, she meets medical criteria for genetic testing. Despite that she meets criteria, she may still have an out of pocket cost. We discussed that if her out of pocket cost for testing is over $100, the laboratory will call and confirm whether she wants to proceed with testing.  If the out of pocket cost of testing is less than $100 she will be billed by the genetic testing laboratory.   PLAN: After considering the risks, benefits, and limitations, [name] provided informed consent to pursue genetic testing and the blood sample was sent to [facility] for analysis of the Multi-cancer + RNA gene panel. Results should be available within approximately 2-3 weeks' time, at which point they will be disclosed by telephone to [name], as will any additional recommendations warranted by these results. [name] will receive a summary of her genetic counseling visit and a copy of her results once available. This  information will also be available in Epic.   Lastly, we encouraged [name] to remain in contact with cancer genetics annually so that we can continuously update the family history and inform her of any changes in cancer genetics and testing that may be of benefit for this family.   [name] questions were answered  to her satisfaction today. Our contact information was provided should additional questions or concerns arise. Thank you for the referral and allowing [place] to share in the care of your patient.   Westyn Melissa Kim P. Melissa Guitar, MS, [facility] Licensed, Patent attorney [email or web address] phone: [phone]  The patient was seen for a total of 25 minutes in face-to-face genetic counseling.  The patient brought her husband. Drs. Meliton Rattan, and/or [place] were available for questions, if needed..    _______________________________________________________________________ For Office Staff:  Number of people involved in session: 2 Was an Intern/ student involved with case: no

## 2022-07-03 ENCOUNTER — Telehealth: Payer: Self-pay | Admitting: Genetic Counselor

## 2022-07-03 ENCOUNTER — Encounter: Payer: Self-pay | Admitting: Genetic Counselor

## 2022-07-03 DIAGNOSIS — Z1379 Encounter for other screening for genetic and chromosomal anomalies: Secondary | ICD-10-CM | POA: Insufficient documentation

## 2022-07-03 NOTE — Telephone Encounter (Signed)
Revealed negative genetic testing.  Discussed that we do not know why she has uterine cancer or why there is cancer in the family. It could be due to a different gene that we are not testing, or maybe our current technology may not be able to pick something up.  It will be important for her to keep in contact with genetics to keep up with whether additional testing may be needed.  

## 2022-07-04 ENCOUNTER — Ambulatory Visit: Payer: Self-pay | Admitting: Genetic Counselor

## 2022-07-04 DIAGNOSIS — Z8542 Personal history of malignant neoplasm of other parts of uterus: Secondary | ICD-10-CM

## 2022-07-04 DIAGNOSIS — Z1379 Encounter for other screening for genetic and chromosomal anomalies: Secondary | ICD-10-CM

## 2022-07-04 NOTE — Progress Notes (Signed)
HPI:  Ms. Ervine was previously seen in the [facility] due to a personal and family history of cancer and concerns regarding a hereditary predisposition to cancer. Please refer to our prior cancer genetics clinic note for more information regarding our discussion, assessment and recommendations, at the time. Ms. Gowans's recent genetic test results were disclosed to her, as were recommendations warranted by these results. These results and recommendations are discussed in more detail below.  CANCER HISTORY:  Oncology History   No history exists.    FAMILY HISTORY:  We obtained a detailed, 4-generation family history.  Significant diagnoses are listed below: Family History  Problem Relation Age of Onset   Hypertension Mother    Pancreatic cancer Mother        d. 71s   Heart disease Father        MI in late 55s, early 82s, smoker   Arrhythmia Sister        bradycardia led to pacemaker   Cancer Maternal Uncle        NOS   Cancer Paternal Aunt        NOS   Stroke Maternal Grandmother    Heart attack Maternal Grandfather        thinks in 32s   Stroke Paternal Grandmother    Tuberculosis Paternal Grandmother    Heart attack Paternal Grandfather    Colon cancer Neg Hx    Esophageal cancer Neg Hx      The patient does not have children.  She has two sisters and a brother who are cancer free.  Her parents are deceased.     The patient's mother had pancreatic cancer and died in her 46's.  She had two sisters and a brother.  The brother had a non specified cancer.  The maternal grandparents are deceased.   The patient's father is deceased.  He had two sisters and a brother.  One sister had a cancer that was not specified.  The paternal grandparents are deceased.   Ms. Mellin is unaware of previous family history of genetic testing for hereditary cancer risks.   GENETIC TEST RESULTS: Genetic testing reported out on Jul 02, 2022 through the Multi-cancer + RNA  cancer panel found no pathogenic mutations. The Multi-Cancer + RNA Panel offered by [facility] includes sequencing and/or deletion/duplication analysis of the following 70 genes:  AIP*, ALK, APC*, ATM*, AXIN2*, BAP1*, BARD1*, BLM*, BMPR1A*, BRCA1*, BRCA2*, BRIP1*, CDC73*, CDH1*, CDK4, CDKN1B*, CDKN2A, CHEK2*, CTNNA1*, DICER1*, EPCAM (del/dup only), EGFR, FH*, FLCN*, GREM1 (promoter dup only), HOXB13, KIT, LZTR1, MAX*, MBD4, MEN1*, MET, MITF, MLH1*, MSH2*, MSH3*, MSH6*, MUTYH*, NF1*, NF2*, NTHL1*, PALB2*, PDGFRA, PMS2*, POLD1*, POLE*, POT1*, PRKAR1A*, PTCH1*, PTEN*, RAD51C*, RAD51D*, RB1*, RET, SDHA* (sequencing only), SDHAF2*, SDHB*, SDHC*, SDHD*, SMAD4*, SMARCA4*, SMARCB1*, SMARCE1*, STK11*, SUFU*, TMEM127*, TP53*, TSC1*, TSC2*, VHL*. RNA analysis is performed for * genes. The test report has been scanned into EPIC and is located under the Molecular Pathology section of the Results Review tab.  A portion of the result report is included below for reference.     We discussed with Ms. Niziol that because current genetic testing is not perfect, it is possible there may be a gene mutation in one of these genes that current testing cannot detect, but that chance is small.  We also discussed, that there could be another gene that has not yet been discovered, or that we have not yet tested, that is responsible for the cancer diagnoses in the family. It is  also possible there is a hereditary cause for the cancer in the family that Ms. Tregoning did not inherit and therefore was not identified in her testing.  Therefore, it is important to remain in touch with cancer genetics in the future so that we can continue to offer Ms. Runner the most up to date genetic testing.   ADDITIONAL GENETIC TESTING: We discussed with Ms. Joshua that her genetic testing was fairly extensive.  If there are genes identified to increase cancer risk that can be analyzed in the future, we would be happy to discuss and coordinate this testing at  that time.    CANCER SCREENING RECOMMENDATIONS: Ms. Marana test result is considered negative (normal).  This means that we have not identified a hereditary cause for her personal and family history of cancer at this time. Most cancers happen by chance and this negative test suggests that her cancer may fall into this category.    Possible reasons for Ms. Bosko's negative genetic test include:  1. There may be a gene mutation in one of these genes that current testing methods cannot detect but that chance is small.  2. There could be another gene that has not yet been discovered, or that we have not yet tested, that is responsible for the cancer diagnoses in the family.  3.  There may be no hereditary risk for cancer in the family. The cancers in Ms. Springborn and/or her family may be sporadic/familial or due to other genetic and environmental factors. 4. It is also possible there is a hereditary cause for the cancer in the family that Ms. Schilling did not inherit.  Therefore, it is recommended she continue to follow the cancer management and screening guidelines provided by her primary healthcare provider. An individual's cancer risk and medical management are not determined by genetic test results alone. Overall cancer risk assessment incorporates additional factors, including personal medical history, family history, and any available genetic information that may result in a personalized plan for cancer prevention and surveillance   RECOMMENDATIONS FOR FAMILY MEMBERS:  Individuals in this family might be at some increased risk of developing cancer, over the general population risk, simply due to the family history of cancer.  We recommended women in this family have a yearly mammogram beginning at age 51, or 55 years younger than the earliest onset of cancer, an annual clinical breast exam, and perform monthly breast self-exams. Women in this family should also have a gynecological exam as recommended  by their primary provider. All family members should be referred for colonoscopy starting at age 65.  FOLLOW-UP: Lastly, we discussed with Ms. Finkelstein that cancer genetics is a rapidly advancing field and it is possible that new genetic tests will be appropriate for her and/or her family members in the future. We encouraged her to remain in contact with cancer genetics on an annual basis so we can update her personal and family histories and let her know of advances in cancer genetics that may benefit this family.   Our contact number was provided. Ms. Mcgahee's questions were answered to her satisfaction, and she knows she is welcome to call us at anytime with additional questions or concerns.   Maylon Cos, MS, [facility] Licensed, Certified Genetic Counselor [email or web address]

## 2022-07-10 IMAGING — CR DG KNEE COMPLETE 4+V*R*
4 series · 4 of 4 positions shown · non-contrast
Comparison: None.

CLINICAL DATA: 65-year-old female status post syncope and fall.

EXAM:
RIGHT KNEE - COMPLETE 4+ VIEW

[knee ap]
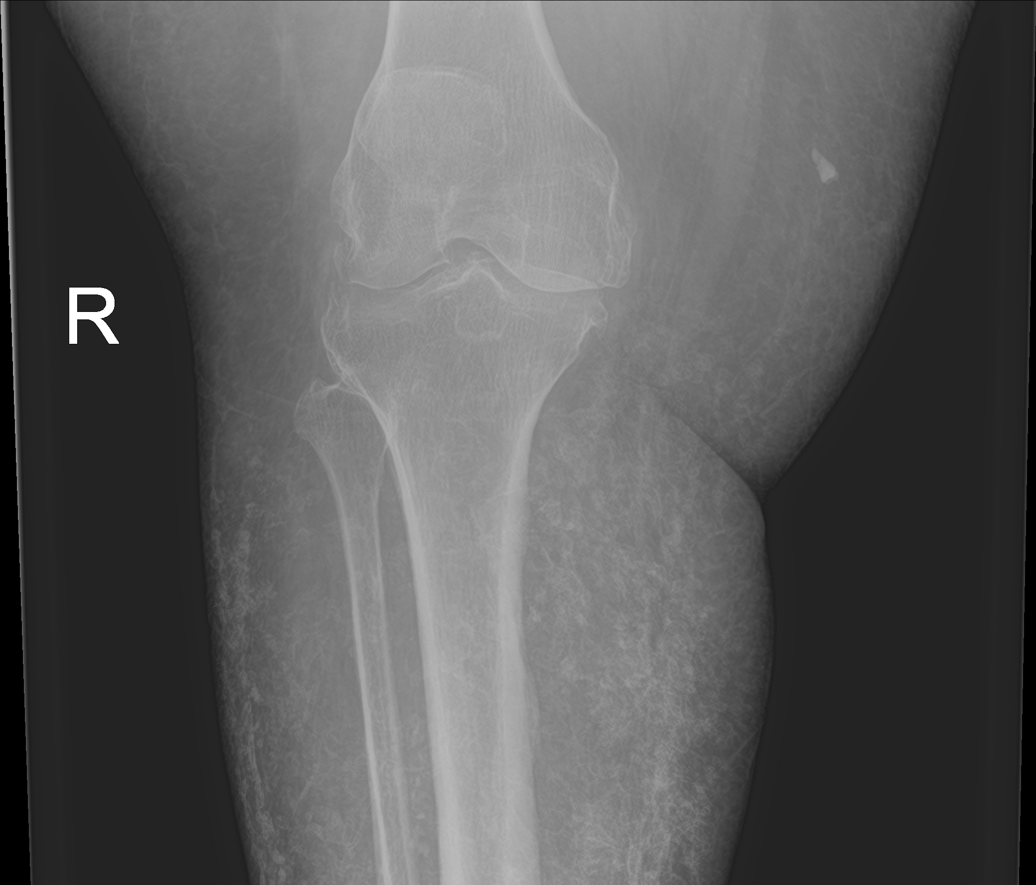

[knee lat]
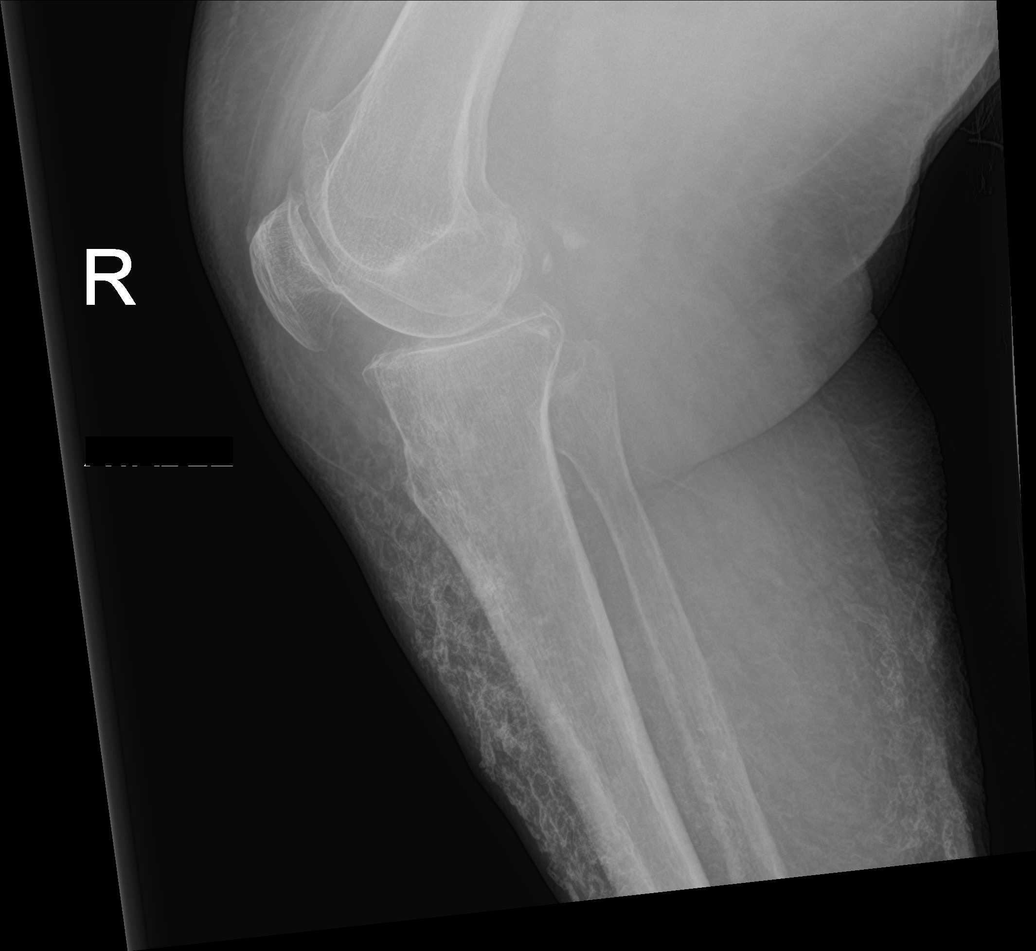

[knee obl (1 of 2)]
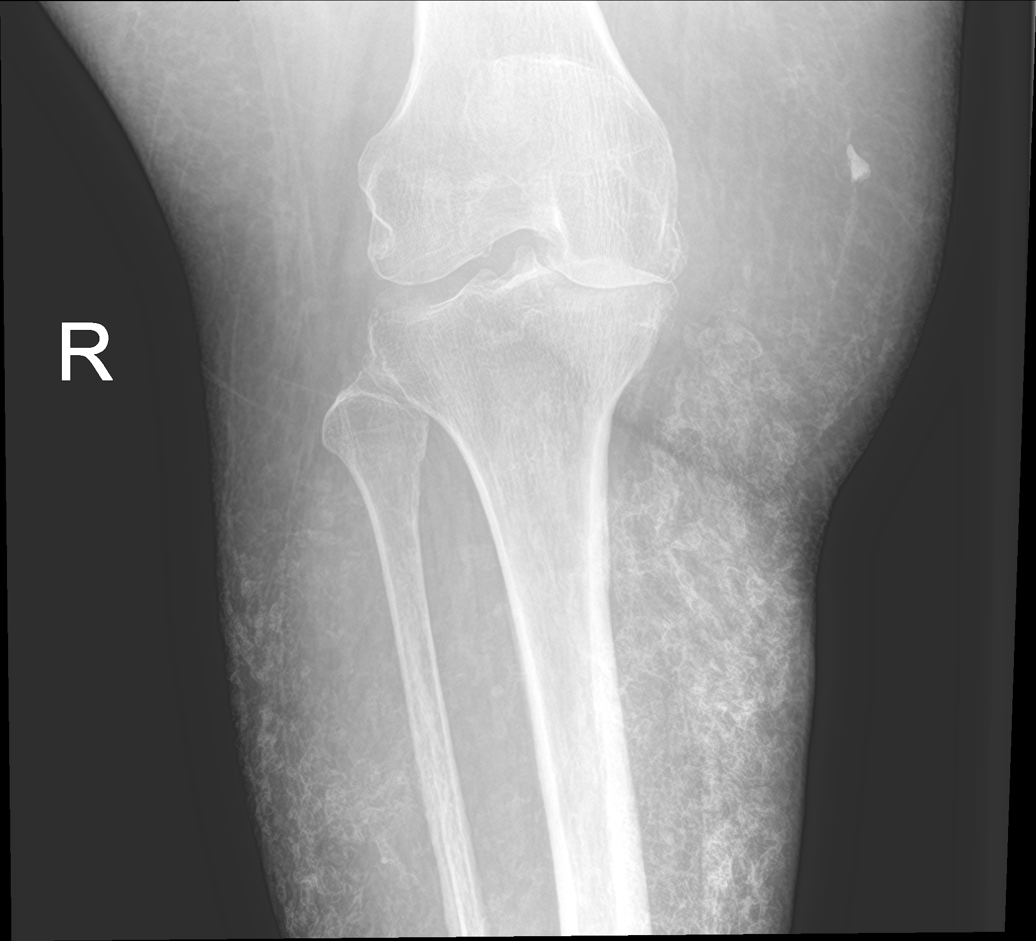

[knee obl (2 of 2)]
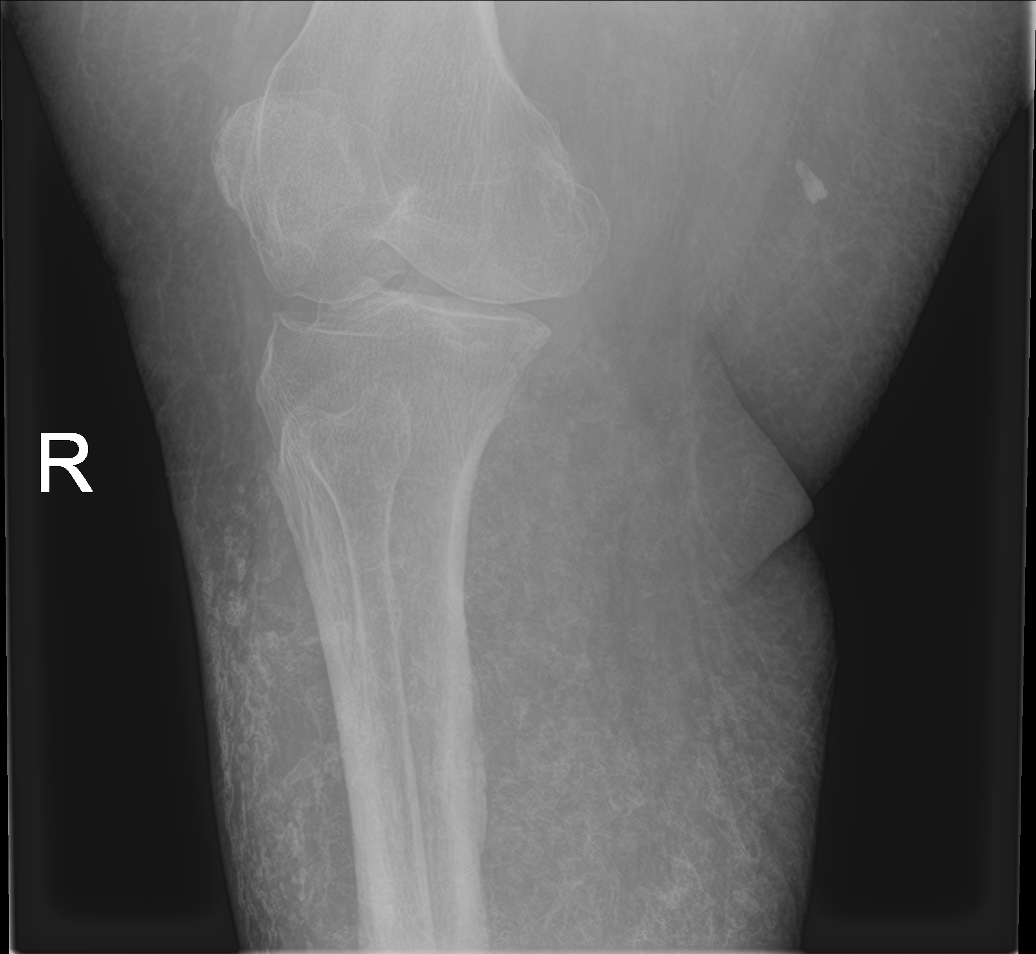

[4 of 4 positions shown; findings below may reference images not displayed]

FINDINGS: No joint effusion identified on cross-table lateral view. Severe
medial and patellofemoral compartment joint space loss with bulky
tricompartmental degenerative spurring. Osteopenia. Patella intact.
No acute osseous abnormality identified. Widespread dystrophic soft
tissue calcifications.
IMPRESSION: 1. Advanced degenerative changes. No acute fracture or dislocation
identified about the right knee.
2. Widespread dystrophic soft tissue calcifications.

## 2022-09-10 ENCOUNTER — Telehealth: Payer: Self-pay

## 2022-09-10 NOTE — Patient Outreach (Signed)
  Care Coordination   Care Coordinator  Visit Note   09/10/2022 Name: Melissa Kim MRN: [id number] DOB: October 14, 1955  Melissa Kim is a 67 y.o. year old female who sees Melissa Kim, Melissa Contes, MD for primary care. I spoke with  Melissa Kim by phone today.  What matters to the patients health and wellness today?  Melissa Kim called today to inform that her husband has passed and wanted to know what to do with his OTC care and Healthy food card. RNCM offered condolences. Discussed LSCW re: grief counseling as needed. Melissa Kim reports she has support from family and does not need at this time. RNCM provided [facility] contact name for practice [facility] Halsted [phone]). Ms. Kim without any other needs at this time.   Goals Addressed             This Visit's Progress    COMPLETED: Care Coordination       Interventions Today    Flowsheet Row Most Recent Value  Education Interventions   Education Provided Provided Education  Provided Verbal Education On Insurance Plans  [advised Melissa Kim to contact number on the back of husbands insurance care to get insurance questions answered.]  Mental Health Interventions   Mental Health Discussed/Reviewed Grief and Loss  [offered condlences, discussed LCSW available for talk therapy if needed. provided contact number for RNCM assigned to the practice]            SDOH assessments and interventions completed:  No  Care Coordination Interventions:  Yes, provided   Follow up plan: No further intervention required.   Encounter Outcome:  Pt. Visit Completed   Kathyrn Sheriff, RN, MSN, BSN, CCM [facility] Care Coordinator [phone]

## 2022-12-29 ENCOUNTER — Encounter: Payer: Self-pay | Admitting: Family Medicine

## 2022-12-29 ENCOUNTER — Ambulatory Visit (INDEPENDENT_AMBULATORY_CARE_PROVIDER_SITE_OTHER): Payer: PPO | Admitting: Family Medicine

## 2022-12-29 VITALS — BP 136/88 | HR 68 | Temp 97.0°F | Ht <= 58 in | Wt 308.4 lb

## 2022-12-29 DIAGNOSIS — Z131 Encounter for screening for diabetes mellitus: Secondary | ICD-10-CM | POA: Diagnosis not present

## 2022-12-29 DIAGNOSIS — I10 Essential (primary) hypertension: Secondary | ICD-10-CM | POA: Diagnosis not present

## 2022-12-29 DIAGNOSIS — R739 Hyperglycemia, unspecified: Secondary | ICD-10-CM

## 2022-12-29 DIAGNOSIS — E039 Hypothyroidism, unspecified: Secondary | ICD-10-CM

## 2022-12-29 DIAGNOSIS — E559 Vitamin D deficiency, unspecified: Secondary | ICD-10-CM | POA: Diagnosis not present

## 2022-12-29 DIAGNOSIS — Z0000 Encounter for general adult medical examination without abnormal findings: Secondary | ICD-10-CM

## 2022-12-29 DIAGNOSIS — E538 Deficiency of other specified B group vitamins: Secondary | ICD-10-CM | POA: Diagnosis not present

## 2022-12-29 DIAGNOSIS — Z23 Encounter for immunization: Secondary | ICD-10-CM

## 2022-12-29 DIAGNOSIS — E785 Hyperlipidemia, unspecified: Secondary | ICD-10-CM | POA: Diagnosis not present

## 2022-12-29 LAB — COMPREHENSIVE METABOLIC PANEL
ALT: 15 U/L (ref 0–35)
AST: 12 U/L (ref 0–37)
Albumin: 3.9 g/dL (ref 3.5–5.2)
Alkaline Phosphatase: 104 U/L (ref 39–117)
BUN: 16 mg/dL (ref 6–23)
CO2: 26 meq/L (ref 19–32)
Calcium: 8.9 mg/dL (ref 8.4–10.5)
Chloride: 102 meq/L (ref 96–112)
Creatinine, Ser: 0.72 mg/dL (ref 0.40–1.20)
GFR: 85.35 mL/min (ref >60.00)
Glucose, Bld: 116 mg/dL — ABNORMAL HIGH (ref 70–99)
Potassium: 3.9 meq/L (ref 3.5–5.1)
Sodium: 139 meq/L (ref 135–145)
Total Bilirubin: 0.4 mg/dL (ref 0.2–1.2)
Total Protein: 6.9 g/dL (ref 6.0–8.3)

## 2022-12-29 LAB — LIPID PANEL
Cholesterol: 235 mg/dL — ABNORMAL HIGH (ref 0–200)
HDL: 72.7 mg/dL (ref >39.00)
LDL Cholesterol: 130 mg/dL — ABNORMAL HIGH (ref 0–99)
NonHDL: 162.68
Total CHOL/HDL Ratio: 3
Triglycerides: 163 mg/dL — ABNORMAL HIGH (ref 0.0–149.0)
VLDL: 32.6 mg/dL (ref 0.0–40.0)

## 2022-12-29 LAB — HEMOGLOBIN A1C: Hgb A1c MFr Bld: 6.2 % (ref 4.6–6.5)

## 2022-12-29 LAB — CBC WITH DIFFERENTIAL/PLATELET
Basophils Absolute: 0.1 10*3/uL (ref 0.0–0.1)
Basophils Relative: 0.9 % (ref 0.0–3.0)
Eosinophils Absolute: 0.2 10*3/uL (ref 0.0–0.7)
Eosinophils Relative: 2.5 % (ref 0.0–5.0)
HCT: 42.9 % (ref 36.0–46.0)
Hemoglobin: 13.8 g/dL (ref 12.0–15.0)
Lymphocytes Relative: 18.8 % (ref 12.0–46.0)
Lymphs Abs: 1.3 10*3/uL (ref 0.7–4.0)
MCHC: 32.2 g/dL (ref 30.0–36.0)
MCV: 91.7 fL (ref 78.0–100.0)
Monocytes Absolute: 0.5 10*3/uL (ref 0.1–1.0)
Monocytes Relative: 6.9 % (ref 3.0–12.0)
Neutro Abs: 5 10*3/uL (ref 1.4–7.7)
Neutrophils Relative %: 70.9 % (ref 43.0–77.0)
Platelets: 214 10*3/uL (ref 150.0–400.0)
RBC: 4.68 Mil/uL (ref 3.87–5.11)
RDW: 15.1 % (ref 11.5–15.5)
WBC: 7 10*3/uL (ref 4.0–10.5)

## 2022-12-29 LAB — TSH: TSH: 3.95 u[IU]/mL (ref 0.35–5.50)

## 2022-12-29 LAB — VITAMIN D 25 HYDROXY (VIT D DEFICIENCY, FRACTURES): VITD: 25.2 ng/mL — ABNORMAL LOW (ref 30.00–100.00)

## 2022-12-29 LAB — VITAMIN B12: Vitamin B-12: 340 pg/mL (ref 211–911)

## 2022-12-29 NOTE — Progress Notes (Signed)
Phone [phone]   Subjective:  Patient presents today for their annual physical. Chief complaint-noted.   See problem oriented charting- ROS- full  review of systems was completed and negative except for: depending on walker due to joint pains, intermittent slight palpitations- if worsens will let us know- has had for years, occasional hemorrhoidal bleed, increased thirst and urination (several sugary drinks yesterday) check a1c today, joint pain and joint swelling, neck pain and stiffness- related to neck, vertigo at times- positional and improves with rest  The following were reviewed and entered/updated in epic: Past Medical History:  Diagnosis Date   Asthma    Cancer (HCC)    uterine   Complication of anesthesia    when Ether being used- almost did not wake up from Anesthesia   DIVERTICULITIS, HX OF 05/30/2008   Diverticulosis    DIVERTICULOSIS, COLON 05/30/2008   Family history of adverse reaction to anesthesia    sister -hard to keep asleep with anesthesia   Family history of pancreatic cancer    Fx foot bone NEC-closed    GERD (gastroesophageal reflux disease)    Hypertension    Obesity    PONV (postoperative nausea and vomiting)    Psoriasis    STRESS FRACTURE, FOOT 12/05/2008   reports multiple   Venous stasis dermatitis    Patient Active Problem List   Diagnosis Date Noted   Morbid obesity (HCC) 11/30/2007    Priority: High   Hyperlipidemia, unspecified 11/19/2018    Priority: Medium    Hypothyroidism, unspecified 11/19/2018    Priority: Medium    History of uterine cancer 03/28/2014    Priority: Medium    Hyperglycemia 03/28/2014    Priority: Medium    Overactive bladder 09/24/2009    Priority: Medium    Osteopenia 12/05/2008    Priority: Medium    Essential hypertension 06/01/2007    Priority: Medium    Asthma 09/29/2006    Priority: Medium    Psoriasis 09/29/2006    Priority: Medium    B12 deficiency 11/22/2019    Priority: Low   History of  total hysterectomy 08/27/2017    Priority: Low   Osteoarthritis, knee 03/28/2014    Priority: Low   Vitamin D deficiency 11/20/2009    Priority: Low   Venous (peripheral) insufficiency 09/29/2006    Priority: Low   GERD 09/29/2006    Priority: Low   Genetic testing 07/03/2022   Family history of pancreatic cancer 06/26/2022   History of colonic polyps    Rectal bleeding    Past Surgical History:  Procedure Laterality Date   ABDOMINAL HYSTERECTOMY     complete reported including cervix. For uterine cancer. released by GYN.    APPENDECTOMY     arthroscopic of knee     CHOLECYSTECTOMY     COLONOSCOPY WITH PROPOFOL N/A 11/04/2018   Procedure: COLONOSCOPY WITH PROPOFOL;  Surgeon: Rachael Fee, MD;  Location: [facility];  Service: Endoscopy;  Laterality: N/A;   FLEXIBLE SIGMOIDOSCOPY N/A 07/12/2020   Procedure: FLEXIBLE SIGMOIDOSCOPY;  Surgeon: Rachael Fee, MD;  Location: [facility];  Service: Endoscopy;  Laterality: N/A;   TONSILLECTOMY      Family History  Problem Relation Age of Onset   Hypertension Mother    Pancreatic cancer Mother        d. 25s   Heart disease Father        MI in late 17s, early 42s, smoker   Arrhythmia Sister        bradycardia  led to pacemaker   Cancer Maternal Uncle        NOS   Cancer Paternal Aunt        NOS   Stroke Maternal Grandmother    Heart attack Maternal Grandfather        thinks in 68s   Stroke Paternal Grandmother    Tuberculosis Paternal Grandmother    Heart attack Paternal Grandfather    Colon cancer Neg Hx    Esophageal cancer Neg Hx     Medications- reviewed and updated Current Outpatient Medications  Medication Sig Dispense Refill   acetaminophen (TYLENOL) 500 MG tablet Take 2,000 mg by mouth daily as needed for moderate pain or headache.      albuterol (VENTOLIN HFA) 108 (90 Base) MCG/ACT inhaler Inhale 2 puffs into the lungs every 6 (six) hours as needed. For shortness of breath 18 g 5   Calcium  Carb-Cholecalciferol (CALCIUM-VITAMIN D) 600-400 MG-UNIT TABS Take 1 tablet by mouth daily.     diphenhydrAMINE (BENADRYL) 25 mg capsule Take 25 mg by mouth daily as needed for allergies.     enalapril (VASOTEC) 5 MG tablet Take 1 tablet (5 mg total) by mouth daily. 90 tablet 3   hydrocortisone (ANUSOL-HC) 25 MG suppository Place 1 suppository (25 mg total) rectally daily. Place 1 suppository daily for 2 weeks, then PRN only. 30 suppository 6   levothyroxine (SYNTHROID) 50 MCG tablet Take 1 tablet (50 mcg total) by mouth daily. 90 tablet 3   Menthol, Topical Analgesic, (ICY HOT EX) Apply 1 application topically daily as needed (knee pain).     Multiple Vitamins-Minerals (MULTIVITAMIN WITH MINERALS) tablet Take 1 tablet by mouth daily.     Tetrahydrozoline HCl (REDNESS RELIEVER EYE DROPS OP) Place 1 drop into both eyes daily as needed (redness).     vitamin B-12 (CYANOCOBALAMIN) 1000 MCG tablet Take 1,000 mcg by mouth daily.     Vitamin D, Ergocalciferol, (DRISDOL) 1.25 MG (50000 UNIT) CAPS capsule Take 1 capsule (50,000 Units total) by mouth every 7 (seven) days. 13 capsule 1   No current facility-administered medications for this visit.    Allergies-reviewed and updated Allergies  Allergen Reactions   Aspirin     Gi upset   Ibuprofen     GI upset   Losartan Potassium-Hctz     itching   Peg 3350-Kcl-Nabcb-Nacl-Nasulf Nausea And Vomiting    Vomiting (golytely)   Strawberry Extract Hives and Itching   Adhesive [Tape] Rash    Social History   Social History Narrative   Married (patient of Dr. 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, CPU games   Objective  Objective:  BP 136/88   Pulse 68   Temp (!) 97 F (36.1 C)   Ht 4\' 10"  (1.473 m)   Wt (!) 308 lb 6.4 oz (139.9 kg)   SpO2 99%   BMI 64.46 kg/m  Gen: NAD, resting comfortably HEENT: Mucous membranes are moist. Oropharynx normal other than poor dentition  Neck: no  thyromegaly CV: RRR no murmurs rubs or gallops Lungs: CTAB no crackles, wheeze, rhonchi Abdomen: soft/nontender/nondistended/normal bowel sounds. No rebound or guarding.  Ext: 1+ pitting edema Skin: warm, dry Neuro: grossly normal, moves all extremities, PERRLA   Assessment and Plan   66 y.o. female presenting for annual physical.  Health Maintenance counseling: 1. Anticipatory guidance: Patient counseled regarding regular dental exams - encouraged q6 months- does not go, eye  exams  yearly,  avoiding smoking and second hand smoke , limiting alcohol to 1 beverage per day , no illicit drugs .   2. Risk factor reduction:  Advised patient of need for regular exercise and diet rich and fruits and vegetables to reduce risk of heart attack and stroke.  Exercise- exercise limited by pains and mobility.  Diet/weight management-significant weight gain since loss of husband- up 27 lbs since last year - a lot of stress eating after loss- she is hoping to turn this around soon.  Wt Readings from Last 3 Encounters:  12/29/22 (!) 308 lb 6.4 oz (139.9 kg)  05/28/22 276 lb 6.4 oz (125.4 kg)  11/26/21 281 lb 3.2 oz (127.6 kg)  3. Immunizations/screenings/ancillary studies- flu shot today. Discussed COVID and RSV at pharmacy- she is leaning away from these despite risks. Prevnar 20 recommend next year  Immunization History  Administered Date(s) Administered   Fluad Quad(high Dose 65+) 11/19/2018, 11/22/2020, 11/26/2021   Fluad Trivalent(High Dose 65+) 12/29/2022   Influenza Split 12/30/2010, 11/25/2011   Influenza Whole 11/17/2008, 11/20/2009   Influenza, High Dose Seasonal PF 11/22/2019   Influenza,inj,Quad PF,6+ Mos 11/02/2012, 04/03/2015, 03/13/2016, 10/29/2017   Influenza-Unspecified 11/22/2019   PFIZER(Purple Top)SARS-COV-2 Vaccination 03/31/2019, 04/25/2019, 02/14/2020, 04/01/2021   Pneumococcal Polysaccharide-23 01/06/2019   Td 02/17/2001   Tdap 08/09/2012   Zoster Recombinant(Shingrix)  04/01/2021, 06/01/2021  4. Cervical cancer screening- past age based screening recs- no blood or discharge. History of uterine cancer- had removal including cervix- was told no further pap smears. Ovaries also removed. released from GYN 5. Breast cancer screening-  mammogram last done January 30 2022 - will do later this year 6. Colon cancer screening - last done 11/04/2018 wth a 10-year repeat planned 7. Skin cancer screening- saw derm for cyst in scalp- was not cancerous and can leave alone.  no regular screening planned. advised regular sunscreen use. Denies worrisome, changing, or new skin lesions.   8. Birth control/STD check- not active  and hysterectomy 9. Osteoporosis screening at 65- no record on file - patient had to get under 250 to have this done - still does not qualify 10. Smoking associated screening - never smoker  Status of chronic or acute concerns   #hypertension S: medication: Enalapril 5 mg- has not been taking lately Home readings #s: not checking BP Readings from Last 3 Encounters:  12/29/22 136/88  05/28/22 (!) 144/76  11/26/21 130/78  A/P: blood pressure is running higher- she agrees to restart the enalapril- thankfully not severely elevated   #hyperlipidemia S: Medication:None-has wanted to work on weight loss instead of starting medicine and financial constraints have prevented CT cardiac scoring Lab Results  Component Value Date   CHOL 212 (H) 11/26/2021   HDL 67.40 11/26/2021   LDLCALC 117 (H) 11/26/2021   LDLDIRECT 118.0 04/03/2015   TRIG 137.0 11/26/2021   CHOLHDL 3 11/26/2021   A/P: update lipids- still declines CT calcium due to finances and statin due to preference  #hypothyroidism S: compliant On thyroid medication-levothyroxine 50 mcg Lab Results  Component Value Date   TSH 5.12 05/28/2022  A/P:hopefully stable- update tsh today. Continue current meds for now    # B12 deficiency S: Current treatment/medication (oral vs. IM): 1000 mcg daily-  prior injections but cost concern A/P: hopefully stable- update b12 today. Continue current meds for now    #Vitamin D deficiency S: Medication: Calcium 600 mg 2 tablets daily and 10,000 units weekly of vitamin D A/P: hopefully stable- update vitamin d  today. Continue current meds for now     # Hyperglycemia/insulin resistance/prediabetes- a1c as high as 5.8 in 2020 prior to weight loss S:  Medication: none Exercise and diet-  a1c improving with weight loss- exercise limited  A/P: with weight up- check a1c today and work on reversing  #asthma- albuterol available if needed- very sparing - not needing lately   Recommended follow up: Return in about 6 months (around 06/28/2023) for followup or sooner if needed.Schedule b4 you leave.  Lab/Order associations: fasting   ICD-10-CM   1. Need for influenza vaccination  Z23 Flu Vaccine Trivalent High Dose (Fluad)    2. Preventative health care  Z00.00     3. Essential hypertension  I10 Comprehensive metabolic panel    CBC with Differential/Platelet    Lipid panel    4. Hyperglycemia  R73.9 Hemoglobin A1c    5. Screening for diabetes mellitus  Z13.1 Hemoglobin A1c    6. Hyperlipidemia, unspecified hyperlipidemia type  E78.5 Comprehensive metabolic panel    CBC with Differential/Platelet    Lipid panel    7. Hypothyroidism, unspecified type  E03.9 TSH    8. B12 deficiency  E53.8 Vitamin B12    9. Vitamin D deficiency  E55.9 VITAMIN D 25 Hydroxy (Vit-D Deficiency, Fractures)      No orders of the defined types were placed in this encounter.   Return precautions advised.  Tana Conch, MD

## 2022-12-29 NOTE — Patient Instructions (Addendum)
Health Maintenance Due  Topic Date Due   Medicare Annual Wellness (AWV)  02/01/2021  You are eligible to schedule your annual wellness visit with our nurse specialist Inetta Fermo.  Please consider scheduling this before you leave today  Restart enalapril 5 mg  Please stop by lab before you go If you have mychart- we will send your results within 3 business days of [place] receiving them.  If you do not have mychart- we will call you about results within 5 business days of [place] receiving them.  *please also note that you will see labs on mychart as soon as they post. I will later go in and write notes on them- will say "notes from Dr. Durene Cal"   Recommended follow up: Return in about 6 months (around 06/28/2023) for followup or sooner if needed.Schedule b4 you leave.

## 2023-02-05 DIAGNOSIS — Z1231 Encounter for screening mammogram for malignant neoplasm of breast: Secondary | ICD-10-CM | POA: Diagnosis not present

## 2023-02-05 LAB — HM MAMMOGRAPHY

## 2023-06-29 ENCOUNTER — Ambulatory Visit (INDEPENDENT_AMBULATORY_CARE_PROVIDER_SITE_OTHER): Payer: PPO | Admitting: Family Medicine

## 2023-06-29 ENCOUNTER — Ambulatory Visit (INDEPENDENT_AMBULATORY_CARE_PROVIDER_SITE_OTHER)

## 2023-06-29 ENCOUNTER — Encounter: Payer: Self-pay | Admitting: Family Medicine

## 2023-06-29 VITALS — BP 124/84 | HR 69 | Temp 97.2°F | Ht <= 58 in | Wt 315.2 lb

## 2023-06-29 DIAGNOSIS — I10 Essential (primary) hypertension: Secondary | ICD-10-CM | POA: Diagnosis not present

## 2023-06-29 DIAGNOSIS — E039 Hypothyroidism, unspecified: Secondary | ICD-10-CM

## 2023-06-29 DIAGNOSIS — E538 Deficiency of other specified B group vitamins: Secondary | ICD-10-CM | POA: Diagnosis not present

## 2023-06-29 DIAGNOSIS — R079 Chest pain, unspecified: Secondary | ICD-10-CM

## 2023-06-29 DIAGNOSIS — E559 Vitamin D deficiency, unspecified: Secondary | ICD-10-CM

## 2023-06-29 DIAGNOSIS — R0602 Shortness of breath: Secondary | ICD-10-CM | POA: Diagnosis not present

## 2023-06-29 DIAGNOSIS — R739 Hyperglycemia, unspecified: Secondary | ICD-10-CM

## 2023-06-29 DIAGNOSIS — Z131 Encounter for screening for diabetes mellitus: Secondary | ICD-10-CM

## 2023-06-29 DIAGNOSIS — E785 Hyperlipidemia, unspecified: Secondary | ICD-10-CM | POA: Diagnosis not present

## 2023-06-29 LAB — COMPREHENSIVE METABOLIC PANEL WITH GFR
ALT: 17 U/L (ref 0–35)
AST: 15 U/L (ref 0–37)
Albumin: 3.9 g/dL (ref 3.5–5.2)
Alkaline Phosphatase: 103 U/L (ref 39–117)
BUN: 10 mg/dL (ref 6–23)
CO2: 30 meq/L (ref 19–32)
Calcium: 9.2 mg/dL (ref 8.4–10.5)
Chloride: 100 meq/L (ref 96–112)
Creatinine, Ser: 0.69 mg/dL (ref 0.40–1.20)
GFR: 88.28 mL/min (ref >60.00)
Glucose, Bld: 121 mg/dL — ABNORMAL HIGH (ref 70–99)
Potassium: 3.8 meq/L (ref 3.5–5.1)
Sodium: 141 meq/L (ref 135–145)
Total Bilirubin: 0.6 mg/dL (ref 0.2–1.2)
Total Protein: 7.4 g/dL (ref 6.0–8.3)

## 2023-06-29 LAB — CBC WITH DIFFERENTIAL/PLATELET
Basophils Absolute: 0.1 10*3/uL (ref 0.0–0.1)
Basophils Relative: 1.1 % (ref 0.0–3.0)
Eosinophils Absolute: 0.1 10*3/uL (ref 0.0–0.7)
Eosinophils Relative: 1.7 % (ref 0.0–5.0)
HCT: 44.9 % (ref 36.0–46.0)
Hemoglobin: 14.6 g/dL (ref 12.0–15.0)
Lymphocytes Relative: 15 % (ref 12.0–46.0)
Lymphs Abs: 1 10*3/uL (ref 0.7–4.0)
MCHC: 32.5 g/dL (ref 30.0–36.0)
MCV: 91.3 fl (ref 78.0–100.0)
Monocytes Absolute: 0.4 10*3/uL (ref 0.1–1.0)
Monocytes Relative: 5.8 % (ref 3.0–12.0)
Neutro Abs: 5.1 10*3/uL (ref 1.4–7.7)
Neutrophils Relative %: 76.4 % (ref 43.0–77.0)
Platelets: 206 10*3/uL (ref 150.0–400.0)
RBC: 4.92 Mil/uL (ref 3.87–5.11)
RDW: 14.9 % (ref 11.5–15.5)
WBC: 6.7 10*3/uL (ref 4.0–10.5)

## 2023-06-29 LAB — TSH: TSH: 4.31 u[IU]/mL (ref 0.35–5.50)

## 2023-06-29 LAB — VITAMIN D 25 HYDROXY (VIT D DEFICIENCY, FRACTURES): VITD: 23.91 ng/mL — ABNORMAL LOW (ref 30.00–100.00)

## 2023-06-29 LAB — HEMOGLOBIN A1C: Hgb A1c MFr Bld: 6.2 % (ref 4.6–6.5)

## 2023-06-29 LAB — VITAMIN B12: Vitamin B-12: 301 pg/mL (ref 211–911)

## 2023-06-29 MED ORDER — ENALAPRIL MALEATE 5 MG PO TABS: 5.0000 mg | ORAL_TABLET | Freq: Every day | ORAL | 3 refills | Status: AC

## 2023-06-29 MED ORDER — FLUTICASONE-SALMETEROL 250-50 MCG/ACT IN AEPB
1.0000 | INHALATION_SPRAY | Freq: Two times a day (BID) | RESPIRATORY_TRACT | 5 refills | Status: AC
Start: 2023-06-29 — End: ?

## 2023-06-29 MED ORDER — ALBUTEROL SULFATE HFA 108 (90 BASE) MCG/ACT IN AERS: 2.0000 | INHALATION_SPRAY | Freq: Four times a day (QID) | RESPIRATORY_TRACT | 5 refills | Status: AC | PRN

## 2023-06-29 NOTE — Patient Instructions (Addendum)
 Please stop by lab before you go AND x-ray today If you have mychart- we will send your results within 3 business days of us  receiving them.  If you do not have mychart- we will call you about results within 5 business days of us  receiving them.  *please also note that you will see labs on mychart as soon as they post. I will later go in and write notes on them- will say "notes from Dr. Arlene Ben"   We have placed a referral for you today for echocardiogram and cardiology consult- please call their # if you do not hear within a week (may be listed below or you may see mychart message within a few days with #).   If chest pain or shortness of breath worsen despite our efforts of adding the wixela for asthma please seek care immediately by calling 911. Plus albuterol  as needed  For heat rash under breasts I've had a person recommend strongly End IT ointment-  [address] [place], [place], [place] [address] at [facility]  Recommended follow up: Return in about 6 weeks (around 08/10/2023) for followup or sooner if needed.Schedule b4 you leave. MORE than happy to see you sooner but really want to see what cardiology thinks and collect more information
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
## 2023-07-01 ENCOUNTER — Ambulatory Visit: Payer: Self-pay

## 2023-08-10 ENCOUNTER — Ambulatory Visit: Admitting: Family Medicine

## 2023-08-12 ENCOUNTER — Ambulatory Visit (HOSPITAL_COMMUNITY)
Admission: RE | Admit: 2023-08-12 | Discharge: 2023-08-12 | Disposition: A | Discharge status: Home / Self Care | Source: Ambulatory Visit | Attending: Cardiology | Admitting: Cardiology

## 2023-08-12 DIAGNOSIS — R0602 Shortness of breath: Secondary | ICD-10-CM | POA: Diagnosis not present

## 2023-08-12 LAB — ECHOCARDIOGRAM COMPLETE
Area-P 1/2: 3.42 cm2
S' Lateral: 2.8 cm

## 2023-08-27 ENCOUNTER — Ambulatory Visit: Admitting: Family Medicine

## 2023-10-07 ENCOUNTER — Encounter: Payer: Self-pay | Admitting: Family Medicine

## 2023-10-07 ENCOUNTER — Ambulatory Visit (INDEPENDENT_AMBULATORY_CARE_PROVIDER_SITE_OTHER): Admitting: Family Medicine

## 2023-10-07 VITALS — BP 122/82 | HR 102 | Temp 97.4°F | Ht <= 58 in | Wt 298.0 lb

## 2023-10-07 DIAGNOSIS — J4540 Moderate persistent asthma, uncomplicated: Secondary | ICD-10-CM

## 2023-10-07 DIAGNOSIS — E039 Hypothyroidism, unspecified: Secondary | ICD-10-CM | POA: Diagnosis not present

## 2023-10-07 DIAGNOSIS — I10 Essential (primary) hypertension: Secondary | ICD-10-CM

## 2023-10-07 NOTE — Progress Notes (Signed)
 Phone [phone] In person visit   Subjective:   Melissa Kim is a 68 y.o. year old very pleasant female patient who presents for/with See problem oriented charting Chief Complaint  Patient presents with   Hyperlipidemia   Hypertension    Past Medical History-  Patient Active Problem List   Diagnosis Date Noted   Morbid obesity (HCC) 11/30/2007    Priority: High   Hyperlipidemia, unspecified 11/19/2018    Priority: Medium    Hypothyroidism, unspecified 11/19/2018    Priority: Medium    History of uterine cancer 03/28/2014    Priority: Medium    Hyperglycemia 03/28/2014    Priority: Medium    Overactive bladder 09/24/2009    Priority: Medium    Osteopenia 12/05/2008    Priority: Medium    Essential hypertension 06/01/2007    Priority: Medium    Asthma 09/29/2006    Priority: Medium    Psoriasis 09/29/2006    Priority: Medium    Genetic testing 07/03/2022    Priority: Low   Family history of pancreatic cancer 06/26/2022    Priority: Low   B12 deficiency 11/22/2019    Priority: Low   History of colonic polyps     Priority: Low   Rectal bleeding     Priority: Low   History of total hysterectomy 08/27/2017    Priority: Low   Osteoarthritis, knee 03/28/2014    Priority: Low   Vitamin D  deficiency 11/20/2009    Priority: Low   Venous (peripheral) insufficiency 09/29/2006    Priority: Low   GERD 09/29/2006    Priority: Low    Medications- reviewed and updated Current Outpatient Medications  Medication Sig Dispense Refill   acetaminophen  (TYLENOL ) 500 MG tablet Take 2,000 mg by mouth daily as needed for moderate pain or headache.      albuterol  (VENTOLIN  HFA) 108 (90 Base) MCG/ACT inhaler Inhale 2 puffs into the lungs every 6 (six) hours as needed. For shortness of breath 18 g 5   Calcium Carb-Cholecalciferol  (CALCIUM-VITAMIN D ) 600-400 MG-UNIT TABS Take 1 tablet by mouth daily.     diphenhydrAMINE (BENADRYL) 25 mg capsule Take 25 mg by mouth daily as  needed for allergies.     enalapril  (VASOTEC ) 5 MG tablet Take 1 tablet (5 mg total) by mouth daily. 90 tablet 3   fluticasone -salmeterol (WIXELA INHUB) 250-50 MCG/ACT AEPB Inhale 1 puff into the lungs in the morning and at bedtime. 60 each 5   hydrocortisone  (ANUSOL -HC) 25 MG suppository Place 1 suppository (25 mg total) rectally daily. Place 1 suppository daily for 2 weeks, then PRN only. 30 suppository 6   Menthol, Topical Analgesic, (ICY HOT EX) Apply 1 application topically daily as needed (knee pain).     Multiple Vitamins-Minerals (MULTIVITAMIN WITH MINERALS) tablet Take 1 tablet by mouth daily.     Tetrahydrozoline HCl (REDNESS RELIEVER EYE DROPS OP) Place 1 drop into both eyes daily as needed (redness).     vitamin B-12 (CYANOCOBALAMIN ) 1000 MCG tablet Take 1,000 mcg by mouth daily.     Vitamin D , Ergocalciferol , (DRISDOL ) 1.25 MG (50000 UNIT) CAPS capsule Take 1 capsule (50,000 Units total) by mouth every 7 (seven) days. (Patient not taking: Reported on 10/07/2023) 13 capsule 1   No current facility-administered medications for this visit.     Objective:  BP 122/82 (BP Location: Left Arm, Patient Position: Sitting, Cuff Size: Normal)   Pulse (!) 102   Temp (!) 97.4 F (36.3 C) (Temporal)   Ht 4' 10 (1.473  m)   Wt 298 lb (135.2 kg)   SpO2 95%   BMI 62.28 kg/m  Gen: NAD, resting comfortably CV: High normal heart rate but regular no murmurs rubs or gallops Lungs: CTAB no crackles, wheeze, rhonchi Ext: Trace edema and venous stasis skin changes noted Skin: warm, dry     Assessment and Plan   # Shortness of breath/asthma S: At last visit patient reported about 3 months of chest pain and shortness of breath that also went along with wheezing-could be at rest or exertional.  Pain up to 4 out of 10 in the left or right side of chest near sternum.  Also experiencing palpitations.  She was also feeling fatigued. No increased swelling or weight gain at that time -Workup last visit  included reassuring CMP, CBC, TSH, chest x-ray, EKG, echocardiogram other than diastolic dysfunction.  B12 was low normal as below -We referred to cardiology - We opted to try Wixela to see if that would improve baseline asthma control  She reports breathing and wheezing has been better since starting this. Shortness of breath now limited to if really hot outside and this is big improvement.  - prior chest pain resolved- she reports got a call from cardiology finally 2 days ago  but since pain has resolved shed like to hold off - sparing albuterol  as long as using wixela  A/P: shortness of breath has largely resolved on wixela- so indicates likely an asthma related issue. Prior chest pain has resolved as well and she would like to hodl off on cardiology at this time. Continue to monitor and if worsens we can reconsider  -Also potentially improving her shortness of breath is her significant weight loss of 17 pounds-she has been able to be more active and she is try to reduce her portion sizes significantly-congratulated her on the efforts  #hypertension S: medication: Enalapril  5 mg BP Readings from Last 3 Encounters:  10/07/23 122/82  06/29/23 124/84  12/29/22 136/88  A/P: stable- continue current medicines    #hypothyroidism S: compliant On thyroid  medication-NONE in past levothyroxine  50 mcg Lab Results  Component Value Date   TSH 4.31 06/29/2023   A/P:thankfully even off thyroid  medicine- her levels have been ok but trending up slightly- keep an eye on this  # B12 deficiency S: Current treatment/medication (oral vs. IM): Prior injections but costly so doing 1000 mcg daily-last visit had missed several doses Lab Results  Component Value Date   VITAMINB12 301 06/29/2023   A/P: doing better with consistency- recheck perhaps next labs with goal at least over 400   #morbid obesity- trying to stretch meals out to get 2 meals or even 3 meals out of 1 and that portion size reduction has  been helpful  Recommended follow up: Return in about 6 months (around 04/08/2024) for physical or sooner if needed.Schedule b4 you leave.  Lab/Order associations:   ICD-10-CM   1. Moderate persistent asthma without complication  J45.40     2. Essential hypertension  I10     3. Hypothyroidism, unspecified type  E03.9       No orders of the defined types were placed in this encounter.   Return precautions advised.  Garnette Lukes, MD

## 2023-10-07 NOTE — Patient Instructions (Addendum)
 Health Maintenance Due  Topic Date Due   Medicare Annual Wellness (AWV)  02/01/2021  You are eligible to schedule your annual wellness visit with our nurse specialist [place].  Please consider scheduling this before you leave today  Get flu shot in the fall likely September or October- you can call back for nurse visit or get at pharmacy  Thrilled you are doing better- see us  back if anything changes  Recommended follow up: Return in about 6 months (around 04/08/2024) for physical or sooner if needed.Schedule b4 you leave. Labs next visit

## 2023-11-10 ENCOUNTER — Ambulatory Visit (INDEPENDENT_AMBULATORY_CARE_PROVIDER_SITE_OTHER)

## 2023-11-10 ENCOUNTER — Telehealth: Payer: Self-pay | Admitting: Pharmacist

## 2023-11-10 VITALS — Ht <= 58 in | Wt 298.0 lb

## 2023-11-10 DIAGNOSIS — Z0000 Encounter for general adult medical examination without abnormal findings: Secondary | ICD-10-CM

## 2023-11-10 NOTE — Progress Notes (Signed)
 Subjective:   Melissa Kim is a 68 y.o. who presents for a Medicare Wellness preventive visit.  As a reminder, Annual Wellness Visits don't include a physical exam, and some assessments may be limited, especially if this visit is performed virtually. We may recommend an in-person follow-up visit with your provider if needed.  Visit Complete: Virtual I connected with  Melissa Kim on 11/10/23 by a audio enabled telemedicine application and verified that I am speaking with the correct person using two identifiers.  Patient Location: Home  Provider Location: Office/Clinic  I discussed the limitations of evaluation and management by telemedicine. The patient expressed understanding and agreed to proceed.  Vital Signs: Because this visit was a virtual/telehealth visit, some criteria may be missing or patient reported. Any vitals not documented were not able to be obtained and vitals that have been documented are patient reported.  VideoDeclined- This patient declined Librarian, academic. Therefore the visit was completed with audio only.  Persons Participating in Visit: Patient.  AWV Questionnaire: No: Patient Medicare AWV questionnaire was not completed prior to this visit.  Cardiac Risk Factors include: advanced age (>61men, >25 women);dyslipidemia;hypertension;obesity (BMI >30kg/m2)     Objective:    Today's Vitals   11/10/23 1126  Weight: 298 lb (135.2 kg)  Height: 4' 10 (1.473 m)  PainSc: 4    Body mass index is 62.28 kg/m.     11/10/2023   11:35 AM 07/12/2020    8:00 AM 02/02/2020    1:57 PM 11/04/2018    8:28 AM 11/01/2018    4:04 PM  Advanced Directives  Does Patient Have a Medical Advance Directive? Yes No No No No  Type of Estate agent of [place];Living will      Copy of Healthcare Power of Attorney in Chart? No - copy requested      Would patient like information on creating a medical advance directive?   No - Patient declined Yes (MAU/Ambulatory/Procedural Areas - Information given) No - Patient declined No - Patient declined    Current Medications (verified) Outpatient Encounter Medications as of 11/10/2023  Medication Sig   acetaminophen  (TYLENOL ) 500 MG tablet Take 2,000 mg by mouth daily as needed for moderate pain or headache.    albuterol  (VENTOLIN  HFA) 108 (90 Base) MCG/ACT inhaler Inhale 2 puffs into the lungs every 6 (six) hours as needed. For shortness of breath   Calcium Carb-Cholecalciferol  (CALCIUM-VITAMIN D ) 600-400 MG-UNIT TABS Take 1 tablet by mouth daily.   diphenhydrAMINE (BENADRYL) 25 mg capsule Take 25 mg by mouth daily as needed for allergies.   enalapril  (VASOTEC ) 5 MG tablet Take 1 tablet (5 mg total) by mouth daily.   fluticasone -salmeterol (WIXELA INHUB) 250-50 MCG/ACT AEPB Inhale 1 puff into the lungs in the morning and at bedtime.   Menthol, Topical Analgesic, (ICY HOT EX) Apply 1 application topically daily as needed (knee pain).   Multiple Vitamins-Minerals (MULTIVITAMIN WITH MINERALS) tablet Take 1 tablet by mouth daily.   Tetrahydrozoline HCl (REDNESS RELIEVER EYE DROPS OP) Place 1 drop into both eyes daily as needed (redness).   vitamin B-12 (CYANOCOBALAMIN ) 1000 MCG tablet Take 1,000 mcg by mouth daily.   Vitamin D , Ergocalciferol , (DRISDOL ) 1.25 MG (50000 UNIT) CAPS capsule Take 1 capsule (50,000 Units total) by mouth every 7 (seven) days.   [DISCONTINUED] hydrocortisone  (ANUSOL -HC) 25 MG suppository Place 1 suppository (25 mg total) rectally daily. Place 1 suppository daily for 2 weeks, then PRN only.   No facility-administered  encounter medications on file as of 11/10/2023.    Allergies (verified) Aspirin, Ibuprofen, Losartan potassium-hctz, Peg 3350 -kcl-nabcb-nacl-nasulf, Strawberry extract, and Adhesive [tape]   History: Past Medical History:  Diagnosis Date   Asthma    Cancer (HCC)    uterine   Complication of anesthesia    when Ether being used-  almost did not wake up from Anesthesia   DIVERTICULITIS, HX OF 05/30/2008   Diverticulosis    DIVERTICULOSIS, COLON 05/30/2008   Family history of adverse reaction to anesthesia    sister -hard to keep asleep with anesthesia   Family history of pancreatic cancer    Fx foot bone NEC-closed    GERD (gastroesophageal reflux disease)    Hypertension    Obesity    PONV (postoperative nausea and vomiting)    Psoriasis    STRESS FRACTURE, FOOT 12/05/2008   reports multiple   Venous stasis dermatitis    Past Surgical History:  Procedure Laterality Date   ABDOMINAL HYSTERECTOMY     complete reported including cervix. For uterine cancer. released by GYN.    APPENDECTOMY     arthroscopic of knee     CHOLECYSTECTOMY     COLONOSCOPY WITH PROPOFOL  N/A 11/04/2018   Procedure: COLONOSCOPY WITH PROPOFOL ;  Surgeon: Teressa Toribio SQUIBB, MD;  Location: [facility];  Service: Endoscopy;  Laterality: N/A;   FLEXIBLE SIGMOIDOSCOPY N/A 07/12/2020   Procedure: FLEXIBLE SIGMOIDOSCOPY;  Surgeon: Teressa Toribio SQUIBB, MD;  Location: [facility];  Service: Endoscopy;  Laterality: N/A;   TONSILLECTOMY     Family History  Problem Relation Age of Onset   Hypertension Mother    Pancreatic cancer Mother        d. 8s   Heart disease Father        MI in late 20s, early 62s, smoker   Arrhythmia Sister        bradycardia led to pacemaker   Cancer Maternal Uncle        NOS   Cancer Paternal Aunt        NOS   Stroke Maternal Grandmother    Heart attack Maternal Grandfather        thinks in 66s   Stroke Paternal Grandmother    Tuberculosis Paternal Grandmother    Heart attack Paternal Grandfather    Colon cancer Neg Hx    Esophageal cancer Neg Hx    Social History   Socioeconomic History   Marital status: Widowed    Spouse name: Not on file   Number of children: Not on file   Years of education: Not on file   Highest education level: Not on file  Occupational History   Occupation: retired  Tobacco Use    Smoking status: Never   Smokeless tobacco: Never  Vaping Use   Vaping status: Never Used  Substance and Sexual Activity   Alcohol use: No   Drug use: No   Sexual activity: Not on file  Other Topics Concern   Not on file  Social History Narrative   Married (patient of Dr. 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, CPU games   Social Drivers of Health   Financial Resource Strain: Low Risk  (11/10/2023)   Overall Financial Resource Strain (CARDIA)    Difficulty of Paying Living Expenses: Not hard at all  Food Insecurity: No Food Insecurity (11/10/2023)   Hunger Vital Sign    Worried About Running Out of Food  in the Last Year: Never true    Ran Out of Food in the Last Year: Never true  Transportation Needs: No Transportation Needs (11/10/2023)   PRAPARE - Administrator, Civil Service (Medical): No    Lack of Transportation (Non-Medical): No  Physical Activity: Inactive (11/10/2023)   Exercise Vital Sign    Days of Exercise per Week: 0 days    Minutes of Exercise per Session: 0 min  Stress: No Stress Concern Present (11/10/2023)   [employer] of Occupational Health - Occupational Stress Questionnaire    Feeling of Stress: Only a little  Social Connections: Moderately Isolated (11/10/2023)   Social Connection and Isolation Panel    Frequency of Communication with Friends and Family: More than three times a week    Frequency of Social Gatherings with Friends and Family: Twice a week    Attends Religious Services: 1 to 4 times per year    Active Member of [employer] or Organizations: No    Attends Banker Meetings: Never    Marital Status: Widowed    Tobacco Counseling Counseling given: Not Answered    Clinical Intake:  Pre-visit preparation completed: Yes  Pain : 0-10 Pain Score: 4  Pain Type: Chronic pain Pain Location: Knee Pain Orientation: Left, Right Pain Descriptors / Indicators: Aching,  Sore Pain Onset: More than a month ago Pain Frequency: Constant     BMI - recorded: 62.28 Nutritional Status: BMI > 30  Obese Nutritional Risks: None Diabetes: No  Lab Results  Component Value Date   HGBA1C 6.2 06/29/2023   HGBA1C 6.2 12/29/2022   HGBA1C 5.9 05/28/2022     How often do you need to have someone help you when you read instructions, pamphlets, or other written materials from your doctor or pharmacy?: 1 - Never  Interpreter Needed?: No  Information entered by :: Ellouise Haws, LPN   Activities of Daily Living     11/10/2023   11:28 AM  In your present state of health, do you have any difficulty performing the following activities:  Hearing? 0  Vision? 0  Difficulty concentrating or making decisions? 0  Walking or climbing stairs? 1  Comment knees  Dressing or bathing? 0  Doing errands, shopping? 0  Preparing Food and eating ? N  Using the Toilet? N  In the past six months, have you accidently leaked urine? Y  Comment urgency  Do you have problems with loss of bowel control? N  Managing your Medications? N  Managing your Finances? N  Housekeeping or managing your Housekeeping? N    Patient Care Team: Katrinka Garnette KIDD, MD as PCP - General (Family Medicine) Teressa Toribio SQUIBB, MD (Inactive) as Attending Physician (Gastroenterology) Mammography, [facility] (Diagnostic Radiology)  I have updated your Care Teams any recent Medical Services you may have received from other providers in the past year.     Assessment:   This is a routine wellness examination for Mady.  Hearing/Vision screen Hearing Screening - Comments:: Pt denies any hearing issues  Vision Screening - Comments:: Wears rx glasses - up to date with routine eye exams [facility]    Goals Addressed             This Visit's Progress    Patient Stated       Be more active        Depression Screen     11/10/2023   11:32 AM 10/07/2023   10:04 AM  06/29/2023     9:47 AM 05/28/2022   10:06 AM 11/26/2021   11:04 AM 11/22/2020   10:43 AM 05/22/2020   11:15 AM  PHQ 2/9 Scores  PHQ - 2 Score 1 0 1 0 0 0 0  PHQ- 9 Score  3 2 0 0      Fall Risk     11/10/2023   11:35 AM 10/07/2023   10:04 AM 05/28/2022   10:06 AM 11/26/2021   11:05 AM 11/22/2020   10:43 AM  Fall Risk   Falls in the past year? 0 0 0 0 1  Number falls in past yr: 0 0 0 0 0  Injury with Fall? 0 0 0 0 1  Risk for fall due to : Impaired balance/gait;Impaired mobility No Fall Risks No Fall Risks History of fall(s) Impaired balance/gait;Impaired mobility;History of fall(s)  Follow up Falls prevention discussed Falls evaluation completed Falls evaluation completed Falls evaluation completed       Data saved with a previous flowsheet row definition    MEDICARE RISK AT HOME:  Medicare Risk at Home Any stairs in or around the home?: Yes If so, are there any without handrails?: No Home free of loose throw rugs in walkways, pet beds, electrical cords, etc?: Yes Adequate lighting in your home to reduce risk of falls?: Yes Life alert?: No Use of a cane, walker or w/c?: Yes (walker) Grab bars in the bathroom?: Yes Shower chair or bench in shower?: Yes Elevated toilet seat or a handicapped toilet?: No  TIMED UP AND GO:  Was the test performed?  No  Cognitive Function: 6CIT completed        11/10/2023   11:36 AM 02/02/2020    2:01 PM  6CIT Screen  What Year? 0 points 0 points  What month? 0 points 0 points  What time? 0 points   Count back from 20 0 points 0 points  Months in reverse 0 points 0 points  Repeat phrase 0 points 2 points  Total Score 0 points     Immunizations Immunization History  Administered Date(s) Administered   Fluad Quad(high Dose 65+) 11/19/2018, 11/22/2020, 11/26/2021   Fluad Trivalent(High Dose 65+) 12/29/2022   INFLUENZA, HIGH DOSE SEASONAL PF 11/22/2019   Influenza Split 12/30/2010, 11/25/2011   Influenza Whole 11/17/2008, 11/20/2009    Influenza,inj,Quad PF,6+ Mos 11/02/2012, 04/03/2015, 03/13/2016, 10/29/2017   Influenza-Unspecified 11/22/2019   PFIZER(Purple Top)SARS-COV-2 Vaccination 03/31/2019, 04/25/2019, 02/14/2020, 04/01/2021   Pneumococcal Polysaccharide-23 01/06/2019   Td 02/17/2001   Tdap 08/09/2012   Zoster Recombinant(Shingrix) 04/01/2021, 06/01/2021    Screening Tests Health Maintenance  Topic Date Due   COVID-19 Vaccine (5 - 2025-26 season) 10/19/2023   DTaP/Tdap/Td (3 - Td or Tdap) 12/29/2023 (Originally 08/10/2022)   Influenza Vaccine  05/17/2024 (Originally 09/18/2023)   Pneumococcal Vaccine: 50+ Years (2 of 2 - PCV) 10/06/2024 (Originally 01/06/2020)   DEXA SCAN  10/06/2024 (Originally 08/23/2018)   Medicare Annual Wellness (AWV)  11/09/2024   Mammogram  02/04/2025   Colonoscopy  11/03/2028   Hepatitis C Screening  Completed   Zoster Vaccines- Shingrix  Completed   HPV VACCINES  Aged Out   Meningococcal B Vaccine  Aged Out    Health Maintenance Items Addressed: See Nurse Notes at the end of this note  Additional Screening:  Vision Screening: Recommended annual ophthalmology exams for early detection of glaucoma and other disorders of the eye. Is the patient up to date with their annual eye exam?  Yes  Who is the  provider or what is the name of the office in which the patient attends annual eye exams? Dr [facility]   Dental Screening: Recommended annual dental exams for proper oral hygiene  Community Resource Referral / Chronic Care Management: CRR required this visit?  No   CCM required this visit?  No   Plan:    I have personally reviewed and noted the following in the patient's chart:   Medical and social history Use of alcohol, tobacco or illicit drugs  Current medications and supplements including opioid prescriptions. Patient is not currently taking opioid prescriptions. Functional ability and status Nutritional status Physical activity Advanced directives List of  other physicians Hospitalizations, surgeries, and ER visits in previous 12 months Vitals Screenings to include cognitive, depression, and falls Referrals and appointments  In addition, I have reviewed and discussed with patient certain preventive protocols, quality metrics, and best practice recommendations. A written personalized care plan for preventive services as well as general preventive health recommendations were provided to patient.   Ellouise VEAR Haws, LPN   [id number]   After Visit Summary: (MyChart) Due to this being a telephonic visit, the after visit summary with patients personalized plan was offered to patient via MyChart   Notes: Nothing significant to report at this time.

## 2023-11-10 NOTE — Progress Notes (Addendum)
 Pharmacy Quality Measure Review  This patient is appearing on a report for being at risk of failing the adherence measure for hypertension (ACEi/ARB) medications this calendar year.   Medication: enalapril  Last fill date: 06/29/2023 for 90 day supply  Reviewed recent refill history in Dr Annemarie database. Last refill date was verified as 06/29/2023.SABRA Patient has 3 refills remaining. Next appointment with PCP is 04/29/2023  BP Readings from Last 3 Encounters:  10/07/23 122/82  06/29/23 124/84  12/29/22 136/88       Discussed barriers to adherence, which included not taking when she wakes up late because she is worried it will cause her to be up all night urinating.  Discussed keeping medication close to bed to that she take take as soon as she wakes.  Patient did states she is close to needing a refill. Offered to coordinate refill with [facility] but patient states she preferred to call herself for refill.   Madelin Ray, PharmD Clinical Pharmacist  Primary Care  Population Health [phone]   11/18/2023 - Addendum Checked to see if patient had requested refill yet for enalapril . Spoke to [facility]. Patient has not requested enalapril  refill yet.   Madelin Ray, PharmD Clinical Pharmacist [facility] Primary Care  Population Health [phone]

## 2023-11-10 NOTE — Patient Instructions (Signed)
 Ms. Slovacek,  Thank you for taking the time for your Medicare Wellness Visit. I appreciate your continued commitment to your health goals. Please review the care plan we discussed, and feel free to reach out if I can assist you further.  Medicare recommends these wellness visits once per year to help you and your care team stay ahead of potential health issues. These visits are designed to focus on prevention, allowing your provider to concentrate on managing your acute and chronic conditions during your regular appointments.  Please note that Annual Wellness Visits do not include a physical exam. Some assessments may be limited, especially if the visit was conducted virtually. If needed, we may recommend a separate in-person follow-up with your provider.  Ongoing Care Seeing your primary care provider every 3 to 6 months helps us  monitor your health and provide consistent, personalized care.   Referrals If a referral was made during today's visit and you haven't received any updates within two weeks, please contact the referred provider directly to check on the status.  Recommended Screenings:  Health Maintenance  Topic Date Due   COVID-19 Vaccine (5 - 2025-26 season) 10/19/2023   DTaP/Tdap/Td vaccine (3 - Td or Tdap) 12/29/2023*   Flu Shot  05/17/2024*   Pneumococcal Vaccine for age over 70 (2 of 2 - PCV) 10/06/2024*   DEXA scan (bone density measurement)  10/06/2024*   Medicare Annual Wellness Visit  11/09/2024   Breast Cancer Screening  02/04/2025   Colon Cancer Screening  11/03/2028   Hepatitis C Screening  Completed   Zoster (Shingles) Vaccine  Completed   HPV Vaccine  Aged Out   Meningitis B Vaccine  Aged Out  *Topic was postponed. The date shown is not the original due date.       11/10/2023   11:35 AM  Advanced Directives  Does Patient Have a Medical Advance Directive? Yes  Type of Estate agent of [place];Living will  Copy of Healthcare Power of  Attorney in Chart? No - copy requested   Advance Care Planning is important because it: Ensures you receive medical care that aligns with your values, goals, and preferences. Provides guidance to your family and loved ones, reducing the emotional burden of decision-making during critical moments.  Vision: Annual vision screenings are recommended for early detection of glaucoma, cataracts, and diabetic retinopathy. These exams can also reveal signs of chronic conditions such as diabetes and high blood pressure.  Dental: Annual dental screenings help detect early signs of oral cancer, gum disease, and other conditions linked to overall health, including heart disease and diabetes.  Please see the attached documents for additional preventive care recommendations.

## 2024-02-05 ENCOUNTER — Telehealth: Payer: Self-pay | Admitting: Pharmacist

## 2024-02-05 NOTE — Telephone Encounter (Signed)
 02/05/2024 - Addendum - patient called back. She states she has not missed enalapril  much lately. The tips we discussed previously had been helpful. She is reminded of next refill for enalapril  and follow up with PCP in March 2026.

## 2024-02-05 NOTE — Progress Notes (Signed)
 Pharmacy Quality Measure Review  This patient is appearing on a report for being at risk of failing the adherence measure for hypertension (ACEi/ARB) medications this calendar year.   Medication: enalapril  Last fill date: 11/23/2023 for 90 day supply - patient has 2 refills remaining.   She showing in December adherence report but actually failed adherence earlier in 2025. She is not yet due to refill enalapril .  She has filled enalapril  twice in 2025 - 90 days supply on 06/29/2023 and 11/23/2023.   I did outreach her in September 2025. At that time she reported that she sometimes forgot to take her enalapril  when she wakes up late. Discussed tips for remembering medications.  Tried to contact patient today but unable to reach her by phone. Wanted to check in to see if she was taking medications more regularly after our last discussion.  Will continue to follow adherence in 2026.   Madelin Ray, PharmD Clinical Pharmacist [facility] Primary Care  Population Health [phone]

## 2024-02-22 LAB — HM MAMMOGRAPHY

## 2024-02-24 ENCOUNTER — Encounter: Payer: Self-pay | Admitting: Family Medicine

## 2024-04-28 ENCOUNTER — Encounter: Admitting: Family Medicine

## 2024-11-15 ENCOUNTER — Ambulatory Visit
